# Patient Record
Sex: Male | Born: 1940 | Race: White | Hispanic: No | Smoking: Former smoker
Health system: Southern US, Community
[De-identification: ages and names within clinical notes are randomized; demographics above are authoritative.]

## PROBLEM LIST (undated history)

## (undated) DIAGNOSIS — N210 Calculus in bladder: Secondary | ICD-10-CM

## (undated) DIAGNOSIS — R011 Cardiac murmur, unspecified: Secondary | ICD-10-CM

## (undated) DIAGNOSIS — E119 Type 2 diabetes mellitus without complications: Secondary | ICD-10-CM

## (undated) DIAGNOSIS — I10 Essential (primary) hypertension: Secondary | ICD-10-CM

## (undated) HISTORY — DX: Calculus in bladder: N21.0

## (undated) HISTORY — DX: Essential (primary) hypertension: I10

## (undated) HISTORY — PX: CATARACT EXTRACTION, BILATERAL: SHX1313

## (undated) HISTORY — PX: EYE SURGERY: SHX253

## (undated) HISTORY — DX: Type 2 diabetes mellitus without complications: E11.9

---

## 1995-04-01 HISTORY — PX: CHOLECYSTECTOMY: SHX55

## 2004-03-21 LAB — HM COLONOSCOPY: HM COLON: NORMAL

## 2004-04-03 ENCOUNTER — Other Ambulatory Visit: Payer: Self-pay

## 2007-06-25 ENCOUNTER — Ambulatory Visit: Payer: Self-pay | Admitting: Family Medicine

## 2007-12-28 ENCOUNTER — Ambulatory Visit: Payer: Self-pay | Admitting: Family Medicine

## 2009-02-15 ENCOUNTER — Ambulatory Visit: Payer: Self-pay | Admitting: Family Medicine

## 2009-09-18 ENCOUNTER — Ambulatory Visit: Payer: Self-pay | Admitting: Family Medicine

## 2010-07-29 LAB — PSA: PSA: 4.6

## 2010-12-10 ENCOUNTER — Ambulatory Visit: Payer: Self-pay

## 2011-01-23 ENCOUNTER — Ambulatory Visit: Payer: Self-pay | Admitting: Anesthesiology

## 2011-01-28 ENCOUNTER — Ambulatory Visit: Payer: Self-pay | Admitting: General Surgery

## 2011-01-28 HISTORY — PX: LIPOMA EXCISION: SHX5283

## 2011-01-30 LAB — PATHOLOGY REPORT

## 2011-07-28 ENCOUNTER — Ambulatory Visit: Payer: Self-pay | Admitting: Family Medicine

## 2012-07-26 ENCOUNTER — Ambulatory Visit: Payer: Self-pay | Admitting: Family Medicine

## 2013-08-15 ENCOUNTER — Ambulatory Visit: Payer: Self-pay | Admitting: Family Medicine

## 2014-07-10 ENCOUNTER — Ambulatory Visit: Payer: Self-pay | Admitting: Gastroenterology

## 2014-07-11 LAB — PATHOLOGY REPORT

## 2014-08-29 ENCOUNTER — Ambulatory Visit: Payer: Self-pay | Admitting: Family Medicine

## 2014-12-28 DIAGNOSIS — I10 Essential (primary) hypertension: Secondary | ICD-10-CM | POA: Diagnosis not present

## 2014-12-28 DIAGNOSIS — E1140 Type 2 diabetes mellitus with diabetic neuropathy, unspecified: Secondary | ICD-10-CM | POA: Diagnosis not present

## 2015-01-13 DIAGNOSIS — E119 Type 2 diabetes mellitus without complications: Secondary | ICD-10-CM | POA: Diagnosis not present

## 2015-03-05 DIAGNOSIS — E119 Type 2 diabetes mellitus without complications: Secondary | ICD-10-CM | POA: Diagnosis not present

## 2015-03-05 DIAGNOSIS — I10 Essential (primary) hypertension: Secondary | ICD-10-CM | POA: Diagnosis not present

## 2015-04-14 DIAGNOSIS — E119 Type 2 diabetes mellitus without complications: Secondary | ICD-10-CM | POA: Diagnosis not present

## 2015-05-07 ENCOUNTER — Other Ambulatory Visit: Payer: Self-pay | Admitting: Family Medicine

## 2015-07-14 DIAGNOSIS — E119 Type 2 diabetes mellitus without complications: Secondary | ICD-10-CM | POA: Diagnosis not present

## 2015-08-02 ENCOUNTER — Other Ambulatory Visit: Payer: Self-pay | Admitting: Family Medicine

## 2015-08-20 ENCOUNTER — Encounter: Payer: Self-pay | Admitting: Family Medicine

## 2015-08-20 ENCOUNTER — Telehealth: Payer: Self-pay | Admitting: Family Medicine

## 2015-08-20 ENCOUNTER — Ambulatory Visit: Admission: RE | Admit: 2015-08-20 | Payer: Medicare Other | Source: Ambulatory Visit | Admitting: Family Medicine

## 2015-08-20 ENCOUNTER — Ambulatory Visit (INDEPENDENT_AMBULATORY_CARE_PROVIDER_SITE_OTHER): Payer: Medicare Other | Admitting: Family Medicine

## 2015-08-20 VITALS — BP 132/82 | HR 71 | Temp 97.9°F | Resp 16 | Wt 192.8 lb

## 2015-08-20 DIAGNOSIS — K219 Gastro-esophageal reflux disease without esophagitis: Secondary | ICD-10-CM | POA: Insufficient documentation

## 2015-08-20 DIAGNOSIS — J449 Chronic obstructive pulmonary disease, unspecified: Secondary | ICD-10-CM | POA: Insufficient documentation

## 2015-08-20 DIAGNOSIS — M4716 Other spondylosis with myelopathy, lumbar region: Secondary | ICD-10-CM | POA: Insufficient documentation

## 2015-08-20 DIAGNOSIS — J302 Other seasonal allergic rhinitis: Secondary | ICD-10-CM | POA: Insufficient documentation

## 2015-08-20 DIAGNOSIS — I10 Essential (primary) hypertension: Secondary | ICD-10-CM | POA: Insufficient documentation

## 2015-08-20 DIAGNOSIS — B1920 Unspecified viral hepatitis C without hepatic coma: Secondary | ICD-10-CM | POA: Insufficient documentation

## 2015-08-20 DIAGNOSIS — E119 Type 2 diabetes mellitus without complications: Secondary | ICD-10-CM | POA: Insufficient documentation

## 2015-08-20 DIAGNOSIS — R609 Edema, unspecified: Secondary | ICD-10-CM | POA: Insufficient documentation

## 2015-08-20 DIAGNOSIS — M25552 Pain in left hip: Secondary | ICD-10-CM

## 2015-08-20 DIAGNOSIS — M791 Myalgia, unspecified site: Secondary | ICD-10-CM | POA: Insufficient documentation

## 2015-08-20 DIAGNOSIS — J45909 Unspecified asthma, uncomplicated: Secondary | ICD-10-CM | POA: Insufficient documentation

## 2015-08-20 DIAGNOSIS — R011 Cardiac murmur, unspecified: Secondary | ICD-10-CM | POA: Insufficient documentation

## 2015-08-20 DIAGNOSIS — M545 Low back pain, unspecified: Secondary | ICD-10-CM | POA: Insufficient documentation

## 2015-08-20 DIAGNOSIS — Z8614 Personal history of Methicillin resistant Staphylococcus aureus infection: Secondary | ICD-10-CM | POA: Insufficient documentation

## 2015-08-20 NOTE — Telephone Encounter (Signed)
[facility] calling about a question on pt's order.  CB# [phone].  CC

## 2015-08-20 NOTE — Telephone Encounter (Signed)
Spoke with Baron Hamper, new X- Ray order sent.

## 2015-08-20 NOTE — Progress Notes (Signed)
Patient ID: MARQUET FAIRCLOTH, male   DOB: Jul 15, 1941, 74 y.o.   MRN: [id number] Name: [name]   MRN: [id number]    DOB: 1941/07/25   Date:08/20/2015       Progress Note  Subjective  Chief Complaint  Chief Complaint  Patient presents with  . Hip Pain    left hip X 10 days    Hip Pain  The incident occurred more than 1 week ago. There was no injury mechanism. The pain is present in the left hip. The pain has been constant since onset. The symptoms are aggravated by movement.   Patient Active Problem List   Diagnosis Date Noted  . LBP (low back pain) 08/20/2015  . CAFL (chronic airflow limitation) 08/20/2015  . Esophageal reflux 08/20/2015  . Cardiac murmur 08/20/2015  . 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 Personal history of methicillin resistant Staphylococcus aureus 08/20/2015  . Essential (primary) hypertension 08/20/2015  . Muscle ache 08/20/2015  . Edema, peripheral 08/20/2015  . Asthma 08/20/2015  . Allergic rhinitis, seasonal 08/20/2015  . 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 Diabetes mellitus, type 2 08/20/2015   History reviewed. No pertinent past medical history.  Social History  Substance Use Topics  . Smoking status: Former Research scientist (life sciences)  . Smokeless tobacco: Former Systems developer  . Alcohol Use: No  History reviewed. No pertinent family history.   Current outpatient prescriptions:  .  amLODipine (NORVASC) 10 MG tablet, Take by mouth., Disp: , Rfl:  .  aspirin 81 MG tablet, Take by mouth., Disp: , Rfl:  .  Cinnamon 500 MG capsule, Take by mouth., Disp: , Rfl:  .  glipiZIDE (GLUCOTROL XL) 2.5 MG 24 hr tablet, TAKE 1 TABLET BY MOUTH EVERY DAY, Disp: 90 tablet, Rfl: 0 .  losartan (COZAAR) 50 MG tablet, Take by mouth., Disp: , Rfl:   Allergies  Allergen Reactions  . Lisinopril Cough   Review of Systems  Constitutional: Negative.   HENT: Negative.   Eyes: Negative.   Respiratory: Negative.    Gastrointestinal: Negative.   Genitourinary: Negative.   Musculoskeletal: Positive for joint pain.  Skin: Negative.   Neurological: Negative.   Endo/Heme/Allergies: Negative.   Psychiatric/Behavioral: Negative.    Objective  Filed Vitals:   08/20/15 1058  BP: 132/82  Pulse: 71  Temp: 97.9 F (36.6 C)  TempSrc: Oral  Resp: 16  Weight: 192 lb 12.8 oz (87.454 kg)  SpO2: 96%   Physical Exam  Constitutional: He is well-developed, well-nourished, and in no distress.  HENT:  Head: Normocephalic and atraumatic.  Eyes: Conjunctivae are normal.  Cardiovascular: Normal rate and regular rhythm.   Pulmonary/Chest: Breath sounds normal.  Abdominal: Bowel sounds are normal.  Musculoskeletal:  Some tenderness in the left lower back and left hip region. Stiffness of back and favors left hip when walking. Unable to stand completely erect due to degenerative disease in lower back. No crepitus in knees. No tenderness over the trochanteric bursae. Increased pain in the left hip to try to put left ankle on right knee.   Assessment & Plan 1. Left hip pain Onset over the past 10 days. No known injury or fall. No pain to sit. Pain sharp with standing and walking. Some relief from heating pad and Aleve. Recommend using Aleve BID for 2 weeks and get x-ray evaluation of the left hip. May need orthopedic referral for cortisone injection pending x-ray report. Encouraged to use a cane for support when  walking. Patient agreed. - DG Arthro Hip Left

## 2015-08-21 ENCOUNTER — Ambulatory Visit
Admission: RE | Admit: 2015-08-21 | Discharge: 2015-08-21 | Disposition: A | Discharge status: Home / Self Care | Payer: Medicare Other | Source: Ambulatory Visit | Attending: Family Medicine | Admitting: Family Medicine

## 2015-08-21 ENCOUNTER — Telehealth: Payer: Self-pay

## 2015-08-21 DIAGNOSIS — M25552 Pain in left hip: Secondary | ICD-10-CM | POA: Diagnosis not present

## 2015-08-21 DIAGNOSIS — M160 Bilateral primary osteoarthritis of hip: Secondary | ICD-10-CM | POA: Diagnosis not present

## 2015-08-21 NOTE — Telephone Encounter (Signed)
Patient advised as directed below. Patient verbalized understanding and agrees with treatment plan. 

## 2015-08-21 NOTE — Telephone Encounter (Signed)
-----   Message from Margo Common, [place] sent at 08/21/2015 12:27 PM EDT ----- X-rays confirm arthritis/degenerative disease in lumbar spine and both hips. Use Aleve BID as discussed and recheck prn. May need different medication or referral to an orthopedist, if no better in 2 weeks.

## 2015-10-13 DIAGNOSIS — E119 Type 2 diabetes mellitus without complications: Secondary | ICD-10-CM | POA: Diagnosis not present

## 2015-10-31 ENCOUNTER — Other Ambulatory Visit: Payer: Self-pay | Admitting: Family Medicine

## 2015-10-31 DIAGNOSIS — E119 Type 2 diabetes mellitus without complications: Secondary | ICD-10-CM

## 2015-11-06 ENCOUNTER — Encounter: Payer: Self-pay | Admitting: Family Medicine

## 2015-11-06 ENCOUNTER — Ambulatory Visit (INDEPENDENT_AMBULATORY_CARE_PROVIDER_SITE_OTHER): Payer: Medicare Other | Admitting: Family Medicine

## 2015-11-06 VITALS — BP 136/82 | HR 78 | Temp 97.9°F | Resp 16 | Wt 193.6 lb

## 2015-11-06 DIAGNOSIS — E1100 Type 2 diabetes mellitus with hyperosmolarity without nonketotic hyperglycemic-hyperosmolar coma (NKHHC): Secondary | ICD-10-CM | POA: Diagnosis not present

## 2015-11-06 DIAGNOSIS — I10 Essential (primary) hypertension: Secondary | ICD-10-CM | POA: Diagnosis not present

## 2015-11-06 DIAGNOSIS — M1612 Unilateral primary osteoarthritis, left hip: Secondary | ICD-10-CM | POA: Diagnosis not present

## 2015-11-06 DIAGNOSIS — M4716 Other spondylosis with myelopathy, lumbar region: Secondary | ICD-10-CM

## 2015-11-06 DIAGNOSIS — M169 Osteoarthritis of hip, unspecified: Secondary | ICD-10-CM | POA: Insufficient documentation
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
## 2015-11-07 LAB — CBC WITH DIFFERENTIAL/PLATELET
BASOS: 1 %
Basophils Absolute: 0.1 10*3/uL (ref 0.0–0.2)
EOS (ABSOLUTE): 0.2 10*3/uL (ref 0.0–0.4)
EOS: 2 %
HEMATOCRIT: 45.1 % (ref 37.5–51.0)
HEMOGLOBIN: 15.4 g/dL (ref 12.6–17.7)
IMMATURE GRANS (ABS): 0.1 10*3/uL (ref 0.0–0.1)
Immature Granulocytes: 1 %
LYMPHS: 20 %
Lymphocytes Absolute: 2.1 10*3/uL (ref 0.7–3.1)
MCH: 31.2 pg (ref 26.6–33.0)
MCHC: 34.1 g/dL (ref 31.5–35.7)
MCV: 92 fL (ref 79–97)
MONOCYTES: 6 %
Monocytes Absolute: 0.6 10*3/uL (ref 0.1–0.9)
NEUTROS ABS: 7.3 10*3/uL — AB (ref 1.4–7.0)
Neutrophils: 70 %
Platelets: 219 10*3/uL (ref 150–379)
RBC: 4.93 x10E6/uL (ref 4.14–5.80)
RDW: 12.2 % — ABNORMAL LOW (ref 12.3–15.4)
WBC: 10.3 10*3/uL (ref 3.4–10.8)

## 2015-11-07 LAB — HEMOGLOBIN A1C
Est. average glucose Bld gHb Est-mCnc: 114 mg/dL
HEMOGLOBIN A1C: 5.6 % (ref 4.8–5.6)

## 2015-11-07 LAB — LIPID PANEL
CHOL/HDL RATIO: 3.3 ratio (ref 0.0–5.0)
Cholesterol, Total: 104 mg/dL (ref 100–199)
HDL: 32 mg/dL — AB (ref >39)
LDL Calculated: 56 mg/dL (ref 0–99)
TRIGLYCERIDES: 81 mg/dL (ref 0–149)
VLDL CHOLESTEROL CAL: 16 mg/dL (ref 5–40)

## 2015-11-07 LAB — TSH: TSH: 1.8 u[IU]/mL (ref 0.450–4.500)

## 2015-11-08 ENCOUNTER — Telehealth: Payer: Self-pay

## 2015-11-08 NOTE — Telephone Encounter (Signed)
-----   Message from Margo Common, [place] sent at 11/08/2015  8:46 AM EST ----- Very good blood sugar control. Hgb A1C 5.6 and normal thyroid. Cholesterol and triglycerides in very good shape. Continue present medications. Awaiting final report of CMP.

## 2015-11-08 NOTE — Telephone Encounter (Signed)
Patient advised as directed below. Patient verbalized understanding.  

## 2015-12-04 LAB — HM DIABETES EYE EXAM

## 2016-01-21 ENCOUNTER — Other Ambulatory Visit: Payer: Self-pay | Admitting: Family Medicine

## 2016-02-25 ENCOUNTER — Encounter: Payer: Self-pay | Admitting: Family Medicine

## 2016-02-25 ENCOUNTER — Ambulatory Visit (INDEPENDENT_AMBULATORY_CARE_PROVIDER_SITE_OTHER): Payer: PPO | Admitting: Family Medicine

## 2016-02-25 VITALS — BP 132/84 | HR 67 | Temp 98.3°F | Resp 14 | Wt 186.2 lb

## 2016-02-25 DIAGNOSIS — M1612 Unilateral primary osteoarthritis, left hip: Secondary | ICD-10-CM

## 2016-02-25 DIAGNOSIS — L03114 Cellulitis of left upper limb: Secondary | ICD-10-CM

## 2016-02-25 DIAGNOSIS — E1100 Type 2 diabetes mellitus with hyperosmolarity without nonketotic hyperglycemic-hyperosmolar coma (NKHHC): Secondary | ICD-10-CM

## 2016-02-25 DIAGNOSIS — I10 Essential (primary) hypertension: Secondary | ICD-10-CM | POA: Diagnosis not present

## 2016-02-25 MED ORDER — DOXYCYCLINE HYCLATE 100 MG PO TABS: 100.0000 mg | ORAL_TABLET | Freq: Two times a day (BID) | ORAL | Status: DC

## 2016-02-25 MED ORDER — GLUCOSE BLOOD VI STRP: ORAL_STRIP | Status: DC

## 2016-02-25 NOTE — Progress Notes (Signed)
Patient ID: [name], male   DOB: 12-04-1940, 75 y.o.   MRN: [id number]   Patient: [name] Male    DOB: Jan 10, 1941   75 y.o.   MRN: [id number] Visit Date: 02/25/2016  Today's Provider: [name], PA   Chief Complaint  Patient presents with  . Hand Pain   Subjective:    Hand Pain  The incident occurred 3 to 5 days ago. There was no injury mechanism. Pain location: left index and middle fingers. The pain does not radiate. The pain has been intermittent since the incident. Associated symptoms comments: Redness and swelling . Nothing aggravates the symptoms. He has tried nothing for the symptoms.    Patient Active Problem List   Diagnosis Date Noted  . 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 LBP (low back pain) 08/20/2015  . CAFL (chronic airflow limitation) ([place]) 08/20/2015  . Esophageal reflux 08/20/2015  . Cardiac murmur 08/20/2015  . 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 Personal history of methicillin resistant Staphylococcus aureus 08/20/2015  . Essential (primary) hypertension 08/20/2015  . Muscle ache 08/20/2015  . Edema, peripheral 08/20/2015  . Asthma 08/20/2015  . Allergic rhinitis, seasonal 08/20/2015  . 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 Diabetes mellitus, type 2 ([place]) 08/20/2015   Past Surgical History  Procedure Laterality Date  . Cholecystectomy  04/1995  . Lipoma excision  01/28/2011   History reviewed. No pertinent family history.   Previous Medications   AMLODIPINE (NORVASC) 10 MG TABLET    TAKE 1 TABLET BY MOUTH ONCE DAILY   ASPIRIN 81 MG TABLET    Take by mouth.   CINNAMON 500 MG CAPSULE    Take by mouth.   GLIPIZIDE (GLUCOTROL XL) 2.5 MG 24 HR TABLET    TAKE 1 TABLET BY MOUTH EVERY DAY   GLUCOSE BLOOD TEST STRIP       LOSARTAN (COZAAR) 50 MG TABLET    TAKE 1 TABLET BY MOUTH ONCE DAILY   Allergies  Allergen Reactions  . Lisinopril Cough    Review of  Systems  Constitutional: Negative.   HENT: Negative.   Eyes: Negative.   Respiratory: Negative.   Cardiovascular: Negative.   Gastrointestinal: Negative.   Endocrine: Negative.   Genitourinary: Negative.   Musculoskeletal: Positive for joint swelling and arthralgias.  Skin: Negative.   Allergic/Immunologic: Negative.   Neurological: Negative.   Hematological: Negative.   Psychiatric/Behavioral: Negative.     Social History  Substance Use Topics  . Smoking status: Former Research scientist (life sciences)  . Smokeless tobacco: Former Systems developer  . Alcohol Use: No   Objective:   BP 132/84 mmHg  Pulse 67  Temp(Src) 98.3 F (36.8 C) (Oral)  Resp 14  Wt 186 lb 3.2 oz (84.46 kg) Wt Readings from Last 3 Encounters:  02/25/16 186 lb 3.2 oz (84.46 kg)  11/06/15 193 lb 9.6 oz (87.816 kg)  08/20/15 192 lb 12.8 oz (87.454 kg)     Physical Exam  Constitutional: He is oriented to person, place, and time. He appears well-developed and well-nourished. No distress.  HENT:  Head: Normocephalic and atraumatic.  Right Ear: Hearing normal.  Left Ear: Hearing normal.  Nose: Nose normal.  Eyes: Conjunctivae and lids are normal. Right eye exhibits no discharge. Left eye exhibits no discharge. No scleral icterus.  Pulmonary/Chest: Effort normal. No respiratory distress.  Musculoskeletal: He exhibits tenderness.  Neurological: He is alert and oriented to person, place, and time.  Skin:  Skin is intact. No lesion and no rash noted.  Psychiatric: He has a normal mood and affect. His speech is normal and behavior is normal. Thought content normal.      Assessment & Plan:     1. Cellulitis of left hand Onset over the past 3-4 days. No specific injury known. Doesn't remember and insect sting or bite. Left index finger red and swollen with dark central spot over the proximal phalange. No fever or drainage. Continue hot Epsom saltwater soaks and given antibiotic. Will check CBC and follow up pending report. - CBC with  Differential/Platelet - doxycycline (VIBRA-TABS) 100 MG tablet; Take 1 tablet (100 mg total) by mouth 2 (two) times daily.  Dispense: 20 tablet; Refill: 0  2. Osteoarthritis of left hip, unspecified osteoarthritis type Chronic ache and difficulty walking. Uses a can and has been very active (loves to fish). Continues to use Aleve occasionally when needed.  3. Essential (primary) hypertension Stable and well controlled without side effects. Continue Amlodipine 10 mg qd and Cozaar 50 mg qd. Recheck labs and follow up pending reports.  4. Type 2 diabetes mellitus with hyperosmolarity without coma, without long-term current use of insulin ([place]) Has lost 7 lbs since last office visit. FBS ranging from 80-100 recently. Had eye exam 12-04-15 without evidence of diabetic retinopathy. Will refill glucometer test strips and get follow up labs. May need to stop Glipizide 2.5 mg qd if FBS stays below 100. Recheck pending lab reports. - glucose blood test strip; Test fasting blood sugar daily.  Dispense: 100 each; Refill: 4 - Comprehensive metabolic panel - Hemoglobin A1c

## 2016-02-25 NOTE — Patient Instructions (Signed)

## 2016-02-26 LAB — CBC WITH DIFFERENTIAL/PLATELET
BASOS: 1 %
Basophils Absolute: 0 10*3/uL (ref 0.0–0.2)
EOS (ABSOLUTE): 0.1 10*3/uL (ref 0.0–0.4)
EOS: 2 %
HEMATOCRIT: 45.4 % (ref 37.5–51.0)
Hemoglobin: 15.4 g/dL (ref 12.6–17.7)
Immature Grans (Abs): 0 10*3/uL (ref 0.0–0.1)
Immature Granulocytes: 0 %
LYMPHS ABS: 1.4 10*3/uL (ref 0.7–3.1)
Lymphs: 22 %
MCH: 31.2 pg (ref 26.6–33.0)
MCHC: 33.9 g/dL (ref 31.5–35.7)
MCV: 92 fL (ref 79–97)
MONOS ABS: 0.6 10*3/uL (ref 0.1–0.9)
Monocytes: 9 %
Neutrophils Absolute: 4.3 10*3/uL (ref 1.4–7.0)
Neutrophils: 66 %
Platelets: 194 10*3/uL (ref 150–379)
RBC: 4.94 x10E6/uL (ref 4.14–5.80)
RDW: 13.1 % (ref 12.3–15.4)
WBC: 6.5 10*3/uL (ref 3.4–10.8)

## 2016-02-26 LAB — COMPREHENSIVE METABOLIC PANEL
ALBUMIN: 4 g/dL (ref 3.5–4.8)
ALT: 25 IU/L (ref 0–44)
AST: 17 IU/L (ref 0–40)
Albumin/Globulin Ratio: 1.9 (ref 1.2–2.2)
Alkaline Phosphatase: 100 IU/L (ref 39–117)
BILIRUBIN TOTAL: 0.9 mg/dL (ref 0.0–1.2)
BUN / CREAT RATIO: 27 — AB (ref 10–22)
BUN: 17 mg/dL (ref 8–27)
CHLORIDE: 101 mmol/L (ref 96–106)
CO2: 25 mmol/L (ref 18–29)
CREATININE: 0.63 mg/dL — AB (ref 0.76–1.27)
Calcium: 9.1 mg/dL (ref 8.6–10.2)
GFR calc non Af Amer: 96 mL/min/{1.73_m2} (ref >59)
GFR, EST AFRICAN AMERICAN: 111 mL/min/{1.73_m2} (ref >59)
GLUCOSE: 120 mg/dL — AB (ref 65–99)
Globulin, Total: 2.1 g/dL (ref 1.5–4.5)
Potassium: 4.1 mmol/L (ref 3.5–5.2)
Sodium: 140 mmol/L (ref 134–144)
TOTAL PROTEIN: 6.1 g/dL (ref 6.0–8.5)

## 2016-02-26 LAB — HEMOGLOBIN A1C
Est. average glucose Bld gHb Est-mCnc: 108 mg/dL
Hgb A1c MFr Bld: 5.4 % (ref 4.8–5.6)

## 2016-03-03 NOTE — Progress Notes (Signed)
Patient advised as directed. Patient verbalized understanding. Patient states his hand is feeling much better so he doesn't want to schedule a follow up appointment at this time. Patient states he will call back for a appointment if needed.

## 2016-04-15 ENCOUNTER — Ambulatory Visit (INDEPENDENT_AMBULATORY_CARE_PROVIDER_SITE_OTHER): Payer: PPO | Admitting: Family Medicine

## 2016-04-15 ENCOUNTER — Encounter: Payer: Self-pay | Admitting: Family Medicine

## 2016-04-15 VITALS — BP 136/88 | HR 63 | Temp 97.9°F | Resp 14 | Wt 182.8 lb

## 2016-04-15 DIAGNOSIS — R197 Diarrhea, unspecified: Secondary | ICD-10-CM | POA: Diagnosis not present

## 2016-04-15 DIAGNOSIS — R110 Nausea: Secondary | ICD-10-CM

## 2016-04-15 NOTE — Progress Notes (Signed)
Patient ID: DUSTN GARDOCKI, male   DOB: [birth year], 75 y.o.   MRN: [id number]   Patient: [name] Male    DOB: 08-Jul-1941   75 y.o.   MRN: [id number] Visit Date: 04/15/2016  Today's Provider: [name], PA   Chief Complaint  Patient presents with  . Diarrhea  . Nausea   Subjective:    Diarrhea  This is a new problem. Episode onset: Thursday. Episode frequency: 4-5 times per day. The problem has been gradually improving. The stool consistency is described as watery. The patient states that diarrhea does not awaken him from sleep. Associated symptoms comments: Nausea . Treatments tried: pepto bismuth. The treatment provided no relief.    History reviewed. No pertinent past medical history. Patient Active Problem List   Diagnosis Date Noted  . 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 LBP (low back pain) 08/20/2015  . CAFL (chronic airflow limitation) ([place]) 08/20/2015  . Esophageal reflux 08/20/2015  . Cardiac murmur 08/20/2015  . 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 Personal history of methicillin resistant Staphylococcus aureus 08/20/2015  . Essential (primary) hypertension 08/20/2015  . Muscle ache 08/20/2015  . Edema, peripheral 08/20/2015  . Asthma 08/20/2015  . Allergic rhinitis, seasonal 08/20/2015  . 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 Diabetes mellitus, type 2 ([place]) 08/20/2015   Past Surgical History  Procedure Laterality Date  . Cholecystectomy  04/1995  . Lipoma excision  01/28/2011   History reviewed. No pertinent family history.  Previous Medications   AMLODIPINE (NORVASC) 10 MG TABLET    TAKE 1 TABLET BY MOUTH ONCE DAILY   ASPIRIN 81 MG TABLET    Take by mouth.   CINNAMON 500 MG CAPSULE    Take by mouth.   DOXYCYCLINE (VIBRA-TABS) 100 MG TABLET    Take 1 tablet (100 mg total) by mouth 2 (two) times daily.   GLIPIZIDE (GLUCOTROL XL) 2.5 MG 24 HR TABLET    TAKE 1 TABLET BY MOUTH  EVERY DAY   GLUCOSE BLOOD TEST STRIP    Test fasting blood sugar daily.   LOSARTAN (COZAAR) 50 MG TABLET    TAKE 1 TABLET BY MOUTH ONCE DAILY   Allergies  Allergen Reactions  . Lisinopril Cough   Review of Systems  Constitutional: Negative.   HENT: Negative.   Eyes: Negative.   Respiratory: Negative.   Cardiovascular: Negative.   Gastrointestinal: Positive for nausea and diarrhea.  Endocrine: Negative.   Genitourinary: Negative.   Musculoskeletal: Negative.   Skin: Negative.   Allergic/Immunologic: Negative.   Neurological: Negative.   Hematological: Negative.   Psychiatric/Behavioral: Negative.     Social History  Substance Use Topics  . Smoking status: Former Research scientist (life sciences)  . Smokeless tobacco: Former Systems developer  . Alcohol Use: No   Objective:   BP 136/88 mmHg  Pulse 63  Temp(Src) 97.9 F (36.6 C) (Oral)  Resp 14  Wt 182 lb 12.8 oz (82.918 kg)  SpO2 98% Body mass index is 28.62 kg/(m^2).  Wt Readings from Last 3 Encounters:  04/15/16 182 lb 12.8 oz (82.918 kg)  02/25/16 186 lb 3.2 oz (84.46 kg)  11/06/15 193 lb 9.6 oz (87.816 kg)    Physical Exam  Constitutional: He is oriented to person, place, and time. He appears well-developed and well-nourished.  HENT:  Head: Normocephalic.  Eyes: Conjunctivae and EOM are normal.  Neck: Neck supple.  Cardiovascular: Normal rate and regular rhythm.   Pulmonary/Chest: Effort normal  and breath sounds normal.  Abdominal: Soft. Bowel sounds are normal. He exhibits no mass. There is no tenderness.  Neurological: He is alert and oriented to person, place, and time.  Skin: No rash noted.  Psychiatric: He has a normal mood and affect. His behavior is normal.      Assessment & Plan:     1. Diarrhea, unspecified type Onset over the past 3-4 days with watery diarrhea 4-5 times a day. Usually triggered by eating or drinking something. No abdominal pains, fever or vomiting. May use Imodium-AD and add Probiotic supplement. Recommend bland  diet with increased fluids. Watch diabetes closely for any hypoglycemia. May hold Glipizide if FBS below 100. - CBC with Differential/Platelet - Comprehensive metabolic panel  2. Nausea Onset 04-11-16 with diarrhea. No blood in stools and no vomiting. Has tried Pepto-Bismol and noticed some dark/black stools after using it. May use Bonine prn nausea and increase fluid intake. Recheck if no better in 3 days. Will check labs for signs of infection or significant dehydration. Should rest at home and limit exposure to heat (forcast for 90 degrees the next few days). - CBC with Differential/Platelet - Comprehensive metabolic panel

## 2016-04-16 LAB — CBC WITH DIFFERENTIAL/PLATELET
BASOS: 1 %
Basophils Absolute: 0 10*3/uL (ref 0.0–0.2)
EOS (ABSOLUTE): 0.1 10*3/uL (ref 0.0–0.4)
EOS: 2 %
HEMATOCRIT: 46.8 % (ref 37.5–51.0)
HEMOGLOBIN: 15.7 g/dL (ref 12.6–17.7)
IMMATURE GRANULOCYTES: 0 %
Immature Grans (Abs): 0 10*3/uL (ref 0.0–0.1)
LYMPHS ABS: 1.9 10*3/uL (ref 0.7–3.1)
Lymphs: 33 %
MCH: 31.2 pg (ref 26.6–33.0)
MCHC: 33.5 g/dL (ref 31.5–35.7)
MCV: 93 fL (ref 79–97)
MONOCYTES: 7 %
MONOS ABS: 0.4 10*3/uL (ref 0.1–0.9)
NEUTROS PCT: 57 %
Neutrophils Absolute: 3.3 10*3/uL (ref 1.4–7.0)
Platelets: 216 10*3/uL (ref 150–379)
RBC: 5.04 x10E6/uL (ref 4.14–5.80)
RDW: 13 % (ref 12.3–15.4)
WBC: 5.7 10*3/uL (ref 3.4–10.8)

## 2016-04-16 LAB — COMPREHENSIVE METABOLIC PANEL
A/G RATIO: 1.9 (ref 1.2–2.2)
ALBUMIN: 3.9 g/dL (ref 3.5–4.8)
ALT: 20 IU/L (ref 0–44)
AST: 14 IU/L (ref 0–40)
Alkaline Phosphatase: 79 IU/L (ref 39–117)
BUN / CREAT RATIO: 17 (ref 10–24)
BUN: 11 mg/dL (ref 8–27)
Bilirubin Total: 0.7 mg/dL (ref 0.0–1.2)
CALCIUM: 8.8 mg/dL (ref 8.6–10.2)
CHLORIDE: 102 mmol/L (ref 96–106)
CO2: 25 mmol/L (ref 18–29)
CREATININE: 0.63 mg/dL — AB (ref 0.76–1.27)
GFR, EST AFRICAN AMERICAN: 111 mL/min/{1.73_m2} (ref >59)
GFR, EST NON AFRICAN AMERICAN: 96 mL/min/{1.73_m2} (ref >59)
GLOBULIN, TOTAL: 2.1 g/dL (ref 1.5–4.5)
Glucose: 112 mg/dL — ABNORMAL HIGH (ref 65–99)
Potassium: 3.8 mmol/L (ref 3.5–5.2)
Sodium: 143 mmol/L (ref 134–144)
Total Protein: 6 g/dL (ref 6.0–8.5)

## 2016-06-13 ENCOUNTER — Encounter: Payer: Self-pay | Admitting: Family Medicine

## 2016-06-13 ENCOUNTER — Ambulatory Visit
Admission: RE | Admit: 2016-06-13 | Discharge: 2016-06-13 | Disposition: A | Discharge status: Home / Self Care | Payer: PPO | Source: Ambulatory Visit | Attending: Family Medicine | Admitting: Family Medicine

## 2016-06-13 ENCOUNTER — Ambulatory Visit (INDEPENDENT_AMBULATORY_CARE_PROVIDER_SITE_OTHER): Payer: PRIVATE HEALTH INSURANCE | Admitting: Family Medicine

## 2016-06-13 ENCOUNTER — Ambulatory Visit: Payer: Self-pay | Admitting: Family Medicine

## 2016-06-13 VITALS — BP 132/60 | HR 60 | Temp 98.2°F | Resp 16 | Wt 183.0 lb

## 2016-06-13 DIAGNOSIS — S82492A Other fracture of shaft of left fibula, initial encounter for closed fracture: Secondary | ICD-10-CM | POA: Insufficient documentation

## 2016-06-13 DIAGNOSIS — S80812A Abrasion, left lower leg, initial encounter: Secondary | ICD-10-CM

## 2016-06-13 DIAGNOSIS — S8782XA Crushing injury of left lower leg, initial encounter: Secondary | ICD-10-CM | POA: Diagnosis present

## 2016-06-13 DIAGNOSIS — Z23 Encounter for immunization: Secondary | ICD-10-CM
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
## 2016-06-16 ENCOUNTER — Encounter: Payer: Self-pay | Admitting: Family Medicine

## 2016-06-16 ENCOUNTER — Ambulatory Visit (INDEPENDENT_AMBULATORY_CARE_PROVIDER_SITE_OTHER): Payer: PRIVATE HEALTH INSURANCE | Admitting: Family Medicine

## 2016-06-16 VITALS — BP 124/70 | HR 72 | Temp 97.9°F | Resp 16 | Wt 183.0 lb

## 2016-06-16 DIAGNOSIS — S8782XD Crushing injury of left lower leg, subsequent encounter: Secondary | ICD-10-CM

## 2016-06-16 DIAGNOSIS — S82402D Unspecified fracture of shaft of left fibula, subsequent encounter for closed fracture with routine healing: Secondary | ICD-10-CM

## 2016-06-16 DIAGNOSIS — S81812D Laceration without foreign body, left lower leg, subsequent encounter: Secondary | ICD-10-CM
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
## 2016-06-17 ENCOUNTER — Telehealth: Payer: Self-pay | Admitting: Family Medicine

## 2016-06-17 MED ORDER — TRAMADOL HCL 50 MG PO TABS: 50.0000 mg | ORAL_TABLET | Freq: Three times a day (TID) | ORAL | Status: DC | PRN

## 2016-06-17 NOTE — Telephone Encounter (Signed)
Pt states he was seen yesterday for foot pain.  Pt is requesting a Rx to help with the pain.  [facility].  [phone]

## 2016-06-17 NOTE — Telephone Encounter (Signed)
Please review. Thanks!  
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
## 2016-06-18 NOTE — Telephone Encounter (Signed)
Called in Rx as below into the pharmacy. Patient was advised.

## 2016-06-19 ENCOUNTER — Encounter: Payer: Self-pay | Admitting: Family Medicine

## 2016-06-19 ENCOUNTER — Ambulatory Visit (INDEPENDENT_AMBULATORY_CARE_PROVIDER_SITE_OTHER): Payer: PRIVATE HEALTH INSURANCE | Admitting: Family Medicine

## 2016-06-19 VITALS — BP 122/70 | HR 68 | Temp 97.8°F | Resp 16

## 2016-06-19 DIAGNOSIS — S8782XD Crushing injury of left lower leg, subsequent encounter: Secondary | ICD-10-CM

## 2016-06-19 DIAGNOSIS — T148 Other injury of unspecified body region: Secondary | ICD-10-CM

## 2016-06-19 DIAGNOSIS — L089 Local infection of the skin and subcutaneous tissue, unspecified: Secondary | ICD-10-CM

## 2016-06-19 DIAGNOSIS — T148XXA Other injury of unspecified body region, initial encounter: Principal | ICD-10-CM

## 2016-06-19 DIAGNOSIS — S82402D Unspecified fracture of shaft of left fibula, subsequent encounter for closed fracture with routine healing: Secondary | ICD-10-CM

## 2016-06-19 MED ORDER — DOXYCYCLINE HYCLATE 100 MG PO TABS: 100.0000 mg | ORAL_TABLET | Freq: Two times a day (BID) | ORAL | Status: DC
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
## 2016-06-21 LAB — WOUND CULTURE

## 2016-06-23 ENCOUNTER — Ambulatory Visit: Payer: Self-pay | Admitting: Family Medicine

## 2016-06-24 ENCOUNTER — Encounter: Payer: PPO | Attending: Internal Medicine | Admitting: Internal Medicine

## 2016-06-24 DIAGNOSIS — X58XXXD Exposure to other specified factors, subsequent encounter: Secondary | ICD-10-CM | POA: Diagnosis not present

## 2016-06-24 DIAGNOSIS — M1990 Unspecified osteoarthritis, unspecified site: Secondary | ICD-10-CM | POA: Insufficient documentation

## 2016-06-24 DIAGNOSIS — Z87891 Personal history of nicotine dependence: Secondary | ICD-10-CM | POA: Insufficient documentation

## 2016-06-24 DIAGNOSIS — I10 Essential (primary) hypertension: Secondary | ICD-10-CM | POA: Diagnosis not present

## 2016-06-24 DIAGNOSIS — J45909 Unspecified asthma, uncomplicated: Secondary | ICD-10-CM | POA: Insufficient documentation

## 2016-06-24 DIAGNOSIS — K219 Gastro-esophageal reflux disease without esophagitis: Secondary | ICD-10-CM | POA: Insufficient documentation

## 2016-06-24 DIAGNOSIS — S81812D Laceration without foreign body, left lower leg, subsequent encounter: Secondary | ICD-10-CM | POA: Diagnosis not present

## 2016-06-24 DIAGNOSIS — E119 Type 2 diabetes mellitus without complications: Secondary | ICD-10-CM | POA: Insufficient documentation

## 2016-06-24 DIAGNOSIS — B1920 Unspecified viral hepatitis C without hepatic coma: Secondary | ICD-10-CM | POA: Insufficient documentation

## 2016-06-24 DIAGNOSIS — Z7984 Long term (current) use of oral hypoglycemic drugs: Secondary | ICD-10-CM | POA: Diagnosis not present

## 2016-06-24 DIAGNOSIS — M84464D Pathological fracture, left fibula, subsequent encounter for fracture with routine healing: Secondary | ICD-10-CM | POA: Diagnosis not present

## 2016-06-25 NOTE — Progress Notes (Signed)
[name] ([id number]) Visit Report for 06/24/2016 Allergy List Details Patient Name: [name], [name] Date of Service: 06/24/2016 9:30 AM Medical Record Number: [id number] Patient Account Number: [id number] Date of Birth/Sex: 15-Feb-1941 (75 y.o. Male) Treating RN: Ahmed Prima Primary Care Physician: [name] Other Clinician: Referring Physician: [name] Treating Physician/Extender: [name] Weeks in Treatment: 0 Allergies Active Allergies lisinopril Reaction: cough Severity: Severe Allergy Notes Electronic Signature(s) Signed: 06/24/2016 3:59:42 PM By: Alric Quan Entered By: Alric Quan on 06/24/2016 10:16:39 Murlean Caller ([id number]) -------------------------------------------------------------------------------- Arrival Information Details Patient Name: [name] T. Date of Service: 06/24/2016 9:30 AM Medical Record Number: [id number] Patient Account Number: [id number] Date of Birth/Sex: 30-May-1941 (75 y.o. Male) Treating RN: Ahmed Prima Primary Care Physician: [name] Other Clinician: Referring Physician: [name] Treating Physician/Extender: [name] in Treatment: 0 Visit Information Patient Arrived: Wheel Chair Arrival Time: 09:40 Accompanied By: self Transfer Assistance: EasyPivot Patient Lift Patient Identification Verified: Yes Secondary Verification Process Yes Completed: Patient Requires Transmission- No Based Precautions: Patient Has Alerts: Yes Patient Alerts: DM II HEP C+ NO L ABI d/t pain Electronic Signature(s) Signed: 06/24/2016 3:59:42 PM By: Alric Quan Entered By: Alric Quan on 06/24/2016 10:05:09 Murlean Caller ([id number]) -------------------------------------------------------------------------------- Clinic Level of Care Assessment Details Patient Name: [name] T. Date of Service: 06/24/2016 9:30 AM Medical Record Number:  [id number] Patient Account Number: [id number] Date of Birth/Sex: 09-Jul-1941 (75 y.o. Male) Treating RN: Carolyne Fiscal, Debi Primary Care Physician: [name] Other Clinician: Referring Physician: [name] Treating Physician/Extender: [name] Weeks in Treatment: 0 Clinic Level of Care Assessment Items TOOL 1 Quantity Score X - Use when EandM and Procedure is performed on INITIAL visit 1 0 ASSESSMENTS - Nursing Assessment / Reassessment X - General Physical Exam (combine w/ comprehensive assessment (listed just 1 20 below) when performed on new pt. evals) X - Comprehensive Assessment (HX, ROS, Risk Assessments, Wounds Hx, etc.) 1 25 ASSESSMENTS - Wound and Skin Assessment / Reassessment []  - Dermatologic / Skin Assessment (not related to wound area) 0 ASSESSMENTS - Ostomy and/or Continence Assessment and Care []  - Incontinence Assessment and Management 0 []  - Ostomy Care Assessment and Management (repouching, etc.) 0 PROCESS - Coordination of Care []  - Simple Patient / Family Education for ongoing care 0 X - Complex (extensive) Patient / Family Education for ongoing care 1 20 X - Staff obtains Programmer, systems, Records, Test Results / Process Orders 1 10 []  - Staff telephones HHA, Nursing Homes / Clarify orders / etc 0 []  - Routine Transfer to another Facility (non-emergent condition) 0 []  - Routine Hospital Admission (non-emergent condition) 0 X - New Admissions / Biomedical engineer / Ordering NPWT, Apligraf, etc. 1 15 []  - Emergency Hospital Admission (emergent condition) 0 PROCESS - Special Needs []  - Pediatric / Minor Patient Management 0 []  - Isolation Patient Management 0 Kobel, [name] ([id number]) []  - Hearing / Language / Visual special needs 0 []  - Assessment of Community assistance (transportation, D/C planning, etc.) 0 []  - Additional assistance / Altered mentation 0 []  - Support Surface(s) Assessment (bed, cushion, seat, etc.) 0 INTERVENTIONS -  Miscellaneous []  - External ear exam 0 X - Patient Transfer (multiple staff / Civil Service fast streamer / Similar devices) 1 10 []  - Simple Staple / Suture removal (25 or less) 0 []  - Complex Staple / Suture removal (26 or more) 0 []  - Hypo/Hyperglycemic Management (do not check if billed separately) 0 []  - Ankle / Brachial Index (ABI) - do  not check if billed separately 0 Has the patient been seen at the hospital within the last three years: Yes Total Score: 100 Level Of Care: New/Established - Level 3 Electronic Signature(s) Signed: 06/24/2016 3:59:42 PM By: Alric Quan Entered By: Alric Quan on 06/24/2016 11:31:23 Murlean Caller ([id number]) -------------------------------------------------------------------------------- Encounter Discharge Information Details Patient Name: [name] T. Date of Service: 06/24/2016 9:30 AM Medical Record Number: [id number] Patient Account Number: [id number] Date of Birth/Sex: 1941/02/07 (75 y.o. Male) Treating RN: Ahmed Prima Primary Care Physician: [name] Other Clinician: Referring Physician: [name] Treating Physician/Extender: [name] in Treatment: 0 Encounter Discharge Information Items Discharge Pain Level: 0 Discharge Condition: Stable Ambulatory Status: Wheelchair Discharge Destination: Home Transportation: Private Auto Accompanied By: self Schedule Follow-up Appointment: Yes Medication Reconciliation completed and provided to Patient/Care No [name]: Provided on Clinical Summary of Care: 06/24/2016 Form Type Recipient Paper Patient RR Electronic Signature(s) Signed: 06/24/2016 11:07:10 AM By: Ruthine Dose Entered By: Ruthine Dose on 06/24/2016 11:07:09 Murlean Caller ([id number]) -------------------------------------------------------------------------------- Lower Extremity Assessment Details Patient Name: [name] T. Date of Service: 06/24/2016 9:30 AM Medical Record Number:  [id number] Patient Account Number: [id number] Date of Birth/Sex: 1941/05/11 (75 y.o. Male) Treating RN: Carolyne Fiscal, Debi Primary Care Physician: [name] Other Clinician: Referring Physician: [name] Treating Physician/Extender: [name] Weeks in Treatment: 0 Edema Assessment Assessed: [Left: No] [Right: No] Edema: [Left: Yes] [Right: No] Calf Left: Right: Point of Measurement: 32 cm From Medial Instep 34.6 cm 32.8 cm Ankle Left: Right: Point of Measurement: 11 cm From Medial Instep 26.6 cm 20.2 cm Vascular Assessment Pulses: Posterior Tibial Dorsalis Pedis Palpable: [Left:No] [Right:Yes] Doppler: [Right:Multiphasic] Extremity colors, hair growth, and conditions: Extremity Color: [Left:Red] [Right:Normal] Hair Growth on Extremity: [Left:No] [Right:No] Temperature of Extremity: [Left:Warm] [Right:Warm] Capillary Refill: [Left:< 3 seconds] [Right:< 3 seconds] Toe Nail Assessment Left: Right: Thick: Yes Yes Discolored: Yes Yes Deformed: No No Improper Length and Hygiene: No No Notes no right ABI d/t extreme pain Electronic Signature(s) Signed: 06/24/2016 3:59:42 PM By: Cleatis Polka ([id number]) Entered By: Alric Quan on 06/24/2016 10:09:35 Murlean Caller ([id number]) -------------------------------------------------------------------------------- Multi Wound Chart Details Patient Name: [name] T. Date of Service: 06/24/2016 9:30 AM Medical Record Number: [id number] Patient Account Number: [id number] Date of Birth/Sex: Apr 05, 1941 (75 y.o. Male) Treating RN: Carolyne Fiscal, Debi Primary Care Physician: [name] Other Clinician: Referring Physician: [name] Treating Physician/Extender: [name] Weeks in Treatment: 0 Vital Signs Height(in): 69 Pulse(bpm): 78 Weight(lbs): 182 Blood Pressure 127/106 (mmHg): Body Mass Index(BMI): 27 Temperature(F): 98.1 Respiratory  Rate 20 (breaths/min): Photos: [1:No Photos] [N/A:N/A] Wound Location: [1:Left Lower Leg - Circumfernential] [N/A:N/A] Wounding Event: [1:Trauma] [N/A:N/A] Primary Etiology: [1:Trauma, Other] [N/A:N/A] Comorbid History: [1:Asthma, Hypertension, Hepatitis C, Type II Diabetes, Osteoarthritis] [N/A:N/A] Date Acquired: [1:06/12/2016] [N/A:N/A] Weeks of Treatment: [1:0] [N/A:N/A] Wound Status: [1:Open] [N/A:N/A] Measurements L x W x D 9.2x26.2x0.1 [N/A:N/A] (cm) Area (cm) : [1:189.312] [N/A:N/A] Volume (cm) : [1:18.931] [N/A:N/A] Classification: [1:Partial Thickness] [N/A:N/A] HBO Classification: [1:Grade 1] [N/A:N/A] Exudate Amount: [1:Large] [N/A:N/A] Exudate Type: [1:Serosanguineous] [N/A:N/A] Exudate Color: [1:red, brown] [N/A:N/A] Wound Margin: [1:Flat and Intact] [N/A:N/A] Granulation Amount: [1:Small (1-33%)] [N/A:N/A] Granulation Quality: [1:Red] [N/A:N/A] Necrotic Amount: [1:Large (67-100%)] [N/A:N/A] Necrotic Tissue: [1:Eschar, Adherent Slough] [N/A:N/A] Exposed Structures: [1:Fascia: No Fat: No Tendon: No Muscle: No Joint: No] [N/A:N/A] Bone: No Limited to Skin Breakdown Epithelialization: None N/A N/A Periwound Skin Texture: Edema: Yes N/A N/A Periwound Skin Moist: Yes N/A N/A Moisture: Periwound Skin Color: Erythema: Yes N/A N/A Erythema Location:  Circumferential N/A N/A Temperature: No Abnormality N/A N/A Tenderness on Yes N/A N/A Palpation: Wound Preparation: Ulcer Cleansing: Other: N/A N/A soap and water Topical Anesthetic Applied: Other: lidocaine 4% Treatment Notes Electronic Signature(s) Signed: 06/24/2016 3:59:42 PM By: Alric Quan Entered By: Alric Quan on 06/24/2016 10:45:13 Murlean Caller ([id number]) -------------------------------------------------------------------------------- [place] Details Patient Name: [name] T. Date of Service: 06/24/2016 9:30 AM Medical Record Number: [id number] Patient  Account Number: [id number] Date of Birth/Sex: [birth year] (75 y.o. Male) Treating RN: Ahmed Prima Primary Care Physician: [name] Other Clinician: Referring Physician: [name] Treating Physician/Extender: [name] in Treatment: 0 Active Inactive Orientation to the Wound Care Program Nursing Diagnoses: Knowledge deficit related to the wound healing center program Goals: Patient/caregiver will verbalize understanding of the [facility] Date Initiated: 06/24/2016 Goal Status: Active Interventions: Provide education on orientation to the wound center Notes: Pain, Acute or Chronic Nursing Diagnoses: Pain, acute or chronic: actual or potential Goals: Patient will verbalize adequate pain control and receive pain control interventions during procedures as needed Date Initiated: 06/24/2016 Goal Status: Active Patient/caregiver will verbalize adequate pain control between visits Date Initiated: 06/24/2016 Goal Status: Active Interventions: Assess comfort goal upon admission Complete pain assessment as per visit requirements Notes: Soft Tissue Infection Nursing Diagnoses: MARSH, BEDOY ([id number]) Impaired tissue integrity Goals: Patient/caregiver will verbalize understanding of or measures to prevent infection and contamination in the home setting Date Initiated: 06/24/2016 Goal Status: Active Patient's soft tissue infection will resolve Date Initiated: 06/24/2016 Goal Status: Active Interventions: Assess signs and symptoms of infection every visit Notes: Wound/Skin Impairment Nursing Diagnoses: Impaired tissue integrity Goals: Ulcer/skin breakdown will have a volume reduction of 30% by week 4 Date Initiated: 06/24/2016 Goal Status: Active Ulcer/skin breakdown will have a volume reduction of 50% by week 8 Date Initiated: 06/24/2016 Goal Status: Active Ulcer/skin breakdown will have a volume reduction of 80% by week  12 Date Initiated: 06/24/2016 Goal Status: Active Interventions: Assess patient/caregiver ability to perform ulcer/skin care regimen upon admission and as needed Assess ulceration(s) every visit Notes: Electronic Signature(s) Signed: 06/24/2016 3:59:42 PM By: Alric Quan Entered By: Alric Quan on 06/24/2016 10:44:56 Ola, [name] ([id number]) -------------------------------------------------------------------------------- Pain Assessment Details Patient Name: [name] T. Date of Service: 06/24/2016 9:30 AM Medical Record Number: [id number] Patient Account Number: [id number] Date of Birth/Sex: July 06, 1941 (75 y.o. Male) Treating RN: Ahmed Prima Primary Care Physician: [name] Other Clinician: Referring Physician: [name] Treating Physician/Extender: [name] Weeks in Treatment: 0 Active Problems Location of Pain Severity and Description of Pain Patient Has Paino Yes Site Locations Pain Location: Pain in Ulcers With Dressing Change: Yes Duration of the Pain. Constant / Intermittento Constant Rate the pain. Current Pain Level: 10 Worst Pain Level: 10 Least Pain Level: 0 Character of Pain Describe the Pain: Aching, Burning, Throbbing Pain Management and Medication Current Pain Management: Electronic Signature(s) Signed: 06/24/2016 3:59:42 PM By: Alric Quan Entered By: Alric Quan on 06/24/2016 09:41:50 Murlean Caller ([id number]) -------------------------------------------------------------------------------- Patient/Caregiver Education Details Patient Name: [name] T. Date of Service: 06/24/2016 9:30 AM Medical Record Number: [id number] Patient Account Number: [id number] Date of Birth/Gender: 1941/07/08 (75 y.o. Male) Treating RN: Ahmed Prima Primary Care Physician: [name] Other Clinician: Referring Physician: [name] Treating Physician/Extender: [name] in Treatment:  0 Education Assessment Education Provided To: Patient Education Topics Provided Welcome To [facility]: Handouts: Welcome To [facility] Methods: Explain/Verbal Wound/Skin Impairment: Handouts: Other: do not get  wrap wet Methods: Demonstration, Explain/Verbal Responses: State content correctly Electronic Signature(s) Signed: 06/24/2016 3:59:42 PM By: Alric Quan Entered By: Alric Quan on 06/24/2016 11:06:48 Murlean Caller ([id number]) -------------------------------------------------------------------------------- Wound Assessment Details Patient Name: [name] T. Date of Service: 06/24/2016 9:30 AM Medical Record Number: [id number] Patient Account Number: [id number] Date of Birth/Sex: 08-30-1941 (74 y.o. Male) Treating RN: Carolyne Fiscal, Debi Primary Care Physician: [name] Other Clinician: Referring Physician: [name] Treating Physician/Extender: [name] Weeks in Treatment: 0 Wound Status Wound Number: 1 Primary Trauma, Other Etiology: Wound Location: Left Lower Leg - Circumfernential Wound Open Status: Wounding Event: Trauma Comorbid Asthma, Hypertension, Hepatitis C, Date Acquired: 06/12/2016 History: Type II Diabetes, Osteoarthritis Weeks Of Treatment: 0 Clustered Wound: No Photos Photo Uploaded By: Alric Quan on 06/24/2016 12:02:36 Wound Measurements Length: (cm) 9.2 Width: (cm) 26.2 Depth: (cm) 0.1 Area: (cm) 189.312 Volume: (cm) 18.931 % Reduction in Area: % Reduction in Volume: Epithelialization: None Tunneling: No Undermining: No Wound Description Classification: Partial Thickness Diabetic Severity (Wagner): Grade 1 Wound Margin: Flat and Intact Exudate Amount: Large Exudate Type: Serosanguineous Exudate Color: red, brown Foul Odor After Cleansing: No Wound Bed Granulation Amount: Small (1-33%) Exposed Structure Granulation Quality: Red Fascia Exposed: No Kalis, Schyler T.  ([id number]) Necrotic Amount: Large (67-100%) Fat Layer Exposed: No Necrotic Quality: Eschar, Adherent Slough Tendon Exposed: No Muscle Exposed: No Joint Exposed: No Bone Exposed: No Limited to Skin Breakdown Periwound Skin Texture Texture Color No Abnormalities Noted: No No Abnormalities Noted: No Localized Edema: Yes Erythema: Yes Erythema Location: Circumferential Moisture No Abnormalities Noted: No Temperature / Pain Moist: Yes Temperature: No Abnormality Tenderness on Palpation: Yes Wound Preparation Ulcer Cleansing: Other: soap and water, Topical Anesthetic Applied: Other: lidocaine 4%, Treatment Notes Wound #1 (Left, Circumferential Lower Leg) 1. Cleansed with: Clean wound with Normal Saline Cleanse wound with antibacterial soap and water 2. Anesthetic Topical Lidocaine 4% cream to wound bed prior to debridement 4. Dressing Applied: Aquacel Ag 5. Secondary Dressing Applied ABD Pad 7. Secured with Tape 3 Layer Compression System - Left Lower Extremity Electronic Signature(s) Signed: 06/24/2016 3:59:42 PM By: Alric Quan Entered By: Alric Quan on 06/24/2016 10:13:09 Murlean Caller ([id number]) -------------------------------------------------------------------------------- Vitals Details Patient Name: [name] T. Date of Service: 06/24/2016 9:30 AM Medical Record Number: [id number] Patient Account Number: [id number] Date of Birth/Sex: 1941/07/02 (75 y.o. Male) Treating RN: Carolyne Fiscal, Debi Primary Care Physician: [name] Other Clinician: Referring Physician: [name] Treating Physician/Extender: [name] Weeks in Treatment: 0 Vital Signs Time Taken: 09:41 Temperature (F): 98.1 Height (in): 69 Pulse (bpm): 78 Source: Stated Respiratory Rate (breaths/min): 20 Weight (lbs): 182 Blood Pressure (mmHg): 127/106 Source: Stated Reference Range: 80 - 120 mg / dl Body Mass Index (BMI): 26.9 Electronic  Signature(s) Signed: 06/24/2016 3:59:42 PM By: Alric Quan Entered By: Alric Quan on 06/24/2016 09:43:02
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
## 2016-06-25 NOTE — Progress Notes (Signed)
[name], [name] ([id number]) Visit Report for 06/24/2016 Abuse/Suicide Risk Screen Details Patient Name: [name], [name] Date of Service: 06/24/2016 9:30 AM Medical Record Number: [id number] Patient Account Number: [id number] Date of Birth/Sex: 08-22-1941 (75 y.o. Male) Treating RN: Ahmed Prima Primary Care Physician: [name] Other Clinician: Referring Physician: [name] Treating Physician/Extender: [name] Weeks in Treatment: 0 Abuse/Suicide Risk Screen Items Answer ABUSE/SUICIDE RISK SCREEN: Has anyone close to you tried to hurt or harm you recentlyo No Do you feel uncomfortable with anyone in your familyo No Has anyone forced you do things that you didnot want to doo No Do you have any thoughts of harming yourselfo No Patient displays signs or symptoms of abuse and/or neglect. No Electronic Signature(s) Signed: 06/24/2016 3:59:42 PM By: Alric Quan Entered By: Alric Quan on 06/24/2016 09:52:57 Murlean Caller ([id number]) -------------------------------------------------------------------------------- Activities of Daily Living Details Patient Name: John Sours T. Date of Service: 06/24/2016 9:30 AM Medical Record Number: [id number] Patient Account Number: [id number] Date of Birth/Sex: [birth year] (75 y.o. Male) Treating RN: Ahmed Prima Primary Care Physician: [name] Other Clinician: Referring Physician: [name] Treating Physician/Extender: [name] Weeks in Treatment: 0 Activities of Daily Living Items Answer Activities of Daily Living (Please select one for each item) Drive Automobile Completely Able Take Medications Completely Able Use Telephone Completely Able Care for Appearance Completely Able Use Toilet Completely Able Bath / Shower Completely Able Dress Self Completely Able Feed Self Completely Able Walk Completely Able Get In / Out Bed Completely Able Housework Completely Able Prepare  Meals Completely [place] for Self Completely Able Electronic Signature(s) Signed: 06/24/2016 3:59:42 PM By: Alric Quan Entered By: Alric Quan on 06/24/2016 09:53:13 Murlean Caller ([id number]) -------------------------------------------------------------------------------- Education Assessment Details Patient Name: John Sours T. Date of Service: 06/24/2016 9:30 AM Medical Record Number: [id number] Patient Account Number: [id number] Date of Birth/Sex: Sep 08, 1941 (75 y.o. Male) Treating RN: Ahmed Prima Primary Care Physician: [name] Other Clinician: Referring Physician: [name] Treating Physician/Extender: [name] in Treatment: 0 Primary Learner Assessed: Patient Learning Preferences/Education Level/Primary Language Learning Preference: Explanation, Printed Material Highest Education Level: High School Preferred Language: English Cognitive Barrier Assessment/Beliefs Language Barrier: No Translator Needed: No Memory Deficit: No Emotional Barrier: No Cultural/Religious Beliefs Affecting Medical No Care: Physical Barrier Assessment Impaired Vision: Yes Glasses Impaired Hearing: No Decreased Hand dexterity: No Knowledge/Comprehension Assessment Knowledge Level: High Comprehension Level: High Ability to understand written High instructions: Ability to understand verbal High instructions: Motivation Assessment Anxiety Level: Calm Cooperation: Cooperative Education Importance: Acknowledges Need Interest in Health Problems: Asks Questions Perception: Coherent Willingness to Engage in Self- High Management Activities: Readiness to Engage in Self- High Management Activities: Electronic Signature(s) TALIQ, CHILCOAT ([id number]) Signed: 06/24/2016 3:59:42 PM By: Alric Quan Entered By: Alric Quan on 06/24/2016 09:53:34 Murlean Caller  ([id number]) -------------------------------------------------------------------------------- Fall Risk Assessment Details Patient Name: John Sours T. Date of Service: 06/24/2016 9:30 AM Medical Record Number: [id number] Patient Account Number: [id number] Date of Birth/Sex: 01/24/1941 (75 y.o. Male) Treating RN: Carolyne Fiscal, Debi Primary Care Physician: [name] Other Clinician: Referring Physician: [name] Treating Physician/Extender: [name] Weeks in Treatment: 0 Fall Risk Assessment Items Have you had 2 or more falls in the last 12 monthso 0 No Have you had any fall that resulted in injury in the last 12 monthso 0 No FALL RISK ASSESSMENT: History of falling - immediate or within 3 months 0 No Secondary diagnosis 15 Yes Ambulatory aid None/bed rest/wheelchair/nurse 0  Yes Crutches/cane/walker 15 Yes Furniture 0 No IV Access/Saline Lock 0 No Gait/Training Normal/bed rest/immobile 0 No Weak 10 Yes Impaired 20 Yes Mental Status Oriented to own ability 0 Yes Electronic Signature(s) Signed: 06/24/2016 3:59:42 PM By: Alric Quan Entered By: Alric Quan on 06/24/2016 09:53:55 Swendsen, [name] ([id number]) -------------------------------------------------------------------------------- Foot Assessment Details Patient Name: John Sours T. Date of Service: 06/24/2016 9:30 AM Medical Record Number: [id number] Patient Account Number: [id number] Date of Birth/Sex: [birth year] (75 y.o. Male) Treating RN: Ahmed Prima Primary Care Physician: [name] Other Clinician: Referring Physician: [name] Treating Physician/Extender: [name] Weeks in Treatment: 0 Foot Assessment Items Site Locations + = Sensation present, - = Sensation absent, C = Callus, U = Ulcer R = Redness, W = Warmth, M = Maceration, PU = Pre-ulcerative lesion F = Fissure, S = Swelling, D = Dryness Assessment Right: Left: Other Deformity: No No Prior Foot  Ulcer: No No Prior Amputation: No No Charcot Joint: No No Ambulatory Status: Ambulatory With Help Assistance Device: Walker Gait: Administrator, arts) Signed: 06/24/2016 3:59:42 PM By: Alric Quan Entered By: Alric Quan on 06/24/2016 10:00:46 Murlean Caller ([id number]) -------------------------------------------------------------------------------- Nutrition Risk Assessment Details Patient Name: John Sours T. Date of Service: 06/24/2016 9:30 AM Medical Record Number: [id number] Patient Account Number: [id number] Date of Birth/Sex: November 22, 1941 (75 y.o. Male) Treating RN: Carolyne Fiscal, Debi Primary Care Physician: [name] Other Clinician: Referring Physician: [name] Treating Physician/Extender: [name] Weeks in Treatment: 0 Height (in): 69 Weight (lbs): 182 Body Mass Index (BMI): 26.9 Nutrition Risk Assessment Items NUTRITION RISK SCREEN: I have an illness or condition that made me change the kind and/or 0 No amount of food I eat I eat fewer than two meals per day 0 No I eat few fruits and vegetables, or milk products 0 No I have three or more drinks of beer, liquor or wine almost every day 0 No I have tooth or mouth problems that make it hard for me to eat 0 No I don't always have enough money to buy the food I need 0 No I eat alone most of the time 0 No I take three or more different prescribed or over-the-counter drugs a 0 No day Without wanting to, I have lost or gained 10 pounds in the last six 0 No months I am not always physically able to shop, cook and/or feed myself 0 No Nutrition Protocols Good Risk Protocol Moderate Risk Protocol Electronic Signature(s) Signed: 06/24/2016 3:59:42 PM By: Alric Quan Entered By: Alric Quan on 06/24/2016 09:54:03

## 2016-07-01 ENCOUNTER — Ambulatory Visit
Admission: RE | Admit: 2016-07-01 | Discharge: 2016-07-01 | Disposition: A | Discharge status: Home / Self Care | Payer: PPO | Source: Ambulatory Visit | Attending: Internal Medicine | Admitting: Internal Medicine

## 2016-07-01 ENCOUNTER — Encounter: Payer: PPO | Attending: Internal Medicine | Admitting: Internal Medicine

## 2016-07-01 ENCOUNTER — Other Ambulatory Visit: Payer: Self-pay | Admitting: Internal Medicine

## 2016-07-01 DIAGNOSIS — S81812D Laceration without foreign body, left lower leg, subsequent encounter: Secondary | ICD-10-CM | POA: Insufficient documentation

## 2016-07-01 DIAGNOSIS — X58XXXD Exposure to other specified factors, subsequent encounter: Secondary | ICD-10-CM | POA: Diagnosis not present

## 2016-07-01 DIAGNOSIS — I10 Essential (primary) hypertension: Secondary | ICD-10-CM | POA: Diagnosis not present

## 2016-07-01 DIAGNOSIS — S82892D Other fracture of left lower leg, subsequent encounter for closed fracture with routine healing: Secondary | ICD-10-CM | POA: Insufficient documentation

## 2016-07-01 DIAGNOSIS — E119 Type 2 diabetes mellitus without complications: Secondary | ICD-10-CM | POA: Diagnosis not present

## 2016-07-01 DIAGNOSIS — B1920 Unspecified viral hepatitis C without hepatic coma: Secondary | ICD-10-CM | POA: Insufficient documentation

## 2016-07-01 DIAGNOSIS — S92302A Fracture of unspecified metatarsal bone(s), left foot, initial encounter for closed fracture: Secondary | ICD-10-CM

## 2016-07-01 DIAGNOSIS — J45909 Unspecified asthma, uncomplicated: Secondary | ICD-10-CM | POA: Insufficient documentation

## 2016-07-01 DIAGNOSIS — S82892A Other fracture of left lower leg, initial encounter for closed fracture: Secondary | ICD-10-CM

## 2016-07-01 DIAGNOSIS — M1990 Unspecified osteoarthritis, unspecified site: Secondary | ICD-10-CM | POA: Diagnosis not present

## 2016-07-01 DIAGNOSIS — M84464D Pathological fracture, left fibula, subsequent encounter for fracture with routine healing: Secondary | ICD-10-CM | POA: Insufficient documentation
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
## 2016-07-02 NOTE — Progress Notes (Signed)
LAMAN, RUDDEN ([id number]) Visit Report for 07/01/2016 Arrival Information Details Patient Name: [name], [name]. Date of Service: 07/01/2016 10:45 AM Medical Record Number: [id number] Patient Account Number: [id number] Date of Birth/Sex: Nov 14, 1941 (75 y.o. Male) Treating RN: Ahmed Prima Primary Care Physician: [name] Other Clinician: Referring Physician: [name] Treating Physician/Extender: [name] Weeks in Treatment: 1 Visit Information History Since Last Visit All ordered tests and consults were completed: No Patient Arrived: Wheel Chair Added or deleted any medications: No Arrival Time: 10:47 Any new allergies or adverse reactions: No Accompanied By: daughter Had a fall or experienced change in No Transfer Assistance: Easy[employer] activities of daily living that may affect Patient Lift risk of falls: Patient Identification Verified: Yes Signs or symptoms of abuse/neglect since last No Secondary Verification Process Yes visito Completed: Hospitalized since last visit: No Patient Requires Transmission- No Pain Present Now: Yes Based Precautions: Patient Has Alerts: Yes Patient Alerts: DM II HEP C+ NO L ABI d/t pain Electronic Signature(s) Signed: 07/01/2016 4:35:54 PM By: Alric Quan Entered By: Alric Quan on 07/01/2016 10:49:33 Murlean Caller ([id number]) -------------------------------------------------------------------------------- Encounter Discharge Information Details Patient Name: John [name] Date of Service: 07/01/2016 10:45 AM Medical Record Number: [id number] Patient Account Number: [id number] Date of Birth/Sex: 1941-02-18 (75 y.o. Male) Treating RN: Ahmed Prima Primary Care Physician: [name] Other Clinician: Referring Physician: [name] Treating Physician/Extender: [name] in Treatment: 1 Encounter Discharge Information Items Discharge Pain Level: 5 Discharge  Condition: Stable Ambulatory Status: Wheelchair Discharge Destination: Home Transportation: Private Auto Accompanied By: daughter Schedule Follow-up Appointment: Yes Medication Reconciliation completed Yes and provided to Patient/Care Sarai January: Provided on Clinical Summary of Care: 07/01/2016 Form Type Recipient Paper Patient RR Electronic Signature(s) Signed: 07/01/2016 4:35:54 PM By: Alric Quan Previous Signature: 07/01/2016 11:57:39 AM Version By: Ruthine Dose Entered By: Alric Quan on 07/01/2016 11:58:28 ROTENFreddi Starr ([id number]) -------------------------------------------------------------------------------- Lower Extremity Assessment Details Patient Name: John [name] Date of Service: 07/01/2016 10:45 AM Medical Record Number: [id number] Patient Account Number: [id number] Date of Birth/Sex: [birth year] (75 y.o. Male) Treating RN: Ahmed Prima Primary Care Physician: [name] Other Clinician: Referring Physician: [name] Treating Physician/Extender: [name] Weeks in Treatment: 1 Edema Assessment Assessed: [Left: No] [Right: No] E[Left: dema] [Right: :] Calf Left: Right: Point of Measurement: 32 cm From Medial Instep 30.8 cm cm Ankle Left: Right: Point of Measurement: 11 cm From Medial Instep 24 cm cm Vascular Assessment Pulses: Posterior Tibial Dorsalis Pedis Palpable: [Left:Yes] Extremity colors, hair growth, and conditions: Extremity Color: [Left:Hyperpigmented] Temperature of Extremity: [Left:Warm] Capillary Refill: [Left:< 3 seconds] Toe Nail Assessment Left: Right: Thick: Yes Discolored: No Deformed: No Improper Length and Hygiene: No Electronic Signature(s) Signed: 07/01/2016 4:35:54 PM By: Alric Quan Entered By: Alric Quan on 07/01/2016 11:04:23 ROTENFreddi Starr ([id number]) -------------------------------------------------------------------------------- Multi Wound Chart Details Patient Name:  John [name] Date of Service: 07/01/2016 10:45 AM Medical Record Number: [id number] Patient Account Number: [id number] Date of Birth/Sex: [birth year] (75 y.o. Male) Treating RN: Carolyne Fiscal, Debi Primary Care Physician: [name] Other Clinician: Referring Physician: [name] Treating Physician/Extender: [name] Weeks in Treatment: 1 Vital Signs Height(in): 69 Pulse(bpm): 76 Weight(lbs): 182 Blood Pressure 145/67 (mmHg): Body Mass Index(BMI): 27 Temperature(F): 98.1 Respiratory Rate 20 (breaths/min): Photos: [1:No Photos] [N/A:N/A] Wound Location: [1:Left Lower Leg - Circumfernential] [N/A:N/A] Wounding Event: [1:Trauma] [N/A:N/A] Primary Etiology: [1:Trauma, Other] [N/A:N/A] Comorbid History: [1:Asthma, Hypertension, Hepatitis C, Type II Diabetes, Osteoarthritis] [N/A:N/A] Date Acquired: [1:06/12/2016] [N/A:N/A] Weeks of Treatment: [1:1] [N/A:N/A] Wound  Status: [1:Open] [N/A:N/A] Measurements L x W x D 6.5x24x0.2 [N/A:N/A] (cm) Area (cm) : [1:122.522] [N/A:N/A] Volume (cm) : [1:24.504] [N/A:N/A] % Reduction in Area: [1:35.30%] [N/A:N/A] % Reduction in Volume: -29.40% [N/A:N/A] Classification: [1:Partial Thickness] [N/A:N/A] HBO Classification: [1:Grade 1] [N/A:N/A] Exudate Amount: [1:Large] [N/A:N/A] Exudate Type: [1:Serosanguineous] [N/A:N/A] Exudate Color: [1:red, brown] [N/A:N/A] Wound Margin: [1:Flat and Intact] [N/A:N/A] Granulation Amount: [1:Small (1-33%)] [N/A:N/A] Granulation Quality: [1:Red] [N/A:N/A] Necrotic Amount: [1:Large (67-100%)] [N/A:N/A] Necrotic Tissue: [1:Eschar, Adherent Slough] [N/A:N/A] Exposed Structures: [1:Fascia: No Fat: No Tendon: No] [N/A:N/A] Muscle: No Joint: No Bone: No Limited to Skin Breakdown Epithelialization: None N/A N/A Periwound Skin Texture: Edema: Yes N/A N/A Periwound Skin Moist: Yes N/A N/A Moisture: Periwound Skin Color: Erythema: Yes N/A N/A Erythema Location: Circumferential N/A  N/A Temperature: No Abnormality N/A N/A Tenderness on Yes N/A N/A Palpation: Wound Preparation: Ulcer Cleansing: Other: N/A N/A soap and water Topical Anesthetic Applied: Other: lidocaine 4% Treatment Notes Electronic Signature(s) Signed: 07/01/2016 4:35:54 PM By: Alric Quan Entered By: Alric Quan on 07/01/2016 11:07:56 Murlean Caller ([id number]) -------------------------------------------------------------------------------- [place] Details Patient Name: John [name] Date of Service: 07/01/2016 10:45 AM Medical Record Number: [id number] Patient Account Number: [id number] Date of Birth/Sex: [birth year] (75 y.o. Male) Treating RN: Ahmed Prima Primary Care Physician: [name] Other Clinician: Referring Physician: [name] Treating Physician/Extender: [name] in Treatment: 1 Active Inactive Orientation to the Wound Care Program Nursing Diagnoses: Knowledge deficit related to the wound healing center program Goals: Patient/caregiver will verbalize understanding of the [facility] Date Initiated: 06/24/2016 Goal Status: Active Interventions: Provide education on orientation to the wound center Notes: Pain, Acute or Chronic Nursing Diagnoses: Pain, acute or chronic: actual or potential Goals: Patient will verbalize adequate pain control and receive pain control interventions during procedures as needed Date Initiated: 06/24/2016 Goal Status: Active Patient/caregiver will verbalize adequate pain control between visits Date Initiated: 06/24/2016 Goal Status: Active Interventions: Assess comfort goal upon admission Complete pain assessment as per visit requirements Notes: Soft Tissue Infection Nursing Diagnoses: PARAM, HURD ([id number]) Impaired tissue integrity Goals: Patient/caregiver will verbalize understanding of or measures to prevent infection and contamination in the home  setting Date Initiated: 06/24/2016 Goal Status: Active Patient's soft tissue infection will resolve Date Initiated: 06/24/2016 Goal Status: Active Interventions: Assess signs and symptoms of infection every visit Notes: Wound/Skin Impairment Nursing Diagnoses: Impaired tissue integrity Goals: Ulcer/skin breakdown will have a volume reduction of 30% by week 4 Date Initiated: 06/24/2016 Goal Status: Active Ulcer/skin breakdown will have a volume reduction of 50% by week 8 Date Initiated: 06/24/2016 Goal Status: Active Ulcer/skin breakdown will have a volume reduction of 80% by week 12 Date Initiated: 06/24/2016 Goal Status: Active Interventions: Assess patient/caregiver ability to perform ulcer/skin care regimen upon admission and as needed Assess ulceration(s) every visit Notes: Electronic Signature(s) Signed: 07/01/2016 4:35:54 PM By: Alric Quan Entered By: Alric Quan on 07/01/2016 11:07:50 ROTENFreddi Starr ([id number]) -------------------------------------------------------------------------------- Pain Assessment Details Patient Name: John [name] Date of Service: 07/01/2016 10:45 AM Medical Record Number: [id number] Patient Account Number: [id number] Date of Birth/Sex: 02-25-1941 (75 y.o. Male) Treating RN: Ahmed Prima Primary Care Physician: [name] Other Clinician: Referring Physician: [name] Treating Physician/Extender: [name] Weeks in Treatment: 1 Active Problems Location of Pain Severity and Description of Pain Patient Has Paino Yes Site Locations Pain Location: Pain in Ulcers With Dressing Change: Yes Duration of the Pain. Constant / Intermittento Constant Rate the pain. Current Pain Level: 5 Worst Pain  Level: 10 Least Pain Level: 3 Character of Pain Describe the Pain: Burning, Shooting, Throbbing Pain Management and Medication Current Pain Management: Electronic Signature(s) Signed: 07/01/2016 4:35:54 PM By:  Alric Quan Entered By: Alric Quan on 07/01/2016 10:49:59 Murlean Caller ([id number]) -------------------------------------------------------------------------------- Patient/Caregiver Education Details Patient Name: John [name] Date of Service: 07/01/2016 10:45 AM Medical Record Number: [id number] Patient Account Number: [id number] Date of Birth/Gender: Mar 16, 1941 (75 y.o. Male) Treating RN: Ahmed Prima Primary Care Physician: [name] Other Clinician: Referring Physician: [name] Treating Physician/Extender: [name] in Treatment: 1 Education Assessment Education Provided To: Patient Education Topics Provided Wound/Skin Impairment: Handouts: Other: do not get wrap wet Methods: Demonstration, Explain/Verbal Responses: State content correctly Electronic Signature(s) Signed: 07/01/2016 4:35:54 PM By: Alric Quan Entered By: Alric Quan on 07/01/2016 11:58:40 Vent[place], [name] ([id number]) -------------------------------------------------------------------------------- Wound Assessment Details Patient Name: John [name] Date of Service: 07/01/2016 10:45 AM Medical Record Number: [id number] Patient Account Number: [id number] Date of Birth/Sex: 1941/04/06 (75 y.o. Male) Treating RN: Carolyne Fiscal, Debi Primary Care Physician: [name] Other Clinician: Referring Physician: [name] Treating Physician/Extender: [name] Weeks in Treatment: 1 Wound Status Wound Number: 1 Primary Trauma, Other Etiology: Wound Location: Left Lower Leg - Circumfernential Wound Open Status: Wounding Event: Trauma Comorbid Asthma, Hypertension, Hepatitis C, Date Acquired: 06/12/2016 History: Type II Diabetes, Osteoarthritis Weeks Of Treatment: 1 Clustered Wound: No Photos Photo Uploaded By: Alric Quan on 07/01/2016 13:13:39 Wound Measurements Length: (cm) 6.5 Width: (cm) 16.5 Depth: (cm) 0.2 Area: (cm)  84.234 Volume: (cm) 16.847 % Reduction in Area: 55.5% % Reduction in Volume: 11% Epithelialization: None Tunneling: No Undermining: No Wound Description Classification: Partial Thickness Diabetic Severity (Wagner): Grade 1 Wound Margin: Flat and Intact Exudate Amount: Large Exudate Type: Serosanguineous Exudate Color: red, brown Foul Odor After Cleansing: No Wound Bed Granulation Amount: Small (1-33%) Exposed Structure Granulation Quality: Red Fascia Exposed: No Murtha, Duc T. ([id number]) Necrotic Amount: Large (67-100%) Fat Layer Exposed: No Necrotic Quality: Eschar, Adherent Slough Tendon Exposed: No Muscle Exposed: No Joint Exposed: No Bone Exposed: No Limited to Skin Breakdown Periwound Skin Texture Texture Color No Abnormalities Noted: No No Abnormalities Noted: No Localized Edema: Yes Erythema: Yes Erythema Location: Circumferential Moisture No Abnormalities Noted: No Temperature / Pain Moist: Yes Temperature: No Abnormality Tenderness on Palpation: Yes Wound Preparation Ulcer Cleansing: Other: soap and water, Topical Anesthetic Applied: Other: lidocaine 4%, Treatment Notes Wound #1 (Left, Circumferential Lower Leg) 1. Cleansed with: Cleanse wound with antibacterial soap and water 2. Anesthetic Topical Lidocaine 4% cream to wound bed prior to debridement 3. Peri-wound Care: Barrier cream 4. Dressing Applied: Prisma Ag 5. Secondary Dressing Applied ABD Pad Dry Gauze 7. Secured with Tape 3 Layer Compression System - Left Lower Extremity Notes unna to anchor, carboflex Electronic Signature(s) Signed: 07/01/2016 4:35:54 PM By: Alric Quan Entered By: Alric Quan on 07/01/2016 11:48:05 ROTENFreddi Starr ([id number]) -------------------------------------------------------------------------------- Vitals Details Patient Name: John [name] Date of Service: 07/01/2016 10:45 AM Medical Record Number: [id number] Patient Account Number:  [id number] Date of Birth/Sex: 1941-01-28 (75 y.o. Male) Treating RN: Carolyne Fiscal, Debi Primary Care Physician: [name] Other Clinician: Referring Physician: [name] Treating Physician/Extender: [name] Weeks in Treatment: 1 Vital Signs Time Taken: 10:50 Temperature (F): 98.1 Height (in): 69 Pulse (bpm): 76 Weight (lbs): 182 Respiratory Rate (breaths/min): 20 Body Mass Index (BMI): 26.9 Blood Pressure (mmHg): 145/67 Reference Range: 80 - 120 mg / dl Electronic Signature(s) Signed: 07/01/2016 4:35:54 PM By: Alric Quan Entered By: Alric Quan  on 07/01/2016 10:51:37

## 2016-07-03 DIAGNOSIS — S82409A Unspecified fracture of shaft of unspecified fibula, initial encounter for closed fracture: Secondary | ICD-10-CM | POA: Insufficient documentation

## 2016-07-08 ENCOUNTER — Encounter: Payer: PPO | Admitting: Surgery

## 2016-07-08 DIAGNOSIS — S81812D Laceration without foreign body, left lower leg, subsequent encounter: Secondary | ICD-10-CM | POA: Diagnosis not present

## 2016-07-08 NOTE — Progress Notes (Signed)
JACKSYN, TRAINER ([id number]) Visit Report for 07/08/2016 Arrival Information Details Patient Name: [name] [name]. Date of Service: 07/08/2016 8:00 AM Medical Record Number: [id number] Patient Account Number: [id number] Date of Birth/Sex: [birth year] (75 y.o. Male) Treating RN: Montey Hora Primary Care Physician: [name] Other Clinician: Referring Physician: [name] Treating Physician/Extender: Frann Rider in Treatment: 2 Visit Information History Since Last Visit Added or deleted any medications: No Patient Arrived: Walker Any new allergies or adverse reactions: No Arrival Time: 08:09 Had a fall or experienced change in No Accompanied By: self activities of daily living that may affect Transfer Assistance: None risk of falls: Patient Identification Verified: Yes Signs or symptoms of abuse/neglect since last No Secondary Verification Process Completed: Yes visito Patient Requires Transmission-Based No Hospitalized since last visit: No Precautions: Pain Present Now: Yes Patient Has Alerts: Yes Electronic Signature(s) Signed: 07/08/2016 4:35:40 PM By: Montey Hora Entered By: Montey Hora on 07/08/2016 08:10:37 Murlean Caller ([id number]) -------------------------------------------------------------------------------- Encounter Discharge Information Details Patient Name: [name] Sours T. Date of Service: 07/08/2016 8:00 AM Medical Record Number: [id number] Patient Account Number: [id number] Date of Birth/Sex: [birth year] (75 y.o. Male) Treating RN: Montey Hora Primary Care Physician: [name] Other Clinician: Referring Physician: [name] Treating Physician/Extender: Frann Rider in Treatment: 2 Encounter Discharge Information Items Discharge Pain Level: 0 Discharge Condition: Stable Ambulatory Status: Walker Discharge Destination: Home Transportation: Private Auto Accompanied By: self Schedule Follow-up  Appointment: Yes Medication Reconciliation completed No and provided to Patient/Care [name] [name]: Provided on Clinical Summary of Care: 07/08/2016 Form Type Recipient Paper Patient RR Electronic Signature(s) Signed: 07/08/2016 9:42:09 AM By: Montey Hora Previous Signature: 07/08/2016 8:48:15 AM Version By: Ruthine Dose Entered By: Montey Hora on 07/08/2016 09:42:08 Murlean Caller ([id number]) -------------------------------------------------------------------------------- Lower Extremity Assessment Details Patient Name: [name] Sours T. Date of Service: 07/08/2016 8:00 AM Medical Record Number: [id number] Patient Account Number: [id number] Date of Birth/Sex: [birth year] (75 y.o. Male) Treating RN: Montey Hora Primary Care Physician: [name] Other Clinician: Referring Physician: [name] Treating Physician/Extender: Frann Rider in Treatment: 2 Edema Assessment Assessed: [Left: No] [Right: No] Edema: [Left: Ye] [Right: s] Calf Left: Right: Point of Measurement: 32 cm From Medial Instep 32.6 cm cm Ankle Left: Right: Point of Measurement: 11 cm From Medial Instep 23.9 cm cm Vascular Assessment Pulses: Posterior Tibial Dorsalis Pedis Palpable: [Left:Yes] Extremity colors, hair growth, and conditions: Extremity Color: [Left:Hyperpigmented] Hair Growth on Extremity: [Left:No] Temperature of Extremity: [Left:Warm] Capillary Refill: [Left:< 3 seconds] Electronic Signature(s) Signed: 07/08/2016 4:35:40 PM By: Montey Hora Entered By: Montey Hora on 07/08/2016 08:21:23 ROTENFreddi [name] ([id number]) -------------------------------------------------------------------------------- Multi Wound Chart Details Patient Name: [name] Sours T. Date of Service: 07/08/2016 8:00 AM Medical Record Number: [id number] Patient Account Number: [id number] Date of Birth/Sex: 04/24/1941 (75 y.o. Male) Treating RN: Montey Hora Primary Care Physician: [name] Other Clinician: Referring Physician: [name] Treating Physician/Extender: Frann Rider in Treatment: 2 Vital Signs Height(in): 69 Pulse(bpm): 72 Weight(lbs): 182 Blood Pressure 142/71 (mmHg): Body Mass Index(BMI): 27 Temperature(F): 98.2 Respiratory Rate 20 (breaths/min): Photos: [N/A:N/A] Wound Location: Left Lower Leg - N/A N/A Circumfernential Wounding Event: Trauma N/A N/A Primary Etiology: Trauma, Other N/A N/A Comorbid History: Asthma, Hypertension, N/A N/A Hepatitis C, Type II Diabetes, Osteoarthritis Date Acquired: 06/12/2016 N/A N/A Weeks of Treatment: 2 N/A N/A Wound Status: Open N/A N/A Measurements L x W x D 5x15.7x0.2 N/A N/A (cm) Area (cm) : 61.654 N/A N/A Volume (cm) : 12.331 N/A N/A % Reduction in Area: 67.40% N/A N/A %  Reduction in Volume: 34.90% N/A N/A [name] [name] [name] T. ([id number]) Classification: Partial Thickness N/A N/A HBO Classification: Grade 1 N/A N/A Exudate Amount: Large N/A N/A Exudate Type: Serosanguineous N/A N/A Exudate Color: red, brown N/A N/A Wound Margin: Flat and Intact N/A N/A Granulation Amount: Small (1-33%) N/A N/A Granulation Quality: Red N/A N/A Necrotic Amount: Large (67-100%) N/A N/A Necrotic Tissue: Eschar, Adherent Slough N/A N/A Exposed Structures: Fascia: No N/A N/A Fat: No Tendon: No Muscle: No Joint: No Bone: No Limited to Skin Breakdown Epithelialization: Small (1-33%) N/A N/A Periwound Skin Texture: Edema: Yes N/A N/A Periwound Skin Moist: Yes N/A N/A Moisture: Periwound Skin Color: Erythema: Yes N/A N/A Erythema Location: Circumferential N/A N/A Temperature: No Abnormality N/A N/A Tenderness on Yes N/A N/A Palpation: Wound Preparation: Ulcer Cleansing: Other: N/A N/A soap and water Topical Anesthetic Applied: Other: lidocaine 4% Treatment Notes Electronic Signature(s) Signed: 07/08/2016 4:35:40 PM By: Montey Hora Entered By: Montey Hora on 07/08/2016  08:35:15 Murlean Caller ([id number]) -------------------------------------------------------------------------------- [place] Details Patient Name: [name] Sours T. Date of Service: 07/08/2016 8:00 AM Medical Record Number: [id number] Patient Account Number: [id number] Date of Birth/Sex: September 21, 1941 (75 y.o. Male) Treating RN: Montey Hora Primary Care Physician: [name] Other Clinician: Referring Physician: [name] Treating Physician/Extender: Frann Rider in Treatment: 2 Active Inactive Orientation to the Wound Care Program Nursing Diagnoses: Knowledge deficit related to the wound healing center program Goals: Patient/caregiver will verbalize understanding of the [place] Program Date Initiated: 06/24/2016 Goal Status: Active Interventions: Provide education on orientation to the wound center Notes: Pain, Acute or Chronic Nursing Diagnoses: Pain, acute or chronic: actual or potential Goals: Patient will verbalize adequate pain control and receive pain control interventions during procedures as needed Date Initiated: 06/24/2016 Goal Status: Active Patient/caregiver will verbalize adequate pain control between visits Date Initiated: 06/24/2016 Goal Status: Active Interventions: Assess comfort goal upon admission Complete pain assessment as per visit requirements Notes: Soft Tissue Infection Nursing Diagnoses: [name] [name] [name] [name] ([id number]) Impaired tissue integrity Goals: Patient/caregiver will verbalize understanding of or measures to prevent infection and contamination in the home setting Date Initiated: 06/24/2016 Goal Status: Active Patient's soft tissue infection will resolve Date Initiated: 06/24/2016 Goal Status: Active Interventions: Assess signs and symptoms of infection every visit Notes: Wound/Skin Impairment Nursing Diagnoses: Impaired tissue integrity Goals: Ulcer/skin breakdown will have a  volume reduction of 30% by week 4 Date Initiated: 06/24/2016 Goal Status: Active Ulcer/skin breakdown will have a volume reduction of 50% by week 8 Date Initiated: 06/24/2016 Goal Status: Active Ulcer/skin breakdown will have a volume reduction of 80% by week 12 Date Initiated: 06/24/2016 Goal Status: Active Interventions: Assess patient/caregiver ability to perform ulcer/skin care regimen upon admission and as needed Assess ulceration(s) every visit Notes: Electronic Signature(s) Signed: 07/08/2016 4:35:40 PM By: Montey Hora Entered By: Montey Hora on 07/08/2016 08:35:06 Murlean Caller ([id number]) -------------------------------------------------------------------------------- Pain Assessment Details Patient Name: [name] Sours T. Date of Service: 07/08/2016 8:00 AM Medical Record Number: [id number] Patient Account Number: [id number] Date of Birth/Sex: 1940-12-17 (75 y.o. Male) Treating RN: Montey Hora Primary Care Physician: [name] Other Clinician: Referring Physician: [name] Treating Physician/Extender: Frann Rider in Treatment: 2 Active Problems Location of Pain Severity and Description of Pain Patient Has Paino Yes Site Locations Pain Location: Pain in Ulcers With Dressing Change: Yes Duration of the Pain. Constant / Intermittento Constant Pain Management and Medication Current Pain Management: Notes Topical or injectable lidocaine is offered to patient for acute pain when surgical debridement is  performed. If needed, Patient is instructed to use over the counter pain medication for the following 24-48 hours after debridement. Wound care MDs do not prescribed pain medications. Patient has chronic pain or uncontrolled pain. Patient has been instructed to make an appointment with their Primary Care Physician for pain management. Electronic Signature(s) Signed: 07/08/2016 4:35:40 PM By: Montey Hora Entered By: Montey Hora on  07/08/2016 08:11:01 Murlean Caller ([id number]) -------------------------------------------------------------------------------- Patient/Caregiver Education Details Patient Name: [name] Sours T. Date of Service: 07/08/2016 8:00 AM Medical Record Number: [id number] Patient Account Number: [id number] Date of Birth/Gender: Sep 19, 1941 (75 y.o. Male) Treating RN: Montey Hora Primary Care Physician: [name] Other Clinician: Referring Physician: [name] Treating Physician/Extender: Frann Rider in Treatment: 2 Education Assessment Education Provided To: Patient Education Topics Provided Wound/Skin Impairment: Handouts: Other: wound carea s ordered Methods: Demonstration, Explain/Verbal Responses: State content correctly Electronic Signature(s) Signed: 07/08/2016 4:35:40 PM By: Montey Hora Entered By: Montey Hora on 07/08/2016 09:42:23 ROTENFreddi [name] ([id number]) -------------------------------------------------------------------------------- Wound Assessment Details Patient Name: [name] Sours T. Date of Service: 07/08/2016 8:00 AM Medical Record Number: [id number] Patient Account Number: [id number] Date of Birth/Sex: 1941-07-25 (75 y.o. Male) Treating RN: Montey Hora Primary Care Physician: [name] Other Clinician: Referring Physician: [name] Treating Physician/Extender: Frann Rider in Treatment: 2 Wound Status Wound Number: 1 Primary Trauma, Other Etiology: Wound Location: Left Lower Leg - Circumfernential Wound Open Status: Wounding Event: Trauma Comorbid Asthma, Hypertension, Hepatitis C, Date Acquired: 06/12/2016 History: Type II Diabetes, Osteoarthritis Weeks Of Treatment: 2 Clustered Wound: No Photos Wound Measurements Length: (cm) 5 Width: (cm) 15.7 Depth: (cm) 0.2 Area: (cm) 61.654 Volume: (cm) 12.331 % Reduction in Area: 67.4% % Reduction in Volume: 34.9% Epithelialization: Small  (1-33%) Tunneling: No Undermining: No Wound Description Classification: Partial Thickness Diabetic Severity (Wagner): Grade 1 Wound Margin: Flat and Intact Exudate Amount: Large Exudate Type: Serosanguineous Exudate Color: red, brown Foul Odor After Cleansing: No Wound Bed Granulation Amount: Small (1-33%) Exposed Structure Granulation Quality: Red Fascia Exposed: No Necrotic Amount: Large (67-100%) Fat Layer Exposed: No [name] [name] [name] T. ([id number]) Necrotic Quality: Eschar, Adherent Slough Tendon Exposed: No Muscle Exposed: No Joint Exposed: No Bone Exposed: No Limited to Skin Breakdown Periwound Skin Texture Texture Color No Abnormalities Noted: No No Abnormalities Noted: No Localized Edema: Yes Erythema: Yes Erythema Location: Circumferential Moisture No Abnormalities Noted: No Temperature / Pain Moist: Yes Temperature: No Abnormality Tenderness on Palpation: Yes Wound Preparation Ulcer Cleansing: Other: soap and water, Topical Anesthetic Applied: Other: lidocaine 4%, Treatment Notes Wound #1 (Left, Circumferential Lower Leg) 1. Cleansed with: Clean wound with Normal Saline 2. Anesthetic Topical Lidocaine 4% cream to wound bed prior to debridement 4. Dressing Applied: Aquacel Ag 5. Secondary Dressing Applied ABD and Kerlix/Conform Electronic Signature(s) Signed: 07/08/2016 4:35:40 PM By: Montey Hora Entered By: Montey Hora on 07/08/2016 08:34:49 Murlean Caller ([id number]) -------------------------------------------------------------------------------- [place] Details Patient Name: [name] Sours T. Date of Service: 07/08/2016 8:00 AM Medical Record Number: [id number] Patient Account Number: [id number] Date of Birth/Sex: 1941/10/05 (75 y.o. Male) Treating RN: Montey Hora Primary Care Physician: [name] Other Clinician: Referring Physician: [name] Treating Physician/Extender: Frann Rider in Treatment: 2 Vital  Signs Time Taken: 08:11 Temperature (F): 98.2 Height (in): 69 Pulse (bpm): 72 Weight (lbs): 182 Respiratory Rate (breaths/min): 20 Body Mass Index (BMI): 26.9 Blood Pressure (mmHg): 142/71 Reference Range: 80 - 120 mg / dl Electronic Signature(s) Signed: 07/08/2016 4:35:40 PM By: Montey Hora Entered By: Montey Hora on 07/08/2016 08:12:18
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
## 2016-07-15 ENCOUNTER — Encounter: Payer: PPO | Admitting: Surgery

## 2016-07-15 DIAGNOSIS — S81812D Laceration without foreign body, left lower leg, subsequent encounter: Secondary | ICD-10-CM | POA: Diagnosis not present

## 2016-07-18 NOTE — Progress Notes (Signed)
XAN, JASSO ([id number]) Visit Report for 07/15/2016 Arrival Information Details Patient Name: [name], [name]. Date of Service: 07/15/2016 8:45 AM Medical Record Number: [id number] Patient Account Number: [id number] Date of Birth/Sex: 1941/09/20 (75 y.o. Male) Treating RN: Cornell Barman Primary Care Physician: [name] Other Clinician: Referring Physician: [name] Treating Physician/Extender: Frann Rider in Treatment: 3 Visit Information History Since Last Visit Added or deleted any medications: No Patient Arrived: [name] Any new allergies or adverse reactions: No Arrival Time: 08:49 Had a fall or experienced change in No Accompanied By: self activities of daily living that may affect Transfer Assistance: None risk of falls: Patient Identification Verified: Yes Signs or symptoms of abuse/neglect No Secondary Verification Process Completed: Yes since last visito Patient Requires Transmission-Based No Hospitalized since last visit: No Precautions: Has Dressing in Place as Prescribed: Yes Patient Has Alerts: Yes Has Footwear/Offloading in Place as Yes Prescribed: Left: Removable Cast Walker/Walking Boot Pain Present Now: Yes Electronic Signature(s) Signed: 07/17/2016 5:06:33 PM By: Gretta Cool, RN, BSN, Kim RN, BSN Entered By: Gretta Cool, RN, BSN, Kim on 07/15/2016 08:49:30 [name] ([id number]) -------------------------------------------------------------------------------- Encounter Discharge Information Details Patient Name: John Sours T. Date of Service: 07/15/2016 8:45 AM Medical Record Number: [id number] Patient Account Number: [id number] Date of Birth/Sex: [birth year] (75 y.o. Male) Treating RN: Cornell Barman Primary Care Physician: [name] Other Clinician: Referring Physician: [name] Treating Physician/Extender: Frann Rider in Treatment: 3 Encounter Discharge Information Items Discharge Pain Level: 0 Discharge  Condition: Stable Ambulatory Status: Cane Discharge Destination: Home Transportation: Private Auto Accompanied By: self Schedule Follow-up Appointment: Yes Medication Reconciliation completed Yes and provided to Patient/Care [name]: Provided on Clinical Summary of Care: 07/15/2016 Form Type Recipient Paper Patient RR Electronic Signature(s) Signed: 07/17/2016 5:06:33 PM By: Gretta Cool RN, BSN, Kim RN, BSN Previous Signature: 07/15/2016 9:15:30 AM Version By: Ruthine Dose Entered By: Gretta Cool RN, BSN, Kim on 07/15/2016 09:19:39 [name] ([id number]) -------------------------------------------------------------------------------- Lower Extremity Assessment Details Patient Name: John Sours T. Date of Service: 07/15/2016 8:45 AM Medical Record Number: [id number] Patient Account Number: [id number] Date of Birth/Sex: [birth year] (75 y.o. Male) Treating RN: Cornell Barman Primary Care Physician: [name] Other Clinician: Referring Physician: [name] Treating Physician/Extender: Frann Rider in Treatment: 3 Edema Assessment Assessed: [Left: No] [Right: No] E[Left: dema] [Right: :] Calf Left: Right: Point of Measurement: 32 cm From Medial Instep 31.8 cm cm Ankle Left: Right: Point of Measurement: 11 cm From Medial Instep 24 cm cm Vascular Assessment Pulses: Posterior Tibial Dorsalis Pedis Palpable: [Left:Yes] Extremity colors, hair growth, and conditions: Extremity Color: [Left:Normal] Hair Growth on Extremity: [Left:Yes] Temperature of Extremity: [Left:Warm] Capillary Refill: [Left:< 3 seconds] Toe Nail Assessment Left: Right: Thick: No Discolored: No Deformed: No Improper Length and Hygiene: No Electronic Signature(s) Signed: 07/17/2016 5:06:33 PM By: Gretta Cool, RN, BSN, Kim RN, BSN Entered By: Gretta Cool, RN, BSN, Kim on 07/15/2016 08:56:58 [name]  ([id number]) -------------------------------------------------------------------------------- Multi Wound Chart Details Patient Name: John Sours T. Date of Service: 07/15/2016 8:45 AM Medical Record Number: [id number] Patient Account Number: [id number] Date of Birth/Sex: Sep 15, 1941 (75 y.o. Male) Treating RN: Cornell Barman Primary Care Physician: [name] Other Clinician: Referring Physician: [name] Treating Physician/Extender: Frann Rider in Treatment: 3 Vital Signs Height(in): 69 Pulse(bpm): 86 Weight(lbs): 182 Blood Pressure 151/80 (mmHg): Body Mass Index(BMI): 27 Temperature(F): 98.2 Respiratory Rate 20 (breaths/min): Photos: [N/A:N/A] Wound Location: Left Lower Leg - N/A N/A Circumfernential Wounding Event: Trauma N/A N/A Primary Etiology: Trauma, Other N/A N/A Comorbid History: Asthma, Hypertension, N/A  N/A Hepatitis C, Type II Diabetes, Osteoarthritis Date Acquired: 06/12/2016 N/A N/A Weeks of Treatment: 3 N/A N/A Wound Status: Open N/A N/A Measurements L x W x D 3x12.5x0.2 N/A N/A (cm) Area (cm) : 29.452 N/A N/A Volume (cm) : 5.89 N/A N/A % Reduction in Area: 84.40% N/A N/A % Reduction in Volume: 68.90% N/A N/A [name], [name] ([id number]) Classification: Partial Thickness N/A N/A HBO Classification: Grade 1 N/A N/A Exudate Amount: Large N/A N/A Exudate Type: Serosanguineous N/A N/A Exudate Color: red, brown N/A N/A Wound Margin: Flat and Intact N/A N/A Granulation Amount: Small (1-33%) N/A N/A Granulation Quality: Red N/A N/A Necrotic Amount: Large (67-100%) N/A N/A Necrotic Tissue: Eschar, Adherent Slough N/A N/A Exposed Structures: Fascia: No N/A N/A Fat: No Tendon: No Muscle: No Joint: No Bone: No Limited to Skin Breakdown Epithelialization: Small (1-33%) N/A N/A Periwound Skin Texture: Edema: Yes N/A N/A Periwound Skin Moist: Yes N/A N/A Moisture: Periwound Skin Color: Erythema: Yes N/A N/A Erythema Location:  Circumferential N/A N/A Temperature: No Abnormality N/A N/A Tenderness on Yes N/A N/A Palpation: Wound Preparation: Ulcer Cleansing: N/A N/A Rinsed/Irrigated with Saline Topical Anesthetic Applied: Other: lidocaine 4% Treatment Notes Electronic Signature(s) Signed: 07/17/2016 5:06:33 PM By: Gretta Cool, RN, BSN, Kim RN, BSN Entered By: Gretta Cool, RN, BSN, Kim on 07/15/2016 09:05:30 [name] ([id number]) -------------------------------------------------------------------------------- [facility] Details Patient Name: John Sours T. Date of Service: 07/15/2016 8:45 AM Medical Record Number: [id number] Patient Account Number: [id number] Date of Birth/Sex: June 05, 1941 (75 y.o. Male) Treating RN: Cornell Barman Primary Care Physician: [name] Other Clinician: Referring Physician: [name] Treating Physician/Extender: Frann Rider in Treatment: 3 Active Inactive Orientation to the Wound Care Program Nursing Diagnoses: Knowledge deficit related to the wound healing center program Goals: Patient/caregiver will verbalize understanding of the [place] [facility] Program Date Initiated: 06/24/2016 Goal Status: Active Interventions: Provide education on orientation to the wound center Notes: Pain, Acute or Chronic Nursing Diagnoses: Pain, acute or chronic: actual or potential Goals: Patient will verbalize adequate pain control and receive pain control interventions during procedures as needed Date Initiated: 06/24/2016 Goal Status: Active Patient/caregiver will verbalize adequate pain control between visits Date Initiated: 06/24/2016 Goal Status: Active Interventions: Assess comfort goal upon admission Complete pain assessment as per visit requirements Notes: Soft Tissue Infection Nursing Diagnoses: [name], [name] ([id number]) Impaired tissue integrity Goals: Patient/caregiver will verbalize understanding of or measures to prevent  infection and contamination in the home setting Date Initiated: 06/24/2016 Goal Status: Active Patient's soft tissue infection will resolve Date Initiated: 06/24/2016 Goal Status: Active Interventions: Assess signs and symptoms of infection every visit Notes: Wound/Skin Impairment Nursing Diagnoses: Impaired tissue integrity Goals: Ulcer/skin breakdown will have a volume reduction of 30% by week 4 Date Initiated: 06/24/2016 Goal Status: Active Ulcer/skin breakdown will have a volume reduction of 50% by week 8 Date Initiated: 06/24/2016 Goal Status: Active Ulcer/skin breakdown will have a volume reduction of 80% by week 12 Date Initiated: 06/24/2016 Goal Status: Active Interventions: Assess patient/caregiver ability to perform ulcer/skin care regimen upon admission and as needed Assess ulceration(s) every visit Notes: Electronic Signature(s) Signed: 07/17/2016 5:06:33 PM By: Gretta Cool, RN, BSN, Kim RN, BSN Entered By: Gretta Cool, RN, BSN, Kim on 07/15/2016 09:01:23 [name] ([id number]) -------------------------------------------------------------------------------- Pain Assessment Details Patient Name: John Sours T. Date of Service: 07/15/2016 8:45 AM Medical Record Number: [id number] Patient Account Number: [id number] Date of Birth/Sex: 1941-03-12 (75 y.o. Male) Treating RN: Cornell Barman Primary Care Physician: [name] Other Clinician: Referring Physician: [name]  Treating Physician/Extender: Frann Rider in Treatment: 3 Active Problems Location of Pain Severity and Description of Pain Patient Has Paino Yes Site Locations Pain Location: Pain in Ulcers Pain Management and Medication Current Pain Management: Notes Topical or injectable lidocaine is offered to patient for acute pain when surgical debridement is performed. If needed, Patient is instructed to use over the counter pain medication for the following 24-48 hours after debridement. Wound  care MDs do not prescribed pain medications. Patient has chronic pain or uncontrolled pain. Patient has been instructed to make an appointment with their Primary Care Physician for pain management. Electronic Signature(s) Signed: 07/17/2016 5:06:33 PM By: Gretta Cool, RN, BSN, Kim RN, BSN Entered By: Gretta Cool, RN, BSN, Kim on 07/15/2016 08:50:06 [name] ([id number]) -------------------------------------------------------------------------------- Patient/Caregiver Education Details Patient Name: John Sours T. Date of Service: 07/15/2016 8:45 AM Medical Record Number: [id number] Patient Account Number: [id number] Date of Birth/Gender: 1941/01/26 (75 y.o. Male) Treating RN: Cornell Barman Primary Care Physician: [name] Other Clinician: Referring Physician: [name] Treating Physician/Extender: Frann Rider in Treatment: 3 Education Assessment Education Provided To: Caregiver Education Topics Provided Wound/Skin Impairment: Handouts: Caring for Your Ulcer Methods: Demonstration, Explain/Verbal Responses: State content correctly Electronic Signature(s) Signed: 07/17/2016 5:06:33 PM By: Gretta Cool, RN, BSN, Kim RN, BSN Entered By: Gretta Cool, RN, BSN, Kim on 07/15/2016 09:19:59 [name] ([id number]) -------------------------------------------------------------------------------- Wound Assessment Details Patient Name: John Sours T. Date of Service: 07/15/2016 8:45 AM Medical Record Number: [id number] Patient Account Number: [id number] Date of Birth/Sex: [birth year] (75 y.o. Male) Treating RN: Cornell Barman Primary Care Physician: [name] Other Clinician: Referring Physician: [name] Treating Physician/Extender: Frann Rider in Treatment: 3 Wound Status Wound Number: 1 Primary Trauma, Other Etiology: Wound Location: Left Lower Leg - Circumfernential Wound Open Status: Wounding Event: Trauma Comorbid Asthma, Hypertension, Hepatitis  C, Date Acquired: 06/12/2016 History: Type II Diabetes, Osteoarthritis Weeks Of Treatment: 3 Clustered Wound: No Photos Wound Measurements Length: (cm) 3 Width: (cm) 12.5 Depth: (cm) 0.2 Area: (cm) 29.452 Volume: (cm) 5.89 % Reduction in Area: 84.4% % Reduction in Volume: 68.9% Epithelialization: Small (1-33%) Wound Description Classification: Partial Thickness Diabetic Severity (Wagner): Grade 1 Wound Margin: Flat and Intact Exudate Amount: Large Exudate Type: Serosanguineous Exudate Color: red, brown Foul Odor After Cleansing: No Wound Bed Granulation Amount: Small (1-33%) Exposed Structure Granulation Quality: Red Fascia Exposed: No Necrotic Amount: Large (67-100%) Fat Layer Exposed: No [name], [name] ([id number]) Necrotic Quality: Eschar, Adherent Slough Tendon Exposed: No Muscle Exposed: No Joint Exposed: No Bone Exposed: No Limited to Skin Breakdown Periwound Skin Texture Texture Color No Abnormalities Noted: No No Abnormalities Noted: No Localized Edema: Yes Erythema: Yes Erythema Location: Circumferential Moisture No Abnormalities Noted: No Temperature / Pain Moist: Yes Temperature: No Abnormality Tenderness on Palpation: Yes Wound Preparation Ulcer Cleansing: Rinsed/Irrigated with Saline Topical Anesthetic Applied: Other: lidocaine 4%, Treatment Notes Wound #1 (Left, Circumferential Lower Leg) 1. Cleansed with: Clean wound with Normal Saline 2. Anesthetic Topical Lidocaine 4% cream to wound bed prior to debridement 3. Peri-wound Care: Moisturizing lotion 4. Dressing Applied: Aquacel Ag 5. Secondary Dressing Applied ABD and Kerlix/Conform Notes Air boot applied by patient Electronic Signature(s) Signed: 07/17/2016 5:06:33 PM By: Gretta Cool, RN, BSN, Kim RN, BSN Entered By: Gretta Cool, RN, BSN, Kim on 07/15/2016 08:59:20 [name] ([id number]) -------------------------------------------------------------------------------- [place]  Details Patient Name: John Sours T. Date of Service: 07/15/2016 8:45 AM Medical Record Number: [id number] Patient Account Number: [id number] Date of Birth/Sex: 05/16/1941 (75 y.o. Male) Treating RN: Cornell Barman Primary Care  Physician: [name] Other Clinician: Referring Physician: [name] Treating Physician/Extender: Frann Rider in Treatment: 3 Vital Signs Time Taken: 08:51 Temperature (F): 98.2 Height (in): 69 Pulse (bpm): 86 Weight (lbs): 182 Respiratory Rate (breaths/min): 20 Body Mass Index (BMI): 26.9 Blood Pressure (mmHg): 151/80 Reference Range: 80 - 120 mg / dl Electronic Signature(s) Signed: 07/17/2016 5:06:33 PM By: Gretta Cool, RN, BSN, Kim RN, BSN Entered By: Gretta Cool, RN, BSN, Kim on 07/15/2016 08:53:14
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
## 2016-07-22 ENCOUNTER — Encounter: Payer: PPO | Admitting: Internal Medicine

## 2016-07-22 DIAGNOSIS — S81812D Laceration without foreign body, left lower leg, subsequent encounter: Secondary | ICD-10-CM | POA: Diagnosis not present
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
## 2016-07-23 NOTE — Progress Notes (Signed)
[name], John ([id number]) Visit Report for 07/22/2016 Arrival Information Details Patient Name: [name], [name]. Date of Service: 07/22/2016 8:45 AM Medical Record Number: [id number] Patient Account Number: [id number] Date of Birth/Sex: Aug 09, 1941 (75 y.o. Male) Treating RN: Ahmed Prima Primary Care Physician: [name] Other Clinician: Referring Physician: [name] Treating Physician/Extender: [name] in Treatment: 4 Visit Information History Since Last Visit All ordered tests and consults were completed: No Patient Arrived: [name] Added or deleted any medications: No Arrival Time: 08:46 Any new allergies or adverse reactions: No Accompanied By: self Had a fall or experienced change in No Transfer Assistance: None activities of daily living that may affect Patient Identification Verified: Yes risk of falls: Secondary Verification Process Completed: Yes Signs or symptoms of abuse/neglect since last No Patient Requires Transmission-Based No visito Precautions: Hospitalized since last visit: No Patient Has Alerts: Yes Pain Present Now: Yes Electronic Signature(s) Signed: 07/22/2016 3:40:23 PM By: Alric Quan Entered By: Alric Quan on 07/22/2016 08:47:29 Puskas, [name] ([id number]) -------------------------------------------------------------------------------- Clinic Level of Care Assessment Details Patient Name: John Sours T. Date of Service: 07/22/2016 8:45 AM Medical Record Number: [id number] Patient Account Number: [id number] Date of Birth/Sex: 03/02/1941 (75 y.o. Male) Treating RN: Carolyne Fiscal, Debi Primary Care Physician: [name] Other Clinician: Referring Physician: [name] Treating Physician/Extender: [name] in Treatment: 4 Clinic Level of Care Assessment Items TOOL 4 Quantity Score X - Use when only an EandM is performed on FOLLOW-UP visit 1 0 ASSESSMENTS - Nursing Assessment /  Reassessment X - Reassessment of Co-morbidities (includes updates in patient status) 1 10 X - Reassessment of Adherence to Treatment Plan 1 5 ASSESSMENTS - Wound and Skin Assessment / Reassessment X - Simple Wound Assessment / Reassessment - one wound 1 5 []  - Complex Wound Assessment / Reassessment - multiple wounds 0 []  - Dermatologic / Skin Assessment (not related to wound area) 0 ASSESSMENTS - Focused Assessment []  - Circumferential Edema Measurements - multi extremities 0 []  - Nutritional Assessment / Counseling / Intervention 0 []  - Lower Extremity Assessment (monofilament, tuning fork, pulses) 0 []  - Peripheral Arterial Disease Assessment (using hand held doppler) 0 ASSESSMENTS - Ostomy and/or Continence Assessment and Care []  - Incontinence Assessment and Management 0 []  - Ostomy Care Assessment and Management (repouching, etc.) 0 PROCESS - Coordination of Care X - Simple Patient / Family Education for ongoing care 1 15 []  - Complex (extensive) Patient / Family Education for ongoing care 0 []  - Staff obtains Programmer, systems, Records, Test Results / Process Orders 0 []  - Staff telephones HHA, Nursing Homes / Clarify orders / etc 0 []  - Routine Transfer to another Facility (non-emergent condition) 0 Corliss, Abdifatah T. ([id number]) []  - Routine Hospital Admission (non-emergent condition) 0 []  - New Admissions / Biomedical engineer / Ordering NPWT, Apligraf, etc. 0 []  - Emergency Hospital Admission (emergent condition) 0 X - Simple Discharge Coordination 1 10 []  - Complex (extensive) Discharge Coordination 0 PROCESS - Special Needs []  - Pediatric / Minor Patient Management 0 []  - Isolation Patient Management 0 []  - Hearing / Language / Visual special needs 0 []  - Assessment of Community assistance (transportation, D/C planning, etc.) 0 []  - Additional assistance / Altered mentation 0 []  - Support Surface(s) Assessment (bed, cushion, seat, etc.) 0 INTERVENTIONS - Wound Cleansing /  Measurement X - Simple Wound Cleansing - one wound 1 5 []  - Complex Wound Cleansing - multiple wounds 0 X - Wound Imaging (photographs - any number of wounds) 1  5 []  - Wound Tracing (instead of photographs) 0 []  - Simple Wound Measurement - one wound 0 X - Complex Wound Measurement - multiple wounds 1 5 INTERVENTIONS - Wound Dressings []  - Small Wound Dressing one or multiple wounds 0 X - Medium Wound Dressing one or multiple wounds 1 15 []  - Large Wound Dressing one or multiple wounds 0 X - Application of Medications - topical 1 5 []  - Application of Medications - injection 0 INTERVENTIONS - Miscellaneous []  - External ear exam 0 Denslow, Santiago T. ([id number]) []  - Specimen Collection (cultures, biopsies, blood, body fluids, etc.) 0 []  - Specimen(s) / Culture(s) sent or taken to Lab for analysis 0 []  - Patient Transfer (multiple staff / Harrel Lemon Lift / Similar devices) 0 []  - Simple Staple / Suture removal (25 or less) 0 []  - Complex Staple / Suture removal (26 or more) 0 []  - Hypo / Hyperglycemic Management (close monitor of Blood Glucose) 0 []  - Ankle / Brachial Index (ABI) - do not check if billed separately 0 X - Vital Signs 1 5 Has the patient been seen at the hospital within the last three years: Yes Total Score: 85 Level Of Care: New/Established - Level 3 Electronic Signature(s) Signed: 07/22/2016 3:40:23 PM By: Alric Quan Entered By: Alric Quan on 07/22/2016 10:40:01 Murlean Caller ([id number]) -------------------------------------------------------------------------------- Encounter Discharge Information Details Patient Name: John Sours T. Date of Service: 07/22/2016 8:45 AM Medical Record Number: [id number] Patient Account Number: [id number] Date of Birth/Sex: September 24, 1941 (75 y.o. Male) Treating RN: Ahmed Prima Primary Care Physician: [name] Other Clinician: Referring Physician: [name] Treating Physician/Extender: [name] in Treatment: 4 Encounter Discharge Information Items Discharge Pain Level: 0 Discharge Condition: Stable Ambulatory Status: Cane Discharge Destination: Home Transportation: Private Auto Accompanied By: self Schedule Follow-up Appointment: Yes Medication Reconciliation completed Yes and provided to Patient/Care Kyani Simkin: Provided on Clinical Summary of Care: 07/22/2016 Form Type Recipient Paper Patient RR Electronic Signature(s) Signed: 07/22/2016 9:12:43 AM By: Ruthine Dose Entered By: Ruthine Dose on 07/22/2016 09:12:43 Murlean Caller ([id number]) -------------------------------------------------------------------------------- Lower Extremity Assessment Details Patient Name: John Sours T. Date of Service: 07/22/2016 8:45 AM Medical Record Number: [id number] Patient Account Number: [id number] Date of Birth/Sex: October 26, 1941 (75 y.o. Male) Treating RN: Ahmed Prima Primary Care Physician: [name] Other Clinician: Referring Physician: [name] Treating Physician/Extender: [name] Weeks in Treatment: 4 Edema Assessment Assessed: [Left: No] [Right: No] E[Left: dema] [Right: :] Calf Left: Right: Point of Measurement: 32 cm From Medial Instep 31.8 cm cm Ankle Left: Right: Point of Measurement: 11 cm From Medial Instep 24.2 cm cm Vascular Assessment Pulses: Posterior Tibial Dorsalis Pedis Palpable: [Left:Yes] Extremity colors, hair growth, and conditions: Extremity Color: [Left:Normal] Temperature of Extremity: [Left:Warm] Capillary Refill: [Left:< 3 seconds] Toe Nail Assessment Left: Right: Thick: No Discolored: No Deformed: No Improper Length and Hygiene: No Electronic Signature(s) Signed: 07/22/2016 3:40:23 PM By: Alric Quan Entered By: Alric Quan on 07/22/2016 08:50:49 Maden, [name] ([id number]) -------------------------------------------------------------------------------- Multi Wound Chart  Details Patient Name: John Sours T. Date of Service: 07/22/2016 8:45 AM Medical Record Number: [id number] Patient Account Number: [id number] Date of Birth/Sex: 02-Oct-1941 (75 y.o. Male) Treating RN: Carolyne Fiscal, Debi Primary Care Physician: [name] Other Clinician: Referring Physician: [name] Treating Physician/Extender: [name] Weeks in Treatment: 4 Vital Signs Height(in): 69 Pulse(bpm): 71 Weight(lbs): 182 Blood Pressure 140/74 (mmHg): Body Mass Index(BMI): 27 Temperature(F): 97.8 Respiratory Rate 20 (breaths/min): Photos: [1:No Photos] [N/A:N/A] Wound Location: [1:Left Lower Leg - Circumfernential] [  N/A:N/A] Wounding Event: [1:Trauma] [N/A:N/A] Primary Etiology: [1:Trauma, Other] [N/A:N/A] Comorbid History: [1:Asthma, Hypertension, Hepatitis C, Type II Diabetes, Osteoarthritis] [N/A:N/A] Date Acquired: [1:06/12/2016] [N/A:N/A] Weeks of Treatment: [1:4] [N/A:N/A] Wound Status: [1:Open] [N/A:N/A] Measurements L x W x D 2.3x12.5x0.2 [N/A:N/A] (cm) Area (cm) : [1:22.58] [N/A:N/A] Volume (cm) : [1:4.516] [N/A:N/A] % Reduction in Area: [1:88.10%] [N/A:N/A] % Reduction in Volume: 76.10% [N/A:N/A] Classification: [1:Partial Thickness] [N/A:N/A] HBO Classification: [1:Grade 1] [N/A:N/A] Exudate Amount: [1:Large] [N/A:N/A] Exudate Type: [1:Serosanguineous] [N/A:N/A] Exudate Color: [1:red, brown] [N/A:N/A] Wound Margin: [1:Flat and Intact] [N/A:N/A] Granulation Amount: [1:Large (67-100%)] [N/A:N/A] Granulation Quality: [1:Red] [N/A:N/A] Necrotic Amount: [1:Small (1-33%)] [N/A:N/A] Exposed Structures: [1:Fascia: No Fat: No Tendon: No Muscle: No] [N/A:N/A] Joint: No Bone: No Limited to Skin Breakdown Epithelialization: Small (1-33%) N/A N/A Periwound Skin Texture: Edema: Yes N/A N/A Periwound Skin Moist: Yes N/A N/A Moisture: Periwound Skin Color: Erythema: Yes N/A N/A Erythema Location: Circumferential N/A N/A Temperature: No Abnormality  N/A N/A Tenderness on Yes N/A N/A Palpation: Wound Preparation: Ulcer Cleansing: N/A N/A Rinsed/Irrigated with Saline Topical Anesthetic Applied: Other: lidocaine 4% Treatment Notes Electronic Signature(s) Signed: 07/22/2016 3:40:23 PM By: Alric Quan Entered By: Alric Quan on 07/22/2016 08:55:16 Murlean Caller ([id number]) -------------------------------------------------------------------------------- [place] Details Patient Name: John Sours T. Date of Service: 07/22/2016 8:45 AM Medical Record Number: [id number] Patient Account Number: [id number] Date of Birth/Sex: [birth year] (75 y.o. Male) Treating RN: Ahmed Prima Primary Care Physician: [name] Other Clinician: Referring Physician: [name] Treating Physician/Extender: [name] in Treatment: 4 Active Inactive Orientation to the Wound Care Program Nursing Diagnoses: Knowledge deficit related to the wound healing center program Goals: Patient/caregiver will verbalize understanding of the [facility] Date Initiated: 06/24/2016 Goal Status: Active Interventions: Provide education on orientation to the wound center Notes: Pain, Acute or Chronic Nursing Diagnoses: Pain, acute or chronic: actual or potential Goals: Patient will verbalize adequate pain control and receive pain control interventions during procedures as needed Date Initiated: 06/24/2016 Goal Status: Active Patient/caregiver will verbalize adequate pain control between visits Date Initiated: 06/24/2016 Goal Status: Active Interventions: Assess comfort goal upon admission Complete pain assessment as per visit requirements Notes: Soft Tissue Infection Nursing Diagnoses: CARTER, AUTH ([id number]) Impaired tissue integrity Goals: Patient/caregiver will verbalize understanding of or measures to prevent infection and contamination in the home setting Date Initiated:  06/24/2016 Goal Status: Active Patient's soft tissue infection will resolve Date Initiated: 06/24/2016 Goal Status: Active Interventions: Assess signs and symptoms of infection every visit Notes: Wound/Skin Impairment Nursing Diagnoses: Impaired tissue integrity Goals: Ulcer/skin breakdown will have a volume reduction of 30% by week 4 Date Initiated: 06/24/2016 Goal Status: Active Ulcer/skin breakdown will have a volume reduction of 50% by week 8 Date Initiated: 06/24/2016 Goal Status: Active Ulcer/skin breakdown will have a volume reduction of 80% by week 12 Date Initiated: 06/24/2016 Goal Status: Active Interventions: Assess patient/caregiver ability to perform ulcer/skin care regimen upon admission and as needed Assess ulceration(s) every visit Notes: Electronic Signature(s) Signed: 07/22/2016 3:40:23 PM By: Alric Quan Entered By: Alric Quan on 07/22/2016 08:55:10 Murlean Caller ([id number]) -------------------------------------------------------------------------------- Pain Assessment Details Patient Name: John Sours T. Date of Service: 07/22/2016 8:45 AM Medical Record Number: [id number] Patient Account Number: [id number] Date of Birth/Sex: 26-Apr-1941 (75 y.o. Male) Treating RN: Ahmed Prima Primary Care Physician: [name] Other Clinician: Referring Physician: [name] Treating Physician/Extender: [name] Weeks in Treatment: 4 Active Problems Location of Pain Severity and Description of Pain Patient Has Paino Yes Site Locations Pain  Location: Pain in Ulcers With Dressing Change: Yes Duration of the Pain. Constant / Intermittento Constant Rate the pain. Current Pain Level: 6 Worst Pain Level: 10 Least Pain Level: 2 Character of Pain Describe the Pain: Sharp Pain Management and Medication Current Pain Management: Electronic Signature(s) Signed: 07/22/2016 3:40:23 PM By: Alric Quan Entered By: Alric Quan  on 07/22/2016 08:47:46 Gladu, [name] ([id number]) -------------------------------------------------------------------------------- Patient/Caregiver Education Details Patient Name: John Sours T. Date of Service: 07/22/2016 8:45 AM Medical Record Number: [id number] Patient Account Number: [id number] Date of Birth/Gender: August 17, 1941 (75 y.o. Male) Treating RN: Ahmed Prima Primary Care Physician: [name] Other Clinician: Referring Physician: [name] Treating Physician/Extender: [name] in Treatment: 4 Education Assessment Education Provided To: Patient Education Topics Provided Wound/Skin Impairment: Handouts: Other: change dressing as ordered Methods: Demonstration, Explain/Verbal Responses: State content correctly Electronic Signature(s) Signed: 07/22/2016 3:40:23 PM By: Alric Quan Entered By: Alric Quan on 07/22/2016 09:01:03 Murlean Caller ([id number]) -------------------------------------------------------------------------------- Wound Assessment Details Patient Name: John Sours T. Date of Service: 07/22/2016 8:45 AM Medical Record Number: [id number] Patient Account Number: [id number] Date of Birth/Sex: 01-10-1941 (75 y.o. Male) Treating RN: Carolyne Fiscal, Debi Primary Care Physician: [name] Other Clinician: Referring Physician: [name] Treating Physician/Extender: [name] Weeks in Treatment: 4 Wound Status Wound Number: 1 Primary Trauma, Other Etiology: Wound Location: Left Lower Leg - Circumfernential Wound Open Status: Wounding Event: Trauma Comorbid Asthma, Hypertension, Hepatitis C, Date Acquired: 06/12/2016 History: Type II Diabetes, Osteoarthritis Weeks Of Treatment: 4 Clustered Wound: No Photos Photo Uploaded By: Alric Quan on 07/22/2016 12:03:36 Wound Measurements Length: (cm) 2.3 Width: (cm) 12.5 Depth: (cm) 0.2 Area: (cm) 22.58 Volume: (cm) 4.516 % Reduction  in Area: 88.1% % Reduction in Volume: 76.1% Epithelialization: Small (1-33%) Tunneling: No Undermining: No Wound Description Classification: Partial Thickness Diabetic Severity (Wagner): Grade 1 Wound Margin: Flat and Intact Exudate Amount: Large Exudate Type: Serosanguineous Exudate Color: red, brown Foul Odor After Cleansing: No Wound Bed Granulation Amount: Large (67-100%) Exposed Structure Granulation Quality: Red Fascia Exposed: No Billinger, Infant T. ([id number]) Necrotic Amount: Small (1-33%) Fat Layer Exposed: No Necrotic Quality: Adherent Slough Tendon Exposed: No Muscle Exposed: No Joint Exposed: No Bone Exposed: No Limited to Skin Breakdown Periwound Skin Texture Texture Color No Abnormalities Noted: No No Abnormalities Noted: No Localized Edema: Yes Erythema: Yes Erythema Location: Circumferential Moisture No Abnormalities Noted: No Temperature / Pain Moist: Yes Temperature: No Abnormality Tenderness on Palpation: Yes Wound Preparation Ulcer Cleansing: Rinsed/Irrigated with Saline Topical Anesthetic Applied: Other: lidocaine 4%, Treatment Notes Wound #1 (Left, Circumferential Lower Leg) 1. Cleansed with: Clean wound with Normal Saline 2. Anesthetic Topical Lidocaine 4% cream to wound bed prior to debridement 4. Dressing Applied: Aquacel Ag 5. Secondary Dressing Applied Bordered Foam Dressing Dry Gauze Kerlix/Conform 7. Secured with Medical illustrator applied by Patent examiner) Signed: 07/22/2016 3:40:23 PM By: Alric Quan Entered By: Alric Quan on 07/22/2016 08:55:05 Murlean Caller ([id number]) -------------------------------------------------------------------------------- Vitals Details Patient Name: John Sours T. Date of Service: 07/22/2016 8:45 AM Medical Record Number: [id number] Patient Account Number: [id number] Date of Birth/Sex: [birth year] (75 y.o. Male) Treating RN: Carolyne Fiscal, Debi Primary Care  Physician: [name] Other Clinician: Referring Physician: [name] Treating Physician/Extender: [name] Weeks in Treatment: 4 Vital Signs Time Taken: 08:47 Temperature (F): 97.8 Height (in): 69 Pulse (bpm): 71 Weight (lbs): 182 Respiratory Rate (breaths/min): 20 Body Mass Index (BMI): 26.9 Blood Pressure (mmHg): 140/74 Reference Range: 80 - 120 mg / dl Electronic Signature(s) Signed: 07/22/2016  3:40:23 PM By: Alric Quan Entered By: Alric Quan on 07/22/2016 08:49:38

## 2016-07-29 ENCOUNTER — Encounter: Payer: PPO | Admitting: Internal Medicine

## 2016-07-29 DIAGNOSIS — S81812D Laceration without foreign body, left lower leg, subsequent encounter: Secondary | ICD-10-CM | POA: Diagnosis not present

## 2016-07-31 NOTE — Progress Notes (Signed)
[name], [name] ([id number]) Visit Report for 07/29/2016 Arrival Information Details Patient Name: [name], [name]. Date of Service: 07/29/2016 8:45 AM Medical Record Number: [id number] Patient Account Number: [id number] Date of Birth/Sex: 1940-12-13 (75 y.o. Male) Treating RN: Montey Hora Primary Care Physician: [name] Other Clinician: Referring Physician: [name] Treating Physician/Extender: [name] in Treatment: 5 Visit Information History Since Last Visit Added or deleted any medications: No Patient Arrived: Cane Any new allergies or adverse reactions: No Arrival Time: 08:35 Had a fall or experienced change in No Accompanied By: self activities of daily living that may affect Transfer Assistance: None risk of falls: Patient Identification Verified: Yes Signs or symptoms of abuse/neglect since last No Secondary Verification Process Yes visito Completed: Hospitalized since last visit: No Patient Requires Transmission-Based No Pain Present Now: Yes Precautions: Patient Has Alerts: Yes Patient Alerts: DM Type II Electronic Signature(s) Signed: 07/29/2016 1:15:57 PM By: Gretta Cool, RN, BSN, Kim RN, BSN Entered By: Gretta Cool, RN, BSN, Kim on 07/29/2016 13:15:56 [name] ([id number]) -------------------------------------------------------------------------------- Encounter Discharge Information Details Patient Name: John Sours T. Date of Service: 07/29/2016 8:45 AM Medical Record Number: [id number] Patient Account Number: [id number] Date of Birth/Sex: 1941/10/25 (75 y.o. Male) Treating RN: Montey Hora Primary Care Physician: [name] Other Clinician: Referring Physician: [name] Treating Physician/Extender: [name] in Treatment: 5 Encounter Discharge Information Items Discharge Pain Level: 0 Discharge Condition: Stable Ambulatory Status: Cane Discharge Destination: Home Transportation: Private  Auto Accompanied By: self Schedule Follow-up Appointment: Yes Medication Reconciliation completed No and provided to Patient/Care Jenevieve Kirschbaum: Provided on Clinical Summary of Care: 07/29/2016 Form Type Recipient Paper Patient RR Electronic Signature(s) Signed: 07/29/2016 8:59:08 AM By: Ruthine Dose Entered By: Ruthine Dose on 07/29/2016 08:59:08 [name] ([id number]) -------------------------------------------------------------------------------- Lower Extremity Assessment Details Patient Name: John Sours T. Date of Service: 07/29/2016 8:45 AM Medical Record Number: [id number] Patient Account Number: [id number] Date of Birth/Sex: [birth year] (75 y.o. Male) Treating RN: Montey Hora Primary Care Physician: [name] Other Clinician: Referring Physician: [name] Treating Physician/Extender: [name] Weeks in Treatment: 5 Edema Assessment Assessed: [Left: No] [Right: No] Edema: [Left: Ye] [Right: s] Calf Left: Right: Point of Measurement: 32 cm From Medial Instep cm cm Ankle Left: Right: Point of Measurement: 11 cm From Medial Instep cm cm Vascular Assessment Pulses: Posterior Tibial Dorsalis Pedis Palpable: [Left:Yes] Extremity colors, hair growth, and conditions: Extremity Color: [Left:Normal] Hair Growth on Extremity: [Left:No] Temperature of Extremity: [Left:Warm] Capillary Refill: [Left:< 3 seconds] Electronic Signature(s) Signed: 07/29/2016 3:49:08 PM By: Montey Hora Entered By: Montey Hora on 07/29/2016 08:50:54 [name], [name] ([id number]) -------------------------------------------------------------------------------- Multi Wound Chart Details Patient Name: John Sours T. Date of Service: 07/29/2016 8:45 AM Medical Record Number: [id number] Patient Account Number: [id number] Date of Birth/Sex: [birth year] (75 y.o. Male) Treating RN: Montey Hora Primary Care Physician: [name] Other Clinician: Referring  Physician: [name] Treating Physician/Extender: [name] Weeks in Treatment: 5 Vital Signs Height(in): 69 Pulse(bpm): 77 Weight(lbs): 182 Blood Pressure 140/70 (mmHg): Body Mass Index(BMI): 27 Temperature(F): 97.7 Respiratory Rate 18 (breaths/min): Photos: [N/A:N/A] Wound Location: Left Lower Leg - Medial Left Lower Leg - Posterior N/A Wounding Event: Trauma Trauma N/A Primary Etiology: Trauma, Other Trauma, Other N/A Comorbid History: Asthma, Hypertension, Asthma, Hypertension, N/A Hepatitis C, Type II Hepatitis C, Type II Diabetes, Osteoarthritis Diabetes, Osteoarthritis Date Acquired: 06/12/2016 06/12/2016 N/A Weeks of Treatment: 5 0 N/A Wound Status: Open Open N/A Measurements L x W x D 1x1.1x0.1 1.6x0.5x0.1 N/A (cm) Area (cm) :  0.864 0.628 N/A Volume (cm) : 0.086 0.063 N/A % Reduction in Area: 99.50% 0.00% N/A % Reduction in Volume: 99.50% 0.00% N/A Classification: Partial Thickness Partial Thickness N/A HBO Classification: Grade 1 Grade 1 N/A Exudate Amount: Large Medium N/A Exudate Type: Serosanguineous Serous N/A Exudate Color: red, brown amber N/A Wound Margin: Flat and Intact Flat and Intact N/A Granulation Amount: Large (67-100%) None Present (0%) N/A Granulation Quality: Red N/A N/A [name], [name] ([id number]) Necrotic Amount: None Present (0%) Large (67-100%) N/A Necrotic Tissue: N/A Eschar N/A Exposed Structures: Fascia: No Fascia: No N/A Fat: No Fat: No Tendon: No Tendon: No Muscle: No Muscle: No Joint: No Joint: No Bone: No Bone: No Limited to Skin Limited to Skin Breakdown Breakdown Epithelialization: Large (67-100%) None N/A Periwound Skin Texture: Edema: Yes Edema: No N/A Excoriation: No Induration: No Callus: No Crepitus: No Fluctuance: No Friable: No Rash: No Scarring: No Periwound Skin Moist: Yes Maceration: No N/A Moisture: Moist: No Dry/Scaly: No Periwound Skin Color: Erythema: Yes Atrophie  Blanche: No N/A Cyanosis: No Ecchymosis: No Erythema: No Hemosiderin Staining: No Mottled: No Pallor: No Rubor: No Erythema Location: Circumferential N/A N/A Temperature: No Abnormality No Abnormality N/A Tenderness on Yes No N/A Palpation: Wound Preparation: Ulcer Cleansing: Ulcer Cleansing: N/A Rinsed/Irrigated with Rinsed/Irrigated with Saline Saline Topical Anesthetic Topical Anesthetic Applied: Other: lidocaine Applied: Other: lidocaine 4% 4% Treatment Notes Electronic Signature(s) Signed: 07/29/2016 3:49:08 PM By: Montey Hora Entered By: Montey Hora on 07/29/2016 08:49:31 [name], [name] ([id number]) [name] ([id number]) -------------------------------------------------------------------------------- [place] Details Patient Name: John Sours T. Date of Service: 07/29/2016 8:45 AM Medical Record Number: [id number] Patient Account Number: [id number] Date of Birth/Sex: Jul 18, 1941 (75 y.o. Male) Treating RN: Montey Hora Primary Care Physician: [name] Other Clinician: Referring Physician: [name] Treating Physician/Extender: [name] in Treatment: 5 Active Inactive Orientation to the Wound Care Program Nursing Diagnoses: Knowledge deficit related to the wound healing center program Goals: Patient/caregiver will verbalize understanding of the [facility] Date Initiated: 06/24/2016 Goal Status: Active Interventions: Provide education on orientation to the wound center Notes: Pain, Acute or Chronic Nursing Diagnoses: Pain, acute or chronic: actual or potential Goals: Patient will verbalize adequate pain control and receive pain control interventions during procedures as needed Date Initiated: 06/24/2016 Goal Status: Active Patient/caregiver will verbalize adequate pain control between visits Date Initiated: 06/24/2016 Goal Status: Active Interventions: Assess comfort goal  upon admission Complete pain assessment as per visit requirements Notes: Soft Tissue Infection Nursing Diagnoses: COLLAN, FACCHINI ([id number]) Impaired tissue integrity Goals: Patient/caregiver will verbalize understanding of or measures to prevent infection and contamination in the home setting Date Initiated: 06/24/2016 Goal Status: Active Patient's soft tissue infection will resolve Date Initiated: 06/24/2016 Goal Status: Active Interventions: Assess signs and symptoms of infection every visit Notes: Wound/Skin Impairment Nursing Diagnoses: Impaired tissue integrity Goals: Ulcer/skin breakdown will have a volume reduction of 30% by week 4 Date Initiated: 06/24/2016 Goal Status: Active Ulcer/skin breakdown will have a volume reduction of 50% by week 8 Date Initiated: 06/24/2016 Goal Status: Active Ulcer/skin breakdown will have a volume reduction of 80% by week 12 Date Initiated: 06/24/2016 Goal Status: Active Interventions: Assess patient/caregiver ability to perform ulcer/skin care regimen upon admission and as needed Assess ulceration(s) every visit Notes: Electronic Signature(s) Signed: 07/29/2016 3:49:08 PM By: Montey Hora Entered By: Montey Hora on 07/29/2016 08:49:23 [name], [name] ([id number]) -------------------------------------------------------------------------------- Pain Assessment Details Patient Name: John Sours T. Date of Service: 07/29/2016 8:45 AM Medical Record  Number: [id number] Patient Account Number: [id number] Date of Birth/Sex: 08-Mar-1941 (75 y.o. Male) Treating RN: Montey Hora Primary Care Physician: [name] Other Clinician: Referring Physician: [name] Treating Physician/Extender: [name] Weeks in Treatment: 5 Active Problems Location of Pain Severity and Description of Pain Patient Has Paino Yes Site Locations Pain Location: Generalized Pain With Dressing Change: No Duration of the  Pain. Constant / Intermittento Constant Pain Management and Medication Current Pain Management: Notes Topical or injectable lidocaine is offered to patient for acute pain when surgical debridement is performed. If needed, Patient is instructed to use over the counter pain medication for the following 24-48 hours after debridement. Wound care MDs do not prescribed pain medications. Patient has chronic pain or uncontrolled pain. Patient has been instructed to make an appointment with their Primary Care Physician for pain management. Electronic Signature(s) Signed: 07/29/2016 3:49:08 PM By: Montey Hora Entered By: Montey Hora on 07/29/2016 08:37:18 [name] ([id number]) -------------------------------------------------------------------------------- Patient/Caregiver Education Details Patient Name: John Sours T. Date of Service: 07/29/2016 8:45 AM Medical Record Number: [id number] Patient Account Number: [id number] Date of Birth/Gender: 1940/12/21 (75 y.o. Male) Treating RN: Montey Hora Primary Care Physician: [name] Other Clinician: Referring Physician: [name] Treating Physician/Extender: [name] in Treatment: 5 Education Assessment Education Provided To: Patient Education Topics Provided Wound/Skin Impairment: Handouts: Other: wound care to continue as ordered Methods: Demonstration, Explain/Verbal Responses: State content correctly Electronic Signature(s) Signed: 07/29/2016 3:49:08 PM By: Montey Hora Entered By: Montey Hora on 07/29/2016 08:52:41 [name], [name] ([id number]) -------------------------------------------------------------------------------- Wound Assessment Details Patient Name: John Sours T. Date of Service: 07/29/2016 8:45 AM Medical Record Number: [id number] Patient Account Number: [id number] Date of Birth/Sex: 06/19/1941 (75 y.o. Male) Treating RN: Montey Hora Primary Care Physician: [name] Other Clinician: Referring Physician: [name] Treating Physician/Extender: [name] Weeks in Treatment: 5 Wound Status Wound Number: 1 Primary Trauma, Other Etiology: Wound Location: Left Lower Leg - Medial Wound Open Wounding Event: Trauma Status: Date Acquired: 06/12/2016 Comorbid Asthma, Hypertension, Hepatitis C, Weeks Of Treatment: 5 History: Type II Diabetes, Osteoarthritis Clustered Wound: No Photos Wound Measurements Length: (cm) 1 Width: (cm) 1.1 Depth: (cm) 0.1 Area: (cm) 0.864 Volume: (cm) 0.086 % Reduction in Area: 99.5% % Reduction in Volume: 99.5% Epithelialization: Large (67-100%) Tunneling: No Undermining: No Wound Description Classification: Partial Thickness Foul Odo Diabetic Severity (Wagner): Grade 1 Wound Margin: Flat and Intact Exudate Amount: Large Exudate Type: Serosanguineous Exudate Color: red, brown r After Cleansing: No Wound Bed Granulation Amount: Large (67-100%) Exposed Structure Granulation Quality: Red Fascia Exposed: No Necrotic Amount: None Present (0%) Fat Layer Exposed: No Tendon Exposed: No [name], [name] ([id number]) Muscle Exposed: No Joint Exposed: No Bone Exposed: No Limited to Skin Breakdown Periwound Skin Texture Texture Color No Abnormalities Noted: No No Abnormalities Noted: No Localized Edema: Yes Erythema: Yes Erythema Location: Circumferential Moisture No Abnormalities Noted: No Temperature / Pain Moist: Yes Temperature: No Abnormality Tenderness on Palpation: Yes Wound Preparation Ulcer Cleansing: Rinsed/Irrigated with Saline Topical Anesthetic Applied: Other: lidocaine 4%, Electronic Signature(s) Signed: 07/29/2016 3:49:08 PM By: Montey Hora Entered By: Montey Hora on 07/29/2016 08:48:24 [name] ([id number]) -------------------------------------------------------------------------------- Wound Assessment Details Patient Name: John Sours T. Date of  Service: 07/29/2016 8:45 AM Medical Record Number: [id number] Patient Account Number: [id number] Date of Birth/Sex: December 12, 1940 (75 y.o. Male) Treating RN: Montey Hora Primary Care Physician: [name] Other Clinician: Referring Physician: [name] Treating Physician/Extender: [name] Weeks in Treatment: 5 Wound Status Wound Number:  2 Primary Trauma, Other Etiology: Wound Location: Left Lower Leg - Posterior Wound Open Wounding Event: Trauma Status: Date Acquired: 06/12/2016 Comorbid Asthma, Hypertension, Hepatitis C, Weeks Of Treatment: 0 History: Type II Diabetes, Osteoarthritis Clustered Wound: No Photos Wound Measurements Length: (cm) 1.6 Width: (cm) 0.5 Depth: (cm) 0.1 Area: (cm) 0.628 Volume: (cm) 0.063 % Reduction in Area: 0% % Reduction in Volume: 0% Epithelialization: None Tunneling: No Undermining: No Wound Description Classification: Partial Thickness Foul Odor Diabetic Severity (Wagner): Grade 1 Wound Margin: Flat and Intact Exudate Amount: Medium Exudate Type: Serous Exudate Color: amber After Cleansing: No Wound Bed Granulation Amount: None Present (0%) Exposed Structure Necrotic Amount: Large (67-100%) Fascia Exposed: No Necrotic Quality: Eschar Fat Layer Exposed: No Tendon Exposed: No [name], [name] ([id number]) Muscle Exposed: No Joint Exposed: No Bone Exposed: No Limited to Skin Breakdown Periwound Skin Texture Texture Color No Abnormalities Noted: No No Abnormalities Noted: No Callus: No Atrophie Blanche: No Crepitus: No Cyanosis: No Excoriation: No Ecchymosis: No Fluctuance: No Erythema: No Friable: No Hemosiderin Staining: No Induration: No Mottled: No Localized Edema: No Pallor: No Rash: No Rubor: No Scarring: No Temperature / Pain Moisture Temperature: No Abnormality No Abnormalities Noted: No Dry / Scaly: No Maceration: No Moist: No Wound Preparation Ulcer Cleansing: Rinsed/Irrigated  with Saline Topical Anesthetic Applied: Other: lidocaine 4%, Electronic Signature(s) Signed: 07/29/2016 3:49:08 PM By: Montey Hora Entered By: Montey Hora on 07/29/2016 08:49:11 [name] ([id number]) -------------------------------------------------------------------------------- Vitals Details Patient Name: John Sours T. Date of Service: 07/29/2016 8:45 AM Medical Record Number: [id number] Patient Account Number: [id number] Date of Birth/Sex: 1941-04-04 (75 y.o. Male) Treating RN: Montey Hora Primary Care Physician: [name] Other Clinician: Referring Physician: [name] Treating Physician/Extender: [name] Weeks in Treatment: 5 Vital Signs Time Taken: 08:37 Temperature (F): 97.7 Height (in): 69 Pulse (bpm): 77 Weight (lbs): 182 Respiratory Rate (breaths/min): 18 Body Mass Index (BMI): 26.9 Blood Pressure (mmHg): 140/70 Reference Range: 80 - 120 mg / dl Electronic Signature(s) Signed: 07/29/2016 3:49:08 PM By: Montey Hora Entered By: Montey Hora on 07/29/2016 08:37:40
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
## 2016-08-05 ENCOUNTER — Encounter: Payer: PPO | Attending: Internal Medicine | Admitting: Internal Medicine

## 2016-08-05 DIAGNOSIS — X58XXXD Exposure to other specified factors, subsequent encounter: Secondary | ICD-10-CM | POA: Insufficient documentation

## 2016-08-05 DIAGNOSIS — I10 Essential (primary) hypertension: Secondary | ICD-10-CM | POA: Diagnosis not present

## 2016-08-05 DIAGNOSIS — S81812D Laceration without foreign body, left lower leg, subsequent encounter: Secondary | ICD-10-CM | POA: Insufficient documentation

## 2016-08-05 DIAGNOSIS — M84464D Pathological fracture, left fibula, subsequent encounter for fracture with routine healing: Secondary | ICD-10-CM | POA: Insufficient documentation

## 2016-08-05 DIAGNOSIS — E119 Type 2 diabetes mellitus without complications: Secondary | ICD-10-CM | POA: Insufficient documentation

## 2016-08-05 DIAGNOSIS — M1990 Unspecified osteoarthritis, unspecified site: Secondary | ICD-10-CM | POA: Diagnosis not present

## 2016-08-05 DIAGNOSIS — B1920 Unspecified viral hepatitis C without hepatic coma: Secondary | ICD-10-CM | POA: Insufficient documentation

## 2016-08-05 DIAGNOSIS — J45909 Unspecified asthma, uncomplicated: Secondary | ICD-10-CM | POA: Insufficient documentation
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
## 2016-08-06 NOTE — Progress Notes (Signed)
SHIRRELL, PRUETTE ([id number]) Visit Report for 08/05/2016 Arrival Information [name] Patient Name: [name], [name]. Date of Service: 08/05/2016 8:45 AM Medical Record Number: [id number] Patient Account Number: [id number] Date of Birth/Sex: 04/25/1941 (75 y.o. Male) Treating RN: Ahmed Prima Primary Care Physician: [name] Other Clinician: Referring Physician: [name] Treating Physician/Extender: [name] in Treatment: 6 Visit Information History Since Last Visit All ordered tests and consults were completed: No Patient Arrived: [name] Added or deleted any medications: No Arrival Time: 09:00 Any new allergies or adverse reactions: No Accompanied By: self Had a fall or experienced change in No Transfer Assistance: None activities of daily living that may affect Patient Identification Verified: Yes risk of falls: Secondary Verification Process Yes Signs or symptoms of abuse/neglect since last No Completed: visito Patient Requires Transmission-Based No Hospitalized since last visit: No Precautions: Pain Present Now: No Patient Has Alerts: Yes Patient Alerts: DM Type II Electronic Signature(s) Signed: 08/05/2016 4:41:03 PM By: Alric Quan Entered By: Alric Quan on 08/05/2016 09:00:53 [name] ([id number]) -------------------------------------------------------------------------------- Encounter Discharge Information [name] Patient Name: John Sours T. Date of Service: 08/05/2016 8:45 AM Medical Record Number: [id number] Patient Account Number: [id number] Date of Birth/Sex: Jan 08, 1941 (75 y.o. Male) Treating RN: Ahmed Prima Primary Care Physician: [name] Other Clinician: Referring Physician: [name] Treating Physician/Extender: [name] in Treatment: 6 Encounter Discharge Information Items Discharge Pain Level: 0 Discharge Condition: Stable Ambulatory Status: Cane Discharge  Destination: Home Transportation: Private Auto Accompanied By: self Schedule Follow-up Appointment: Yes Medication Reconciliation completed Yes and provided to Patient/Care Seth Friedlander: Provided on Clinical Summary of Care: 08/05/2016 Form Type Recipient Paper Patient RR Electronic Signature(s) Signed: 08/05/2016 9:22:16 AM By: Ruthine Dose Entered By: Ruthine Dose on 08/05/2016 09:22:16 [name] ([id number]) -------------------------------------------------------------------------------- Lower Extremity Assessment [name] Patient Name: John Sours T. Date of Service: 08/05/2016 8:45 AM Medical Record Number: [id number] Patient Account Number: [id number] Date of Birth/Sex: [birth year] (75 y.o. Male) Treating RN: Ahmed Prima Primary Care Physician: [name] Other Clinician: Referring Physician: [name] Treating Physician/Extender: [name] Weeks in Treatment: 6 Vascular Assessment Pulses: Posterior Tibial Dorsalis Pedis Palpable: [Left:Yes] Extremity colors, hair growth, and conditions: Extremity Color: [Left:Normal] Temperature of Extremity: [Left:Warm] Capillary Refill: [Left:< 3 seconds] Electronic Signature(s) Signed: 08/05/2016 4:41:03 PM By: Alric Quan Entered By: Alric Quan on 08/05/2016 09:03:53 [name], [name] ([id number]) -------------------------------------------------------------------------------- Multi Wound Chart [name] Patient Name: John Sours T. Date of Service: 08/05/2016 8:45 AM Medical Record Number: [id number] Patient Account Number: [id number] Date of Birth/Sex: 08-25-1941 (75 y.o. 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] [N/A:N/A] Secondary Etiology: [1:Trauma, Other] [2:Trauma, Other] [N/A:N/A] Comorbid History: [1:Asthma, Hypertension, Hepatitis C, Type II Diabetes, Osteoarthritis] [2:Asthma, Hypertension, Hepatitis C, Type II Diabetes, Osteoarthritis] [N/A:N/A] Date Acquired: [1:06/12/2016] [2:06/12/2016] [N/A:N/A] Weeks of Treatment: [1:6] [2:1] [N/A:N/A] Wound Status: [1:Open] [2:Open] [N/A:N/A] Measurements L x W x D 0x0x0 [2:0.1x0.1x0.1] [N/A:N/A] (cm) Area (cm) : [1:0] [2:0.008] [N/A:N/A] Volume (cm) : [1:0] [2:0.001] [N/A:N/A] % Reduction in Area: [1:100.00%] [2:98.70%] [N/A:N/A] % Reduction in Volume: 100.00% [2:98.40%] [  N/A:N/A] Classification: [1:Grade 0] [2:Grade 1] [N/A:N/A] Exudate Amount: [1:None Present] [2:None Present] [N/A:N/A] Wound Margin: [1:Flat and Intact] [2:Flat and Intact] [N/A:N/A] Granulation Amount: [1:None Present (0%)] [2:None Present (0%)] [N/A:N/A] Necrotic Amount: [1:None Present (0%)] [2:Large (67-100%)] [N/A:N/A] Necrotic Tissue: [1:N/A] [2:Eschar] [N/A:N/A] Exposed Structures: [1:Fascia: No Fat: No Tendon: No Muscle: No Joint: No Bone: No] [2:Fascia: No Fat: No Tendon: No Muscle: No Joint: No Bone: No] [N/A:N/A] Limited to Skin Limited to Skin Breakdown Breakdown Epithelialization: Large (67-100%) None N/A Periwound Skin Texture: Edema: Yes Edema: No N/A Excoriation: No Induration: No Callus: No Crepitus: No Fluctuance: No Friable: No Rash: No Scarring: No Periwound Skin Moist: Yes Maceration: No N/A Moisture: Moist: No Dry/Scaly: No Periwound Skin Color: Erythema: Yes Atrophie Blanche: No  N/A Cyanosis: No Ecchymosis: No Erythema: No Hemosiderin Staining: No Mottled: No Pallor: No Rubor: No Erythema Location: Circumferential N/A N/A Temperature: No Abnormality No Abnormality N/A Tenderness on Yes No N/A Palpation: Wound Preparation: Ulcer Cleansing: Ulcer Cleansing: N/A Rinsed/Irrigated with Rinsed/Irrigated with Saline Saline Topical Anesthetic Topical Anesthetic Applied: None Applied: Other: lidocaine 4% Treatment Notes Electronic Signature(s) Signed: 08/05/2016 4:41:03 PM By: Alric Quan Entered By: Alric Quan on 08/05/2016 09:12:05 [name] ([id number]) -------------------------------------------------------------------------------- [name] Patient Name: John Sours T. Date of Service: 08/05/2016 8:45 AM Medical Record Number: [id number] Patient Account Number: [id number] Date of Birth/Sex: 07-29-1941 (75 y.o. Male) Treating RN: Ahmed Prima Primary Care Physician: [name] Other Clinician: Referring Physician: [name] Treating Physician/Extender: [name] in Treatment: 6 Active Inactive Orientation to the Wound Care Program Nursing Diagnoses: Knowledge deficit related to the wound healing center program Goals: Patient/caregiver will verbalize understanding of the [facility] [facility] Date Initiated: 06/24/2016 Goal Status: Active Interventions: Provide education on orientation to the wound center Notes: Pain, Acute or Chronic Nursing Diagnoses: Pain, acute or chronic: actual or potential Goals: Patient will verbalize adequate pain control and receive pain control interventions during procedures as needed Date Initiated: 06/24/2016 Goal Status: Active Patient/caregiver will verbalize adequate pain control between visits Date Initiated: 06/24/2016 Goal Status: Active Interventions: Assess comfort goal upon admission Complete pain assessment as per visit  requirements Notes: Soft Tissue Infection Nursing Diagnoses: JVAUGHN, VANTASSELL ([id number]) Impaired tissue integrity Goals: Patient/caregiver will verbalize understanding of or measures to prevent infection and contamination in the home setting Date Initiated: 06/24/2016 Goal Status: Active Patient's soft tissue infection will resolve Date Initiated: 06/24/2016 Goal Status: Active Interventions: Assess signs and symptoms of infection every visit Notes: Wound/Skin Impairment Nursing Diagnoses: Impaired tissue integrity Goals: Ulcer/skin breakdown will have a volume reduction of 30% by week 4 Date Initiated: 06/24/2016 Goal Status: Active Ulcer/skin breakdown will have a volume reduction of 50% by week 8 Date Initiated: 06/24/2016 Goal Status: Active Ulcer/skin breakdown will have a volume reduction of 80% by week 12 Date Initiated: 06/24/2016 Goal Status: Active Interventions: Assess patient/caregiver ability to perform ulcer/skin care regimen upon admission and as needed Assess ulceration(s) every visit Notes: Electronic Signature(s) Signed: 08/05/2016 4:41:03 PM By: Alric Quan Entered By: Alric Quan on 08/05/2016 09:11:59 [name], [name] ([id number]) -------------------------------------------------------------------------------- Pain Assessment [name] Patient Name: John Sours T. Date of Service: 08/05/2016 8:45 AM Medical Record Number: [id number] Patient Account Number: [id number] Date of Birth/Sex: [birth year] (75 y.o. Male) Treating RN: Ahmed Prima Primary Care Physician: [name] Other Clinician: Referring Physician: [name] Treating Physician/Extender: [name] Weeks in Treatment: 6 Active Problems Location of Pain Severity and Description of Pain Patient Has Paino No Site  Locations With Dressing Change: No Pain Management and Medication Current Pain Management: Electronic Signature(s) Signed: 08/05/2016 4:41:03 PM By:  Alric Quan Entered By: Alric Quan on 08/05/2016 09:01:01 [name] ([id number]) -------------------------------------------------------------------------------- Patient/Caregiver Education [name] Patient Name: John Sours T. Date of Service: 08/05/2016 8:45 AM Medical Record Number: [id number] Patient Account Number: [id number] Date of Birth/Gender: May 10, 1941 (75 y.o. Male) Treating RN: Ahmed Prima Primary Care Physician: [name] Other Clinician: Referring Physician: [name] Treating Physician/Extender: [name] in Treatment: 6 Education Assessment Education Provided To: Patient Education Topics Provided Wound/Skin Impairment: Handouts: Other: change dressing as ordered Methods: Demonstration, Explain/Verbal Responses: State content correctly Electronic Signature(s) Signed: 08/05/2016 4:41:03 PM By: Alric Quan Entered By: Alric Quan on 08/05/2016 09:15:09 [name] ([id number]) -------------------------------------------------------------------------------- Wound Assessment [name] Patient Name: John Sours T. Date of Service: 08/05/2016 8:45 AM Medical Record Number: [id number] Patient Account Number: [id number] Date of Birth/Sex: [birth year] (75 y.o. Male) Treating RN: Carolyne Fiscal, Debi Primary Care Physician: [name] Other Clinician: Referring Physician: [name] Treating Physician/Extender: [name] Weeks in Treatment: 6 Wound Status Wound Number: 1 Primary Diabetic Wound/Ulcer of the Lower Etiology: Extremity Wound Location: Left Lower Leg - Medial Secondary Trauma, Other Wounding Event: Trauma Etiology: Date Acquired: 06/12/2016 Wound Status: Open Weeks Of Treatment: 6 Comorbid Asthma, Hypertension, Hepatitis C, Clustered Wound: No History: Type II Diabetes, Osteoarthritis Photos Photo Uploaded By: Alric Quan on 08/05/2016 10:25:25 Wound Measurements Length:  (cm) 0 % Reduction i Width: (cm) 0 % Reduction i Depth: (cm) 0 Epithelializa Area: (cm) 0 Tunneling: Volume: (cm) 0 Undermining: n Area: 100% n Volume: 100% tion: Large (67-100%) No No Wound Description Classification: Grade 0 Wound Margin: Flat and Intact Exudate Amount: None Present Foul Odor After Cleansing: No Wound Bed Granulation Amount: None Present (0%) Exposed Structure Necrotic Amount: None Present (0%) Fascia Exposed: No Fat Layer Exposed: No Tendon Exposed: No Muscle Exposed: No Joint Exposed: No [place], Isaak T. ([id number]) Bone Exposed: No Limited to Skin Breakdown Periwound Skin Texture Texture Color No Abnormalities Noted: No No Abnormalities Noted: No Localized Edema: Yes Erythema: Yes Erythema Location: Circumferential Moisture No Abnormalities Noted: No Temperature / Pain Moist: Yes Temperature: No Abnormality Tenderness on Palpation: Yes Wound Preparation Ulcer Cleansing: Rinsed/Irrigated with Saline Topical Anesthetic Applied: None Electronic Signature(s) Signed: 08/05/2016 4:41:03 PM By: Alric Quan Entered By: Alric Quan on 08/05/2016 09:11:16 [name] ([id number]) -------------------------------------------------------------------------------- Wound Assessment [name] Patient Name: John Sours T. Date of Service: 08/05/2016 8:45 AM Medical Record Number: [id number] Patient Account Number: [id number] Date of Birth/Sex: 1940-12-10 (75 y.o. Male) Treating RN: Carolyne Fiscal, Debi Primary Care Physician: [name] Other Clinician: Referring Physician: [name] Treating Physician/Extender: [name] Weeks in Treatment: 6 Wound Status Wound Number: 2 Primary Diabetic Wound/Ulcer of the Lower Etiology: Extremity Wound Location: Left Lower Leg - Posterior Secondary Trauma, Other Wounding Event: Trauma Etiology: Date Acquired: 06/12/2016 Wound Status: Open Weeks Of Treatment: 1 Comorbid Asthma,  Hypertension, Hepatitis C, Clustered Wound: No History: Type II Diabetes, Osteoarthritis Photos Photo Uploaded By: Alric Quan on 08/05/2016 10:25:27 Wound Measurements Length: (cm) 0.1 Width: (cm) 0.1 Depth: (cm) 0.1 Area: (cm) 0.008 Volume: (cm) 0.001 % Reduction in Area: 98.7% % Reduction in Volume: 98.4% Epithelialization: None Tunneling: No Undermining: No Wound Description Classification: Grade 1 Wound Margin: Flat and Intact Exudate Amount: None Present Foul Odor After Cleansing: No Wound Bed Granulation Amount: None Present (0%) Exposed Structure Necrotic Amount: Large (67-100%) Fascia Exposed: No Necrotic Quality: Eschar Fat Layer Exposed: No  Tendon Exposed: No Muscle Exposed: No Joint Exposed: No Burgio, Grainger T. ([id number]) Bone Exposed: No Limited to Skin Breakdown Periwound Skin Texture Texture Color No Abnormalities Noted: No No Abnormalities Noted: No Callus: No Atrophie Blanche: No Crepitus: No Cyanosis: No Excoriation: No Ecchymosis: No Fluctuance: No Erythema: No Friable: No Hemosiderin Staining: No Induration: No Mottled: No Localized Edema: No Pallor: No Rash: No Rubor: No Scarring: No Temperature / Pain Moisture Temperature: No Abnormality No Abnormalities Noted: No Dry / Scaly: No Maceration: No Moist: No Wound Preparation Ulcer Cleansing: Rinsed/Irrigated with Saline Topical Anesthetic Applied: Other: lidocaine 4%, Treatment Notes Wound #2 (Left, Posterior Lower Leg) 1. Cleansed with: Clean wound with Normal Saline 2. Anesthetic Topical Lidocaine 4% cream to wound bed prior to debridement 4. Dressing Applied: Prisma Ag 5. Secondary Dressing Applied ABD Pad Dry Gauze Kerlix/Conform 7. Secured with Recruitment consultant) Signed: 08/05/2016 4:41:03 PM By: Alric Quan Entered By: Alric Quan on 08/05/2016 09:11:53 [name]  ([id number]) -------------------------------------------------------------------------------- Vitals [name] Patient Name: John Sours T. Date of Service: 08/05/2016 8:45 AM Medical Record Number: [id number] Patient Account Number: [id number] Date of Birth/Sex: Mar 17, 1941 (75 y.o. Male) Treating RN: Carolyne Fiscal, Debi Primary Care Physician: [name] Other Clinician: Referring Physician: [name] Treating Physician/Extender: [name] Weeks in Treatment: 6 Vital Signs Time Taken: 09:02 Temperature (F): 98.3 Height (in): 69 Pulse (bpm): 72 Weight (lbs): 182 Respiratory Rate (breaths/min): 18 Body Mass Index (BMI): 26.9 Blood Pressure (mmHg): 141/67 Reference Range: 80 - 120 mg / dl Electronic Signature(s) Signed: 08/05/2016 4:41:03 PM By: Alric Quan Entered By: Alric Quan on 08/05/2016 [id number]

## 2016-08-12 ENCOUNTER — Encounter: Payer: PPO | Admitting: Internal Medicine

## 2016-08-12 DIAGNOSIS — S81812D Laceration without foreign body, left lower leg, subsequent encounter: Secondary | ICD-10-CM | POA: Diagnosis not present

## 2016-08-12 NOTE — Progress Notes (Signed)
PIUS, BULSON ([id number]) Visit Report for 08/12/2016 Arrival Information Details Patient Name: [name], [name]. Date of Service: 08/12/2016 10:45 AM Medical Record Number: [id number] Patient Account Number: [id number] Date of Birth/Sex: 04-Oct-1941 (75 y.o. Male) Treating RN: Montey Hora Primary Care Physician: [name] Other Clinician: Referring Physician: [name] Treating Physician/Extender: [name] in Treatment: 7 Visit Information History Since Last Visit Added or deleted any medications: No Patient Arrived: Cane Any new allergies or adverse reactions: No Arrival Time: 10:30 Had a fall or experienced change in No Accompanied By: self activities of daily living that may affect Transfer Assistance: None risk of falls: Patient Identification Verified: Yes Signs or symptoms of abuse/neglect since last No Secondary Verification Process Yes visito Completed: Hospitalized since last visit: No Patient Requires Transmission-Based No Pain Present Now: No Precautions: Patient Has Alerts: Yes Patient Alerts: DM Type II Electronic Signature(s) Signed: 08/12/2016 5:07:29 PM By: Montey Hora Entered By: Montey Hora on 08/12/2016 10:31:15 Murlean Caller ([id number]) -------------------------------------------------------------------------------- Clinic Level of Care Assessment Details Patient Name: John Sours T. Date of Service: 08/12/2016 10:45 AM Medical Record Number: [id number] Patient Account Number: [id number] Date of Birth/Sex: 18-Sep-1941 (75 y.o. Male) Treating RN: Montey Hora Primary Care Physician: [name] Other Clinician: Referring Physician: [name] Treating Physician/Extender: [name] in Treatment: 7 Clinic Level of Care Assessment Items TOOL 4 Quantity Score []  - Use when only an EandM is performed on FOLLOW-UP visit 0 ASSESSMENTS - Nursing Assessment / Reassessment X - Reassessment  of Co-morbidities (includes updates in patient status) 1 10 X - Reassessment of Adherence to Treatment Plan 1 5 ASSESSMENTS - Wound and Skin Assessment / Reassessment X - Simple Wound Assessment / Reassessment - one wound 1 5 []  - Complex Wound Assessment / Reassessment - multiple wounds 0 []  - Dermatologic / Skin Assessment (not related to wound area) 0 ASSESSMENTS - Focused Assessment []  - Circumferential Edema Measurements - multi extremities 0 []  - Nutritional Assessment / Counseling / Intervention 0 X - Lower Extremity Assessment (monofilament, tuning fork, pulses) 1 5 []  - Peripheral Arterial Disease Assessment (using hand held doppler) 0 ASSESSMENTS - Ostomy and/or Continence Assessment and Care []  - Incontinence Assessment and Management 0 []  - Ostomy Care Assessment and Management (repouching, etc.) 0 PROCESS - Coordination of Care X - Simple Patient / Family Education for ongoing care 1 15 []  - Complex (extensive) Patient / Family Education for ongoing care 0 []  - Staff obtains Programmer, systems, Records, Test Results / Process Orders 0 []  - Staff telephones HHA, Nursing Homes / Clarify orders / etc 0 []  - Routine Transfer to another Facility (non-emergent condition) 0 Biggs, Taft T. ([id number]) []  - Routine Hospital Admission (non-emergent condition) 0 []  - New Admissions / Biomedical engineer / Ordering NPWT, Apligraf, etc. 0 []  - Emergency Hospital Admission (emergent condition) 0 X - Simple Discharge Coordination 1 10 []  - Complex (extensive) Discharge Coordination 0 PROCESS - Special Needs []  - Pediatric / Minor Patient Management 0 []  - Isolation Patient Management 0 []  - Hearing / Language / Visual special needs 0 []  - Assessment of Community assistance (transportation, D/C planning, etc.) 0 []  - Additional assistance / Altered mentation 0 []  - Support Surface(s) Assessment (bed, cushion, seat, etc.) 0 INTERVENTIONS - Wound Cleansing / Measurement X - Simple Wound  Cleansing - one wound 1 5 []  - Complex Wound Cleansing - multiple wounds 0 X - Wound Imaging (photographs - any number of wounds) 1 5 []  -  Wound Tracing (instead of photographs) 0 X - Simple Wound Measurement - one wound 1 5 []  - Complex Wound Measurement - multiple wounds 0 INTERVENTIONS - Wound Dressings []  - Small Wound Dressing one or multiple wounds 0 []  - Medium Wound Dressing one or multiple wounds 0 []  - Large Wound Dressing one or multiple wounds 0 []  - Application of Medications - topical 0 []  - Application of Medications - injection 0 INTERVENTIONS - Miscellaneous []  - External ear exam 0 [place], Vane T. ([id number]) []  - Specimen Collection (cultures, biopsies, blood, body fluids, etc.) 0 []  - Specimen(s) / Culture(s) sent or taken to Lab for analysis 0 []  - Patient Transfer (multiple staff / Harrel Lemon Lift / Similar devices) 0 []  - Simple Staple / Suture removal (25 or less) 0 []  - Complex Staple / Suture removal (26 or more) 0 []  - Hypo / Hyperglycemic Management (close monitor of Blood Glucose) 0 []  - Ankle / Brachial Index (ABI) - do not check if billed separately 0 X - Vital Signs 1 5 Has the patient been seen at the hospital within the last three years: Yes Total Score: 70 Level Of Care: New/Established - Level 2 Electronic Signature(s) Signed: 08/12/2016 5:07:29 PM By: Montey Hora Entered By: Montey Hora on 08/12/2016 10:45:09 Murlean Caller ([id number]) -------------------------------------------------------------------------------- Encounter Discharge Information Details Patient Name: John Sours T. Date of Service: 08/12/2016 10:45 AM Medical Record Number: [id number] Patient Account Number: [id number] Date of Birth/Sex: Apr 28, 1941 (75 y.o. Male) Treating RN: Cornell Barman Primary Care Physician: [name] Other Clinician: Referring Physician: [name] Treating Physician/Extender: [name] in Treatment: 7 Encounter  Discharge Information Items Discharge Pain Level: 0 Discharge Condition: Stable Ambulatory Status: Cane Discharge Destination: Home Transportation: Private Auto Accompanied By: self Schedule Follow-up Appointment: Yes Medication Reconciliation completed No and provided to Patient/Care [name]: Provided on Clinical Summary of Care: 08/12/2016 Form Type Recipient Paper Patient RR Electronic Signature(s) Signed: 08/12/2016 5:07:29 PM By: Montey Hora Previous Signature: 08/12/2016 10:45:03 AM Version By: Ruthine Dose Entered By: Montey Hora on 08/12/2016 10:45:29 Murlean Caller ([id number]) -------------------------------------------------------------------------------- Lower Extremity Assessment Details Patient Name: John Sours T. Date of Service: 08/12/2016 10:45 AM Medical Record Number: [id number] Patient Account Number: [id number] Date of Birth/Sex: 1941/06/08 (75 y.o. Male) Treating RN: Montey Hora Primary Care Physician: [name] Other Clinician: Referring Physician: [name] Treating Physician/Extender: [name] Weeks in Treatment: 7 Vascular Assessment Pulses: Posterior Tibial Dorsalis Pedis Palpable: [Left:Yes] Extremity colors, hair growth, and conditions: Extremity Color: [Left:Normal] Hair Growth on Extremity: [Left:No] Temperature of Extremity: [Left:Warm] Capillary Refill: [Left:< 3 seconds] Dependent Rubor: [Left:No] Blanched when Elevated: [Left:No] Lipodermatosclerosis: [Left:No] Toe Nail Assessment Left: Right: Thick: Yes Discolored: Yes Deformed: Yes Improper Length and Hygiene: Yes Electronic Signature(s) Signed: 08/12/2016 5:07:29 PM By: Montey Hora Entered By: Montey Hora on 08/12/2016 10:35:14 Murlean Caller ([id number]) -------------------------------------------------------------------------------- Multi Wound Chart Details Patient Name: John Sours T. Date of Service: 08/12/2016 10:45  AM Medical Record Number: [id number] Patient Account Number: [id number] Date of Birth/Sex: 05-31-1941 (75 y.o. Male) Treating RN: Montey Hora Primary Care Physician: [name] Other Clinician: Referring Physician: [name] Treating Physician/Extender: [name] Weeks in Treatment: 7 Vital Signs Height(in): 69 Pulse(bpm): 72 Weight(lbs): 182 Blood Pressure 149/74 (mmHg): Body Mass Index(BMI): 27 Temperature(F): 98.0 Respiratory Rate 18 (breaths/min): Photos: [N/A:N/A] Wound Location: Left, Posterior Lower Leg N/A N/A Wounding Event: Trauma N/A N/A Primary Etiology: Diabetic Wound/Ulcer of N/A N/A the Lower Extremity Secondary Etiology: Trauma, Other N/A N/A Comorbid  History: Asthma, Hypertension, N/A N/A Hepatitis C, Type II Diabetes, Osteoarthritis Date Acquired: 06/12/2016 N/A N/A Weeks of Treatment: 2 N/A N/A Wound Status: Healed - Epithelialized N/A N/A Measurements L x W x D 0x0x0 N/A N/A (cm) Area (cm) : 0 N/A N/A Volume (cm) : 0 N/A N/A % Reduction in Area: 100.00% N/A N/A % Reduction in Volume: 100.00% N/A N/A Classification: Grade 1 N/A N/A Exudate Amount: None Present N/A N/A Wound Margin: Flat and Intact N/A N/A Granulation Amount: None Present (0%) N/A N/A Necrotic Amount: None Present (0%) N/A N/A Exposed Structures: N/A N/A AN, REEDS ([id number]) Fascia: No Fat: No Tendon: No Muscle: No Joint: No Bone: No Limited to Skin Breakdown Epithelialization: None N/A N/A Periwound Skin Texture: Edema: No N/A N/A Excoriation: No Induration: No Callus: No Crepitus: No Fluctuance: No Friable: No Rash: No Scarring: No Periwound Skin Maceration: No N/A N/A Moisture: Moist: No Dry/Scaly: No Periwound Skin Color: Atrophie Blanche: No N/A N/A Cyanosis: No Ecchymosis: No Erythema: No Hemosiderin Staining: No Mottled: No Pallor: No Rubor: No Temperature: No Abnormality N/A N/A Tenderness on No N/A  N/A Palpation: Wound Preparation: Ulcer Cleansing: N/A N/A Rinsed/Irrigated with Saline Topical Anesthetic Applied: None Treatment Notes Electronic Signature(s) Signed: 08/12/2016 5:05:22 PM By: Gretta Cool, RN, BSN, Kim RN, BSN Entered By: Gretta Cool, RN, BSN, Kim on 08/12/2016 15:45:28 Murlean Caller ([id number]) -------------------------------------------------------------------------------- [place] Details Patient Name: John Sours T. Date of Service: 08/12/2016 10:45 AM Medical Record Number: [id number] Patient Account Number: [id number] Date of Birth/Sex: Apr 29, 1941 (74 y.o. Male) Treating RN: Montey Hora Primary Care Physician: [name] Other Clinician: Referring Physician: [name] Treating Physician/Extender: [name] Weeks in Treatment: 7 Active Inactive Electronic Signature(s) Signed: 08/12/2016 5:07:29 PM By: Montey Hora Entered By: Montey Hora on 08/12/2016 10:43:53 Murlean Caller ([id number]) -------------------------------------------------------------------------------- Pain Assessment Details Patient Name: John Sours T. Date of Service: 08/12/2016 10:45 AM Medical Record Number: [id number] Patient Account Number: [id number] Date of Birth/Sex: 04/27/1941 (75 y.o. Male) Treating RN: Montey Hora Primary Care Physician: [name] Other Clinician: Referring Physician: [name] Treating Physician/Extender: [name] Weeks in Treatment: 7 Active Problems Location of Pain Severity and Description of Pain Patient Has Paino No Site Locations Pain Management and Medication Current Pain Management: Notes Topical or injectable lidocaine is offered to patient for acute pain when surgical debridement is performed. If needed, Patient is instructed to use over the counter pain medication for the following 24-48 hours after debridement. Wound care MDs do not prescribed pain medications. Patient  has chronic pain or uncontrolled pain. Patient has been instructed to make an appointment with their Primary Care Physician for pain management. Electronic Signature(s) Signed: 08/12/2016 5:07:29 PM By: Montey Hora Entered By: Montey Hora on 08/12/2016 10:31:23 Murlean Caller ([id number]) -------------------------------------------------------------------------------- Patient/Caregiver Education Details Patient Name: John Sours T. Date of Service: 08/12/2016 10:45 AM Medical Record Number: [id number] Patient Account Number: [id number] Date of Birth/Gender: 1941-11-13 (76 y.o. Male) Treating RN: Montey Hora Primary Care Physician: [name] Other Clinician: Referring Physician: [name] Treating Physician/Extender: [name] in Treatment: 7 Education Assessment Education Provided To: Patient Education Topics Provided Basic Hygiene: Handouts: Other: skin care Methods: Explain/Verbal Responses: State content correctly Electronic Signature(s) Signed: 08/12/2016 5:07:29 PM By: Montey Hora Entered By: Montey Hora on 08/12/2016 10:45:41 Murlean Caller ([id number]) -------------------------------------------------------------------------------- Wound Assessment Details Patient Name: John Sours T. Date of Service: 08/12/2016 10:45 AM Medical Record Number: [id number] Patient Account Number: [id number] Date of Birth/Sex: 1941-06-27 (75  y.o. Male) Treating RN: Montey Hora Primary Care Physician: [name] Other Clinician: Referring Physician: [name] Treating Physician/Extender: [name] Weeks in Treatment: 7 Wound Status Wound Number: 2 Primary Diabetic Wound/Ulcer of the Lower Etiology: Extremity Wound Location: Left, Posterior Lower Leg Secondary Trauma, Other Wounding Event: Trauma Etiology: Date Acquired: 06/12/2016 Wound Status: Healed - Epithelialized Weeks Of Treatment: 2 Comorbid Asthma,  Hypertension, Hepatitis C, Clustered Wound: No History: Type II Diabetes, Osteoarthritis Photos Wound Measurements Length: (cm) 0 % Reduction Width: (cm) 0 % Reduction Depth: (cm) 0 Epitheliali Area: (cm) 0 Tunneling: Volume: (cm) 0 Underminin in Area: 100% in Volume: 100% zation: None No g: No Wound Description Classification: Grade 1 Wound Margin: Flat and Intact Exudate Amount: None Present Foul Odor After Cleansing: No Wound Bed Granulation Amount: None Present (0%) Exposed Structure Necrotic Amount: None Present (0%) Fascia Exposed: No Fat Layer Exposed: No Tendon Exposed: No Muscle Exposed: No Joint Exposed: No Bone Exposed: No Finnan, Martez T. ([id number]) Limited to Skin Breakdown Periwound Skin Texture Texture Color No Abnormalities Noted: No No Abnormalities Noted: No Callus: No Atrophie Blanche: No Crepitus: No Cyanosis: No Excoriation: No Ecchymosis: No Fluctuance: No Erythema: No Friable: No Hemosiderin Staining: No Induration: No Mottled: No Localized Edema: No Pallor: No Rash: No Rubor: No Scarring: No Temperature / Pain Moisture Temperature: No Abnormality No Abnormalities Noted: No Dry / Scaly: No Maceration: No Moist: No Wound Preparation Ulcer Cleansing: Rinsed/Irrigated with Saline Topical Anesthetic Applied: None Electronic Signature(s) Signed: 08/12/2016 5:07:29 PM By: Montey Hora Entered By: Montey Hora on 08/12/2016 10:42:52 Murlean Caller ([id number]) -------------------------------------------------------------------------------- Vitals Details Patient Name: John Sours T. Date of Service: 08/12/2016 10:45 AM Medical Record Number: [id number] Patient Account Number: [id number] Date of Birth/Sex: 1941/05/19 (75 y.o. Male) Treating RN: Montey Hora Primary Care Physician: [name] Other Clinician: Referring Physician: [name] Treating Physician/Extender: [name] in  Treatment: 7 Vital Signs Time Taken: 10:34 Temperature (F): 98.0 Height (in): 69 Pulse (bpm): 72 Weight (lbs): 182 Respiratory Rate (breaths/min): 18 Body Mass Index (BMI): 26.9 Blood Pressure (mmHg): 149/74 Reference Range: 80 - 120 mg / dl Electronic Signature(s) Signed: 08/12/2016 5:07:29 PM By: Montey Hora Entered By: Montey Hora on 08/12/2016 10:34:26
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
## 2016-09-05 ENCOUNTER — Telehealth: Payer: Self-pay | Admitting: Family Medicine

## 2016-09-05 NOTE — Telephone Encounter (Signed)
lvm to schedule awv and cpe -knb

## 2016-09-08 NOTE — Telephone Encounter (Signed)
Called Pt to schedule AWV with [facility] and CPE with PCP for 10/  ° °

## 2016-11-05 ENCOUNTER — Telehealth: Payer: Self-pay | Admitting: Family Medicine
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
## 2016-11-06 NOTE — Telephone Encounter (Signed)
Added an addendum to the office note of 06-13-16 to correct the documentation error. Additional letter written to be sent to the insurance company.

## 2016-11-14 ENCOUNTER — Encounter: Payer: Self-pay | Admitting: Family Medicine

## 2016-11-14 ENCOUNTER — Ambulatory Visit (INDEPENDENT_AMBULATORY_CARE_PROVIDER_SITE_OTHER): Payer: PPO | Admitting: Family Medicine

## 2016-11-14 ENCOUNTER — Other Ambulatory Visit: Payer: Self-pay

## 2016-11-14 VITALS — BP 131/70 | HR 72 | Temp 99.1°F | Ht 72.0 in | Wt 182.5 lb

## 2016-11-14 VITALS — BP 131/70 | HR 72 | Temp 99.1°F | Wt 182.8 lb

## 2016-11-14 DIAGNOSIS — R011 Cardiac murmur, unspecified: Secondary | ICD-10-CM

## 2016-11-14 DIAGNOSIS — I10 Essential (primary) hypertension: Secondary | ICD-10-CM

## 2016-11-14 DIAGNOSIS — Z0000 Encounter for general adult medical examination without abnormal findings: Secondary | ICD-10-CM

## 2016-11-14 DIAGNOSIS — E1100 Type 2 diabetes mellitus with hyperosmolarity without nonketotic hyperglycemic-hyperosmolar coma (NKHHC): Secondary | ICD-10-CM | POA: Diagnosis not present

## 2016-11-14 DIAGNOSIS — Z23 Encounter for immunization: Secondary | ICD-10-CM

## 2016-11-14 DIAGNOSIS — R609 Edema, unspecified: Secondary | ICD-10-CM

## 2016-11-14 NOTE — Patient Instructions (Signed)
[name] , Thank you for taking time to come for your Medicare Wellness Visit. I appreciate your ongoing commitment to your health goals. Please review the following plan we discussed and let me know if I can assist you in the future.   These are the goals we discussed: Goals    . Increase water intake          Starting 11/14/16, I will continue to drink 6-8 glasses of water a day.       This is a list of the screening recommended for you and due dates:  Health Maintenance  Topic Date Due  . Complete foot exam   08/06/1949  . Shingles Vaccine  08/07/1999  . Pneumonia vaccines (1 of 2 - PCV13) 08/06/2004  . Flu Shot  07/01/2016  . Hemoglobin A1C  08/27/2016  . Eye exam for diabetics  12/03/2016  . Tetanus Vaccine  06/13/2026   Preventive Care for Adults  A healthy lifestyle and preventive care can promote health and wellness. Preventive health guidelines for adults include the following key practices.  . A routine yearly physical is a good way to check with your health care provider about your health and preventive screening. It is a chance to share any concerns and updates on your health and to receive a thorough exam.  . Visit your dentist for a routine exam and preventive care every 6 months. Brush your teeth twice a day and floss once a day. Good oral hygiene prevents tooth decay and gum disease.  . The frequency of eye exams is based on your age, health, family medical history, use  of contact lenses, and other factors. Follow your health care provider's ecommendations for frequency of eye exams.  . Eat a healthy diet. Foods like vegetables, fruits, whole grains, low-fat dairy products, and lean protein foods contain the nutrients you need without too many calories. Decrease your intake of foods high in solid fats, added sugars, and salt. Eat the right amount of calories for you. Get information about a proper diet from your health care provider, if necessary.  . Regular  physical exercise is one of the most important things you can do for your health. Most adults should get at least 150 minutes of moderate-intensity exercise (any activity that increases your heart rate and causes you to sweat) each week. In addition, most adults need muscle-strengthening exercises on 2 or more days a week.  Silver Sneakers may be a benefit available to you. To determine eligibility, you may visit the website: www.silversneakers.com or contact program at 3080403903 Mon-Fri between 8AM-8PM.   . Maintain a healthy weight. The body mass index (BMI) is a screening tool to identify possible weight problems. It provides an estimate of body fat based on height and weight. Your health care provider can find your BMI and can help you achieve or maintain a healthy weight.   For adults 20 years and older: ? A BMI below 18.5 is considered underweight. ? A BMI of 18.5 to 24.9 is normal. ? A BMI of 25 to 29.9 is considered overweight. ? A BMI of 30 and above is considered obese.   . Maintain normal blood lipids and cholesterol levels by exercising and minimizing your intake of saturated fat. Eat a balanced diet with plenty of fruit and vegetables. Blood tests for lipids and cholesterol should begin at age 36 and be repeated every 5 years. If your lipid or cholesterol levels are high, you are over 50, or you  are at high risk for heart disease, you may need your cholesterol levels checked more frequently. Ongoing high lipid and cholesterol levels should be treated with medicines if diet and exercise are not working.  . If you smoke, find out from your health care provider how to quit. If you do not use tobacco, please do not start.  . If you choose to drink alcohol, please do not consume more than 2 drinks per day. One drink is considered to be 12 ounces (355 mL) of beer, 5 ounces (148 mL) of wine, or 1.5 ounces (44 mL) of liquor.  . If you are 66-85 years old, ask your health care provider if  you should take aspirin to prevent strokes.  . Use sunscreen. Apply sunscreen liberally and repeatedly throughout the day. You should seek shade when your shadow is shorter than you. Protect yourself by wearing long sleeves, pants, a wide-brimmed hat, and sunglasses year round, whenever you are outdoors.  . Once a month, do a whole body skin exam, using a mirror to look at the skin on your back. Tell your health care provider of new moles, moles that have irregular borders, moles that are larger than a pencil eraser, or moles that have changed in shape or color.

## 2016-11-14 NOTE — Progress Notes (Signed)
Subjective:   [name] is a 75 y.o. male who presents for Medicare Annual/Subsequent preventive examination.  Review of Systems:  N/A  Cardiac Risk Factors include: advanced age (>25men, >40 women);diabetes mellitus;hypertension;male gender     Objective:    Vitals: BP 131/70 (BP Location: Right Arm)   Pulse 72   Temp 99.1 F (37.3 C) (Oral)   Ht 6' (1.829 m)   Wt 182 lb 8 oz (82.8 kg)   BMI 24.75 kg/m   Body mass index is 24.75 kg/m.  Tobacco History  Smoking Status  . Former Smoker  . Types: Cigarettes  Smokeless Tobacco  . Former Systems developer  . Types: Chew    Comment: 75 years old for 1 year     Counseling given: Not Answered   History reviewed. No pertinent past medical history. Past Surgical History:  Procedure Laterality Date  . CHOLECYSTECTOMY  04/1995  . LIPOMA EXCISION  01/28/2011   History reviewed. No pertinent family history. History  Sexual Activity  . Sexual activity: Not on file    Outpatient Encounter Prescriptions as of 11/14/2016  Medication Sig  . amLODipine (NORVASC) 10 MG tablet TAKE 1 TABLET BY MOUTH ONCE DAILY  . aspirin 81 MG tablet Take by mouth.  . Cinnamon 500 MG capsule Take by mouth.  Marland Kitchen glipiZIDE (GLUCOTROL XL) 2.5 MG 24 hr tablet TAKE 1 TABLET BY MOUTH EVERY DAY  . glucose blood test strip Test fasting blood sugar daily.  Marland Kitchen losartan (COZAAR) 50 MG tablet TAKE 1 TABLET BY MOUTH ONCE DAILY  . doxycycline (VIBRA-TABS) 100 MG tablet Take 1 tablet (100 mg total) by mouth 2 (two) times daily. (Patient not taking: Reported on 11/14/2016)  . traMADol (ULTRAM) 50 MG tablet Take 1 tablet (50 mg total) by mouth every 8 (eight) hours as needed. (Patient not taking: Reported on 11/14/2016)   No facility-administered encounter medications on file as of 11/14/2016.     Activities of Daily Living In your present state of health, do you have any difficulty performing the following activities: 11/14/2016  Hearing? N  Vision? N  Difficulty  concentrating or making decisions? N  Walking or climbing stairs? N  Dressing or bathing? N  Doing errands, shopping? N  Preparing Food and eating ? N  Using the Toilet? N  In the past six months, have you accidently leaked urine? Y  Do you have problems with loss of bowel control? N  Managing your Medications? N  Managing your Finances? N  Housekeeping or managing your Housekeeping? N  Some recent data might be hidden    Patient Care Team: Margo Common, PA as PCP - General (Physician Assistant)   Assessment:     Exercise Activities and Dietary recommendations Current Exercise Habits: The patient does not participate in regular exercise at present  Goals    . Increase water intake          Starting 11/14/16, I will continue to drink 6-8 glasses of water a day.      Fall Risk Fall Risk  11/14/2016  Falls in the past year? No   Depression Screen PHQ 2/9 Scores 11/14/2016  PHQ - 2 Score 0    Cognitive Function     6CIT Screen 11/14/2016  What Year? 0 points  What month? 0 points  What time? 0 points  Count back from 20 0 points  Months in reverse 0 points  Repeat phrase 4 points  Total Score 4    Immunization  History  Administered Date(s) Administered  . Influenza, Seasonal, Injecte, Preservative Fre 09/20/2012  . Tdap 06/13/2016   Screening Tests Health Maintenance  Topic Date Due  . FOOT EXAM  08/06/1949  . ZOSTAVAX  11/14/2017 (Originally 08/07/1999)  . PNA vac Low Risk Adult (1 of 2 - PCV13) 11/14/2017 (Originally 08/06/2004)  . OPHTHALMOLOGY EXAM  12/03/2016  . HEMOGLOBIN A1C  05/15/2017  . TETANUS/TDAP  06/13/2026  . INFLUENZA VACCINE  Completed      Plan:  I have personally reviewed and addressed the Medicare Annual Wellness questionnaire and have noted the following in the patient's chart:  A. Medical and social history B. Use of alcohol, tobacco or illicit drugs  C. Current medications and supplements D. Functional ability and status E.   Nutritional status F.  Physical activity G. Advance directives H. List of other physicians I.  Hospitalizations, surgeries, and ER visits in previous 12 months J.  [place] such as hearing and vision if needed, cognitive and depression L. Referrals and appointments - none  In addition, I have reviewed and discussed with patient certain preventive protocols, quality metrics, and best practice recommendations. A written personalized care plan for preventive services as well as general preventive health recommendations were provided to patient.  See attached scanned questionnaire for additional information.   Signed,  Fabio Neighbors, LPN Nurse Health Advisor   MD Recommendation: follow up on diabetic foot exam and Hgb A1c. 1. Medicare annual wellness visit, subsequent Will schedule exam to follow up diabetes later today. Reviewed screening by Nurse Health Advisor. Agree with documentation and advice. 2. Need for influenza vaccination - Flu vaccine HIGH DOSE PF (Fluzone High dose)

## 2016-11-14 NOTE — Progress Notes (Addendum)
Patient: [name] Male    DOB: Aug 22, 1941   75 y.o.   MRN: [id number] Visit Date: 11/14/2016  Today's Provider: [name], PA   Chief Complaint  Patient presents with  . Diabetes  . Hypertension  . Follow-up   Subjective:    HPI  Diabetes Mellitus Type II, Follow-up:   Lab Results  Component Value Date   HGBA1C 5.4 02/25/2016   HGBA1C 5.6 11/06/2015   Last seen for diabetes 9 months ago.  Management since then includes continue medication. Advised patient if FBS stays below 100 may need to discontinue Glipizide. He reports good compliance with treatment. He is not having side effects.  Current symptoms include none  Home blood sugar records: fasting range: 87-102  Episodes of hypoglycemia? no   Current Insulin Regimen: none Weight trend: stable Current diet: in general, a "healthy" diet  , well balanced Current exercise: yard work  ------------------------------------------------------------------------   Hypertension, follow-up:  BP Readings from Last 3 Encounters:  11/14/16 131/70  11/14/16 131/70  06/19/16 122/70    He was last seen for hypertension 9 months ago.  BP at that visit was 132/84 Management since that visit includes continue medication.He reports good compliance with treatment. He is not having side effects.  He is exercising. He is adherent to low salt diet.   Outside blood pressures are being checked. He is experiencing none.  Patient denies chest pain, chest pressure/discomfort, irregular heart beat and palpitations.   Cardiovascular risk factors include advanced age (older than 71 for men, 11 for women), diabetes mellitus, hypertension and male gender.  Use of agents associated with hypertension: none.   ------------------------------------------------------------------------  Patient Active Problem List   Diagnosis Date Noted  . 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 LBP (low back pain) 08/20/2015  .  CAFL (chronic airflow limitation) ([place]) 08/20/2015  . Esophageal reflux 08/20/2015  . Cardiac murmur 08/20/2015  . 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 Personal history of methicillin resistant Staphylococcus aureus 08/20/2015  . Essential (primary) hypertension 08/20/2015  . Muscle ache 08/20/2015  . Edema, peripheral 08/20/2015  . Asthma 08/20/2015  . Allergic rhinitis, seasonal 08/20/2015  . 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 Diabetes mellitus, type 2 ([place]) 08/20/2015   Past Surgical History:  Procedure Laterality Date  . CHOLECYSTECTOMY  04/1995  . LIPOMA EXCISION  01/28/2011   No family history on file. Allergies  Allergen Reactions  . Lisinopril Cough     Previous Medications   AMLODIPINE (NORVASC) 10 MG TABLET    TAKE 1 TABLET BY MOUTH ONCE DAILY   ASPIRIN 81 MG TABLET    Take by mouth.   CINNAMON 500 MG CAPSULE    Take by mouth.   DOXYCYCLINE (VIBRA-TABS) 100 MG TABLET    Take 1 tablet (100 mg total) by mouth 2 (two) times daily.   GLIPIZIDE (GLUCOTROL XL) 2.5 MG 24 HR TABLET    TAKE 1 TABLET BY MOUTH EVERY DAY   GLUCOSE BLOOD TEST STRIP    Test fasting blood sugar daily.   LOSARTAN (COZAAR) 50 MG TABLET    TAKE 1 TABLET BY MOUTH ONCE DAILY   TRAMADOL (ULTRAM) 50 MG TABLET    Take 1 tablet (50 mg total) by mouth every 8 (eight) hours as needed.    Review of Systems  Constitutional: Negative.   Respiratory: Negative.   Cardiovascular: Negative.   Endocrine: Negative.   Musculoskeletal: Negative.  Social History  Substance Use Topics  . Smoking status: Former Smoker    Types: Cigarettes  . Smokeless tobacco: Former Systems developer    Types: Chew     Comment: 75 years old for 1 year  . Alcohol use No   Objective:   BP 131/70 (BP Location: Right Arm, Patient Position: Sitting, Cuff Size: Normal)   Pulse 72   Temp 99.1 F (37.3 C) (Oral)   Wt 182 lb 12.8 oz (82.9 kg)   BMI 24.79 kg/m  Wt Readings  from Last 3 Encounters:  11/14/16 182 lb 12.8 oz (82.9 kg)  11/14/16 182 lb 8 oz (82.8 kg)  06/16/16 183 lb (83 kg)    Physical Exam  Constitutional: He is oriented to person, place, and time. He appears well-developed and well-nourished.  HENT:  Head: Normocephalic.  Right Ear: External ear normal.  Left Ear: External ear normal.  Nose: Nose normal.  Mouth/Throat: Oropharynx is clear and moist.  Eyes: Conjunctivae and EOM are normal.  Neck: Neck supple.  Cardiovascular: Normal rate and regular rhythm.   Murmur heard. Grade 3/6 systolic murmur loudest at the apex - quiet at the right 2nd ICS at the sternal border.  Pulmonary/Chest: Effort normal and breath sounds normal.  Abdominal: Soft. Bowel sounds are normal.  Musculoskeletal:  Healed scars from past trauma left lower leg. Still has 2-3 + pitting edema of the left lower leg and foot. Controlled with support hose. No pain. Good pulses throughout. Limping gait and using a cane for support and balance. Dorsal kyphosis with history of degenerative disease of spine.  Lymphadenopathy:    He has no cervical adenopathy.  Neurological: He is alert and oriented to person, place, and time.  Psychiatric: He has a normal mood and affect. His behavior is normal. Thought content normal.      Assessment & Plan:     1. Type 2 diabetes mellitus with hyperosmolarity without coma, without long-term current use of insulin (HCC) Feeling well and keeping BMI around 24. FBS 87-102. Intermittently using Glipizide. Recommend continuing diabetic diet and discontinue Glipizide if FBS under 120.  Urine microalbumin was 50 today. Still on the Losartan daily (cough side effect to use of Lisinopril). Recheck labs and follow up pending reports - CBC with Differential/Platelet - Comprehensive metabolic panel - Lipid panel - Hemoglobin A1c  2. Essential (primary) hypertension Stable BP. No chest pains or palpitations. Only peripheral edema in the left lower  leg and foot secondary to recent crush injury. Tolerating Norvasc 10 mg qd and Losartan 50 mg qd without side effects. Recheck routine labs and follow up pending reports. - CBC with Differential/Platelet - Comprehensive metabolic panel - Lipid panel - TSH  3. Edema, peripheral Onset in the left lower leg and foot since sustaining a crush injury in July 2017. Infected skin tears have healed well. Less swelling if he remembers to use support hose. Usually gone in the mornings and returns in the evening without hose. Recheck labs and use support hose regularly. - CBC with Differential/Platelet - Comprehensive metabolic panel - TSH  4. Cardiac murmur Louder murmur in the apex - quieter along the right upper sternal border. Past echocardiograms showed EF 55% with some mild mitral and tricuspid insufficiencies. Should consider recheck with cardiologist and follow up echocardiogram this soon.  Flu shot given today.

## 2016-11-18 ENCOUNTER — Telehealth: Payer: Self-pay

## 2016-11-18 LAB — CBC WITH DIFFERENTIAL/PLATELET
BASOS: 1 %
Basophils Absolute: 0 10*3/uL (ref 0.0–0.2)
EOS (ABSOLUTE): 0.2 10*3/uL (ref 0.0–0.4)
EOS: 3 %
HEMATOCRIT: 45.1 % (ref 37.5–51.0)
Hemoglobin: 15.4 g/dL (ref 13.0–17.7)
Immature Grans (Abs): 0 10*3/uL (ref 0.0–0.1)
Immature Granulocytes: 0 %
LYMPHS ABS: 1.8 10*3/uL (ref 0.7–3.1)
Lymphs: 28 %
MCH: 31.2 pg (ref 26.6–33.0)
MCHC: 34.1 g/dL (ref 31.5–35.7)
MCV: 92 fL (ref 79–97)
MONOS ABS: 0.5 10*3/uL (ref 0.1–0.9)
Monocytes: 7 %
NEUTROS ABS: 4 10*3/uL (ref 1.4–7.0)
Neutrophils: 61 %
Platelets: 193 10*3/uL (ref 150–379)
RBC: 4.93 x10E6/uL (ref 4.14–5.80)
RDW: 13.5 % (ref 12.3–15.4)
WBC: 6.6 10*3/uL (ref 3.4–10.8)

## 2016-11-18 LAB — HEMOGLOBIN A1C
ESTIMATED AVERAGE GLUCOSE: 100 mg/dL
HEMOGLOBIN A1C: 5.1 % (ref 4.8–5.6)

## 2016-11-18 LAB — COMPREHENSIVE METABOLIC PANEL
A/G RATIO: 2 (ref 1.2–2.2)
ALT: 15 IU/L (ref 0–44)
AST: 15 IU/L (ref 0–40)
Albumin: 4.2 g/dL (ref 3.5–4.8)
Alkaline Phosphatase: 81 IU/L (ref 39–117)
BUN / CREAT RATIO: 30 — AB (ref 10–24)
BUN: 21 mg/dL (ref 8–27)
Bilirubin Total: 0.7 mg/dL (ref 0.0–1.2)
CO2: 25 mmol/L (ref 18–29)
Calcium: 9.2 mg/dL (ref 8.6–10.2)
Chloride: 105 mmol/L (ref 96–106)
Creatinine, Ser: 0.71 mg/dL — ABNORMAL LOW (ref 0.76–1.27)
GFR calc Af Amer: 105 mL/min/{1.73_m2} (ref >59)
GFR calc non Af Amer: 90 mL/min/{1.73_m2} (ref >59)
GLOBULIN, TOTAL: 2.1 g/dL (ref 1.5–4.5)
Glucose: 111 mg/dL — ABNORMAL HIGH (ref 65–99)
POTASSIUM: 4.3 mmol/L (ref 3.5–5.2)
SODIUM: 141 mmol/L (ref 134–144)
Total Protein: 6.3 g/dL (ref 6.0–8.5)

## 2016-11-18 LAB — LIPID PANEL
CHOL/HDL RATIO: 2.9 ratio (ref 0.0–5.0)
CHOLESTEROL TOTAL: 111 mg/dL (ref 100–199)
HDL: 38 mg/dL — ABNORMAL LOW (ref >39)
LDL Calculated: 61 mg/dL (ref 0–99)
TRIGLYCERIDES: 58 mg/dL (ref 0–149)
VLDL Cholesterol Cal: 12 mg/dL (ref 5–40)

## 2016-11-18 LAB — TSH: TSH: 2.02 u[IU]/mL (ref 0.450–4.500)

## 2016-11-18 NOTE — Telephone Encounter (Signed)
Patient advised.

## 2016-11-18 NOTE — Telephone Encounter (Signed)
-----   Message from Margo Common, [place] sent at 11/18/2016  8:20 AM EST ----- All blood tests essentially normal. Hgb A1C is very good. Recheck in 4-6 months.

## 2016-11-18 NOTE — Telephone Encounter (Signed)
-----   Message from Margo Common, [place] sent at 11/17/2016  5:40 PM EST ----- Normal CBC. Awaiting remainder of blood test results.

## 2016-11-25 ENCOUNTER — Other Ambulatory Visit: Payer: Self-pay | Admitting: Family Medicine

## 2016-11-25 DIAGNOSIS — E119 Type 2 diabetes mellitus without complications: Secondary | ICD-10-CM

## 2016-11-26 NOTE — Telephone Encounter (Signed)
Last ov 11/14/16. Last filled 11/01/15. Please review. Thank you. sd

## 2016-12-16 ENCOUNTER — Encounter: Payer: Medicare HMO | Attending: Internal Medicine | Admitting: Internal Medicine

## 2016-12-16 DIAGNOSIS — J45909 Unspecified asthma, uncomplicated: Secondary | ICD-10-CM | POA: Insufficient documentation

## 2016-12-16 DIAGNOSIS — Z7984 Long term (current) use of oral hypoglycemic drugs: Secondary | ICD-10-CM | POA: Diagnosis not present

## 2016-12-16 DIAGNOSIS — M25572 Pain in left ankle and joints of left foot: Secondary | ICD-10-CM | POA: Diagnosis not present

## 2016-12-16 DIAGNOSIS — E11610 Type 2 diabetes mellitus with diabetic neuropathic arthropathy: Secondary | ICD-10-CM | POA: Diagnosis not present

## 2016-12-16 DIAGNOSIS — M7989 Other specified soft tissue disorders: Secondary | ICD-10-CM | POA: Diagnosis not present

## 2016-12-16 DIAGNOSIS — R2242 Localized swelling, mass and lump, left lower limb: Secondary | ICD-10-CM | POA: Diagnosis not present

## 2016-12-16 DIAGNOSIS — M1990 Unspecified osteoarthritis, unspecified site: Secondary | ICD-10-CM | POA: Diagnosis not present

## 2016-12-16 DIAGNOSIS — Z87891 Personal history of nicotine dependence: Secondary | ICD-10-CM | POA: Diagnosis not present

## 2016-12-16 DIAGNOSIS — I10 Essential (primary) hypertension: Secondary | ICD-10-CM | POA: Insufficient documentation

## 2016-12-16 DIAGNOSIS — B1920 Unspecified viral hepatitis C without hepatic coma: Secondary | ICD-10-CM | POA: Insufficient documentation

## 2016-12-16 NOTE — Progress Notes (Signed)
TRITON, ALTICE ([id number]) Visit Report for 12/16/2016 Abuse/Suicide Risk Screen Details Patient Name: [name], [name] Date of Service: 12/16/2016 8:00 AM Medical Record Number: [id number] Patient Account Number: [id number] Date of Birth/Sex: 28-Oct-1941 (76 y.o. Male) Treating RN: Montey Hora Primary Care Physician: [name] Other Clinician: Referring Physician: [name] Treating Physician/Extender: [name] Weeks in Treatment: 0 Abuse/Suicide Risk Screen Items Answer ABUSE/SUICIDE RISK SCREEN: Has anyone close to you tried to hurt or harm you recentlyo No Do you feel uncomfortable with anyone in your familyo No Has anyone forced you do things that you didnot want to doo No Do you have any thoughts of harming yourselfo No Patient displays signs or symptoms of abuse and/or neglect. No Electronic Signature(s) Signed: 12/16/2016 2:42:35 PM By: Montey Hora Entered By: Montey Hora on 12/16/2016 08:16:12 Murlean Caller ([id number]) -------------------------------------------------------------------------------- Activities of Daily Living Details Patient Name: John Sours T. Date of Service: 12/16/2016 8:00 AM Medical Record Number: [id number] Patient Account Number: [id number] Date of Birth/Sex: Jan 17, 1941 (76 y.o. Male) Treating RN: Montey Hora Primary Care Physician: [name] Other Clinician: Referring Physician: [name] Treating Physician/Extender: [name] Weeks in Treatment: 0 Activities of Daily Living Items Answer Activities of Daily Living (Please select one for each item) Drive Automobile Completely Able Take Medications Completely Able Use Telephone Completely Able Care for Appearance Completely Able Use Toilet Completely Able Bath / Shower Completely Able Dress Self Completely Able Feed Self Completely Able Walk Completely Able Get In / Out Bed Completely Able Housework Completely Able Prepare Meals  Completely [place] for Self Completely Able Electronic Signature(s) Signed: 12/16/2016 2:42:35 PM By: Montey Hora Entered By: Montey Hora on 12/16/2016 08:16:31 Murlean Caller ([id number]) -------------------------------------------------------------------------------- Education Assessment Details Patient Name: John Sours T. Date of Service: 12/16/2016 8:00 AM Medical Record Number: [id number] Patient Account Number: [id number] Date of Birth/Sex: 1941/10/13 (76 y.o. Male) Treating RN: Montey Hora Primary Care Physician: [name] Other Clinician: Referring Physician: [name] Treating Physician/Extender: [name] in Treatment: 0 Primary Learner Assessed: Patient Learning Preferences/Education Level/Primary Language Learning Preference: Explanation, Demonstration Highest Education Level: High School Preferred Language: English Cognitive Barrier Assessment/Beliefs Language Barrier: No Translator Needed: No Memory Deficit: No Emotional Barrier: No Cultural/Religious Beliefs Affecting Medical No Care: Physical Barrier Assessment Impaired Vision: No Impaired Hearing: No Decreased Hand dexterity: No Knowledge/Comprehension Assessment Knowledge Level: Medium Comprehension Level: Medium Ability to understand written Medium instructions: Ability to understand verbal Medium instructions: Motivation Assessment Anxiety Level: Calm Cooperation: Cooperative Education Importance: Acknowledges Need Interest in Health Problems: Asks Questions Perception: Coherent Willingness to Engage in Self- Medium Management Activities: Readiness to Engage in Self- Medium Management Activities: Electronic Signature(s) ARAMIS, CRITCH[place] ([id number]) Signed: 12/16/2016 2:42:35 PM By: Montey Hora Entered By: Montey Hora on 12/16/2016 08:16:49 Murlean Caller  ([id number]) -------------------------------------------------------------------------------- Fall Risk Assessment Details Patient Name: John Sours T. Date of Service: 12/16/2016 8:00 AM Medical Record Number: [id number] Patient Account Number: [id number] Date of Birth/Sex: 1941/10/29 (76 y.o. Male) Treating RN: Montey Hora Primary Care Physician: [name] Other Clinician: Referring Physician: [name] Treating Physician/Extender: [name] Weeks in Treatment: 0 Fall Risk Assessment Items Have you had 2 or more falls in the last 12 monthso 0 No Have you had any fall that resulted in injury in the last 12 monthso 0 No FALL RISK ASSESSMENT: History of falling - immediate or within 3 months 0 No Secondary diagnosis 0 No Ambulatory aid None/bed rest/wheelchair/nurse 0 No Crutches/cane/walker  15 Yes Furniture 0 No IV Access/Saline Lock 0 No Gait/Training Normal/bed rest/immobile 0 No Weak 10 Yes Impaired 0 No Mental Status Oriented to own ability 0 Yes Electronic Signature(s) Signed: 12/16/2016 2:42:35 PM By: Montey Hora Entered By: Montey Hora on 12/16/2016 08:16:59 Murlean Caller ([id number]) -------------------------------------------------------------------------------- Foot Assessment Details Patient Name: John Sours T. Date of Service: 12/16/2016 8:00 AM Medical Record Number: [id number] Patient Account Number: [id number] Date of Birth/Sex: September 27, 1941 (76 y.o. Male) Treating RN: Montey Hora Primary Care Physician: [name] Other Clinician: Referring Physician: [name] Treating Physician/Extender: [name] Weeks in Treatment: 0 Foot Assessment Items Site Locations + = Sensation present, - = Sensation absent, C = Callus, U = Ulcer R = Redness, W = Warmth, M = Maceration, PU = Pre-ulcerative lesion F = Fissure, S = Swelling, D = Dryness Assessment Right: Left: Other Deformity: No No Prior Foot Ulcer: No  No Prior Amputation: No No Charcot Joint: No No Ambulatory Status: Ambulatory With Help Assistance Device: Cane Gait: Steady Electronic Signature(s) Signed: 12/16/2016 2:42:35 PM By: Montey Hora Entered By: Montey Hora on 12/16/2016 08:17:19 Murlean Caller ([id number]) -------------------------------------------------------------------------------- Nutrition Risk Assessment Details Patient Name: John Sours T. Date of Service: 12/16/2016 8:00 AM Medical Record Number: [id number] Patient Account Number: [id number] Date of Birth/Sex: Jul 07, 1941 (76 y.o. Male) Treating RN: Montey Hora Primary Care Physician: [name] Other Clinician: Referring Physician: [name] Treating Physician/Extender: [name] Weeks in Treatment: 0 Height (in): 68 Weight (lbs): 184 Body Mass Index (BMI): 28 Nutrition Risk Assessment Items NUTRITION RISK SCREEN: I have an illness or condition that made me change the kind and/or 0 No amount of food I eat I eat fewer than two meals per day 0 No I eat few fruits and vegetables, or milk products 0 No I have three or more drinks of beer, liquor or wine almost every day 0 No I have tooth or mouth problems that make it hard for me to eat 0 No I don't always have enough money to buy the food I need 0 No I eat alone most of the time 0 No I take three or more different prescribed or over-the-counter drugs a 1 Yes day Without wanting to, I have lost or gained 10 pounds in the last six 0 No months I am not always physically able to shop, cook and/or feed myself 0 No Nutrition Protocols Good Risk Protocol 0 No interventions needed Moderate Risk Protocol Electronic Signature(s) Signed: 12/16/2016 2:42:35 PM By: Montey Hora Entered By: Montey Hora on 12/16/2016 08:17:04
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
## 2016-12-17 NOTE — Progress Notes (Signed)
John, [name] ([id number]) Visit Report for 12/16/2016 Allergy List Details Patient Name: [name], [name] Date of Service: 12/16/2016 8:00 AM Medical Record Number: [id number] Patient Account Number: [id number] Date of Birth/Sex: 09/21/1941 (76 y.o. Male) Treating RN: Montey Hora Primary Care Physician: [name] Other Clinician: Referring Physician: [name] Treating Physician/Extender: [name] Weeks in Treatment: 0 Allergies Active Allergies lisinopril Reaction: cough Severity: Severe Allergy Notes Electronic Signature(s) Signed: 12/16/2016 2:42:35 PM By: Montey Hora Entered By: Montey Hora on 12/16/2016 08:15:00 Murlean Caller ([id number]) -------------------------------------------------------------------------------- Arrival Information Details Patient Name: John Sours T. Date of Service: 12/16/2016 8:00 AM Medical Record Number: [id number] Patient Account Number: [id number] Date of Birth/Sex: 1940-12-06 (76 y.o. Male) Treating RN: Montey Hora Primary Care Physician: [name] Other Clinician: Referring Physician: [name] Treating Physician/Extender: [name] in Treatment: 0 Visit Information Patient Arrived: Cane Arrival Time: 08:10 Accompanied By: self Transfer Assistance: None Patient Identification Verified: Yes Secondary Verification Process Completed: Yes Patient Has Alerts: Yes Patient Alerts: DMII History Since Last Visit Added or deleted any medications: No Any new allergies or adverse reactions: No Had a fall or experienced change in activities of daily living that may affect risk of falls: No Signs or symptoms of abuse/neglect since last visito No Hospitalized since last visit: No Electronic Signature(s) Signed: 12/16/2016 2:42:35 PM By: Montey Hora Entered By: Montey Hora on 12/16/2016 08:11:38 Murlean Caller  ([id number]) -------------------------------------------------------------------------------- Clinic Level of Care Assessment Details Patient Name: John Sours T. Date of Service: 12/16/2016 8:00 AM Medical Record Number: [id number] Patient Account Number: [id number] Date of Birth/Sex: March 30, 1941 (76 y.o. Male) Treating RN: Montey Hora Primary Care Physician: [name] Other Clinician: Referring Physician: [name] Treating Physician/Extender: [name] Weeks in Treatment: 0 Clinic Level of Care Assessment Items TOOL 2 Quantity Score []  - Use when only an EandM is performed on the INITIAL visit 0 ASSESSMENTS - Nursing Assessment / Reassessment X - General Physical Exam (combine w/ comprehensive assessment (listed just 1 20 below) when performed on new pt. evals) X - Comprehensive Assessment (HX, ROS, Risk Assessments, Wounds Hx, etc.) 1 25 ASSESSMENTS - Wound and Skin Assessment / Reassessment []  - Simple Wound Assessment / Reassessment - one wound 0 []  - Complex Wound Assessment / Reassessment - multiple wounds 0 X - Dermatologic / Skin Assessment (not related to wound area) 1 10 ASSESSMENTS - Ostomy and/or Continence Assessment and Care []  - Incontinence Assessment and Management 0 []  - Ostomy Care Assessment and Management (repouching, etc.) 0 PROCESS - Coordination of Care X - Simple Patient / Family Education for ongoing care 1 15 []  - Complex (extensive) Patient / Family Education for ongoing care 0 []  - Staff obtains Programmer, systems, Records, Test Results / Process Orders 0 []  - Staff telephones HHA, Nursing Homes / Clarify orders / etc 0 []  - Routine Transfer to another Facility (non-emergent condition) 0 []  - Routine Hospital Admission (non-emergent condition) 0 []  - New Admissions / Biomedical engineer / Ordering NPWT, Apligraf, etc. 0 []  - Emergency Hospital Admission (emergent condition) 0 X - Simple Discharge Coordination 1 10 [name], [name]  ([id number]) []  - Complex (extensive) Discharge Coordination 0 PROCESS - Special Needs []  - Pediatric / Minor Patient Management 0 []  - Isolation Patient Management 0 []  - Hearing / Language / Visual special needs 0 []  - Assessment of Community assistance (transportation, D/C planning, etc.) 0 []  - Additional assistance / Altered mentation 0 []  - Support Surface(s) Assessment (bed, cushion, seat,  etc.) 0 INTERVENTIONS - Wound Cleansing / Measurement []  - Wound Imaging (photographs - any number of wounds) 0 []  - Wound Tracing (instead of photographs) 0 []  - Simple Wound Measurement - one wound 0 []  - Complex Wound Measurement - multiple wounds 0 []  - Simple Wound Cleansing - one wound 0 []  - Complex Wound Cleansing - multiple wounds 0 INTERVENTIONS - Wound Dressings []  - Small Wound Dressing one or multiple wounds 0 []  - Medium Wound Dressing one or multiple wounds 0 []  - Large Wound Dressing one or multiple wounds 0 []  - Application of Medications - injection 0 INTERVENTIONS - Miscellaneous []  - External ear exam 0 []  - Specimen Collection (cultures, biopsies, blood, body fluids, etc.) 0 []  - Specimen(s) / Culture(s) sent or taken to Lab for analysis 0 []  - Patient Transfer (multiple staff / Civil Service fast streamer / Similar devices) 0 []  - Simple Staple / Suture removal (25 or less) 0 []  - Complex Staple / Suture removal (26 or more) 0 [name], [name] ([id number]) []  - Hypo / Hyperglycemic Management (close monitor of Blood Glucose) 0 X - Ankle / Brachial Index (ABI) - do not check if billed separately 1 15 Has the patient been seen at the hospital within the last three years: Yes Total Score: 95 Level Of Care: New/Established - Level 3 Electronic Signature(s) Signed: 12/16/2016 2:42:35 PM By: Montey Hora Entered By: Montey Hora on 12/16/2016 08:40:54 Murlean Caller ([id number]) -------------------------------------------------------------------------------- Encounter Discharge  Information Details Patient Name: John Sours T. Date of Service: 12/16/2016 8:00 AM Medical Record Number: [id number] Patient Account Number: [id number] Date of Birth/Sex: Feb 24, 1941 (76 y.o. Male) Treating RN: Montey Hora Primary Care Physician: [name] Other Clinician: Referring Physician: [name] Treating Physician/Extender: [name] in Treatment: 0 Encounter Discharge Information Items Discharge Pain Level: 0 Discharge Condition: Stable Ambulatory Status: Cane Discharge Destination: Home Transportation: Private Auto Accompanied By: self Schedule Follow-up Appointment: No Medication Reconciliation completed No and provided to Patient/Care Burnell Matlin: Provided on Clinical Summary of Care: 12/16/2016 Form Type Recipient Paper Patient RR Electronic Signature(s) Signed: 12/16/2016 8:44:53 AM By: Ruthine Dose Entered By: Ruthine Dose on 12/16/2016 08:44:53 [place], [name] ([id number]) -------------------------------------------------------------------------------- Lower Extremity Assessment Details Patient Name: John Sours T. Date of Service: 12/16/2016 8:00 AM Medical Record Number: [id number] Patient Account Number: [id number] Date of Birth/Sex: 01/07/1941 (76 y.o. Male) Treating RN: Montey Hora Primary Care Physician: [name] Other Clinician: Referring Physician: [name] Treating Physician/Extender: [name] Weeks in Treatment: 0 Edema Assessment Assessed: [Left: No] [Right: No] Edema: [Left: Ye] [Right: s] Calf Left: Right: Point of Measurement: 34 cm From Medial Instep 32.7 cm cm Ankle Left: Right: Point of Measurement: 12 cm From Medial Instep 22.5 cm cm Vascular Assessment Pulses: Dorsalis Pedis Palpable: [Left:Yes] Posterior Tibial Palpable: [Left:Yes] Extremity colors, hair growth, and conditions: Extremity Color: [Left:Normal] Hair Growth on Extremity: [Left:No] Temperature of  Extremity: [Left:Warm] Capillary Refill: [Left:< 3 seconds] Blood Pressure: Brachial: [Left:144] Dorsalis Pedis: 152 [Left:Dorsalis Pedis:] Ankle: Posterior Tibial: 164 [Left:Posterior Tibial: 1.14] Toe Nail Assessment Left: Right: Thick: Yes Discolored: Yes Deformed: No Improper Length and Hygiene: No Electronic Signature(s) YUDA, CUPO ([id number]) Signed: 12/16/2016 2:42:35 PM By: Montey Hora Entered By: Montey Hora on 12/16/2016 08:24:53 Murlean Caller ([id number]) -------------------------------------------------------------------------------- Multi Wound Chart Details Patient Name: John Sours T. Date of Service: 12/16/2016 8:00 AM Medical Record Number: [id number] Patient Account Number: [id number] Date of Birth/Sex: 12-06-1940 (76 y.o. Male) Treating RN: Montey Hora Primary Care Physician: [name]  Other Clinician: Referring Physician: [name] Treating Physician/Extender: [name] Weeks in Treatment: 0 Vital Signs Height(in): 68 Pulse(bpm): 70 Weight(lbs): 184 Blood Pressure 149/69 (mmHg): Body Mass Index(BMI): 28 Temperature(F): 98.0 Respiratory Rate 16 (breaths/min): Wound Assessments Treatment Notes Electronic Signature(s) Signed: 12/17/2016 7:43:54 AM By: Linton Ham MD Entered By: Linton Ham on 12/16/2016 09:12:30 Murlean Caller ([id number]) -------------------------------------------------------------------------------- Pain Assessment Details Patient Name: John Sours T. Date of Service: 12/16/2016 8:00 AM Medical Record Number: [id number] Patient Account Number: [id number] Date of Birth/Sex: [birth year] (76 y.o. Male) Treating RN: Montey Hora Primary Care Physician: [name] Other Clinician: Referring Physician: [name] Treating Physician/Extender: [name] Weeks in Treatment: 0 Active Problems Location of Pain Severity and Description of Pain Patient Has Paino  Yes Site Locations Pain Location: Generalized Pain Duration of the Pain. Constant / Intermittento Constant Pain Management and Medication Current Pain Management: Notes Topical or injectable lidocaine is offered to patient for acute pain when surgical debridement is performed. If needed, Patient is instructed to use over the counter pain medication for the following 24-48 hours after debridement. Wound care MDs do not prescribed pain medications. Patient has chronic pain or uncontrolled pain. Patient has been instructed to make an appointment with their Primary Care Physician for pain management. Electronic Signature(s) Signed: 12/16/2016 2:42:35 PM By: Montey Hora Entered By: Montey Hora on 12/16/2016 08:11:57 Murlean Caller ([id number]) -------------------------------------------------------------------------------- Patient/Caregiver Education Details Patient Name: John Sours T. Date of Service: 12/16/2016 8:00 AM Medical Record Number: [id number] Patient Account Number: [id number] Date of Birth/Gender: 09-21-1941 (76 y.o. Male) Treating RN: Montey Hora Primary Care Physician: [name] Other Clinician: Referring Physician: [name] Treating Physician/Extender: [name] in Treatment: 0 Education Assessment Education Provided To: Patient Education Topics Provided Pain: Handouts: Other: see ortho for pain Methods: Explain/Verbal Responses: State content correctly Electronic Signature(s) Signed: 12/16/2016 2:42:35 PM By: Montey Hora Entered By: Montey Hora on 12/16/2016 08:41:38 Murlean Caller ([id number]) -------------------------------------------------------------------------------- Vitals Details Patient Name: John Sours T. Date of Service: 12/16/2016 8:00 AM Medical Record Number: [id number] Patient Account Number: [id number] Date of Birth/Sex: July 15, 1941 (76 y.o. Male) Treating RN: Montey Hora Primary Care  Physician: [name] Other Clinician: Referring Physician: [name] Treating Physician/Extender: [name] Weeks in Treatment: 0 Vital Signs Time Taken: 08:12 Temperature (F): 98.0 Height (in): 68 Pulse (bpm): 70 Source: Measured Respiratory Rate (breaths/min): 16 Weight (lbs): 184 Blood Pressure (mmHg): 149/69 Source: Measured Reference Range: 80 - 120 mg / dl Body Mass Index (BMI): 28 Electronic Signature(s) Signed: 12/16/2016 2:42:35 PM By: Montey Hora Entered By: Montey Hora on 12/16/2016 08:14:31

## 2016-12-25 DIAGNOSIS — S82425D Nondisplaced transverse fracture of shaft of left fibula, subsequent encounter for closed fracture with routine healing: Secondary | ICD-10-CM | POA: Diagnosis not present

## 2017-01-27 ENCOUNTER — Encounter: Payer: Self-pay | Admitting: Family Medicine

## 2017-01-27 ENCOUNTER — Ambulatory Visit (INDEPENDENT_AMBULATORY_CARE_PROVIDER_SITE_OTHER): Payer: Medicare HMO | Admitting: Family Medicine

## 2017-01-27 VITALS — BP 108/78 | HR 67 | Temp 98.1°F | Resp 16 | Wt 182.4 lb

## 2017-01-27 DIAGNOSIS — M4716 Other spondylosis with myelopathy, lumbar region: Secondary | ICD-10-CM | POA: Diagnosis not present

## 2017-01-27 DIAGNOSIS — E1100 Type 2 diabetes mellitus with hyperosmolarity without nonketotic hyperglycemic-hyperosmolar coma (NKHHC): Secondary | ICD-10-CM

## 2017-01-27 DIAGNOSIS — I10 Essential (primary) hypertension: Secondary | ICD-10-CM

## 2017-01-27 MED ORDER — AMLODIPINE BESYLATE 10 MG PO TABS: 10.0000 mg | ORAL_TABLET | Freq: Every day | ORAL | 3 refills | Status: DC

## 2017-01-27 MED ORDER — LOSARTAN POTASSIUM 50 MG PO TABS: 50.0000 mg | ORAL_TABLET | Freq: Every day | ORAL | 3 refills | Status: DC

## 2017-01-27 NOTE — Progress Notes (Signed)
Patient: [name] Male    DOB: 11/08/1941   76 y.o.   MRN: [id number] Visit Date: 01/27/2017  Today's Provider: [name], PA   Chief Complaint  Patient presents with  . Diabetes  . Hypertension  . Follow-up   Subjective:    HPI  Diabetes Mellitus Type II, Follow-up:   Lab Results  Component Value Date   HGBA1C 5.1 11/17/2016   HGBA1C 5.4 02/25/2016   HGBA1C 5.6 11/06/2015   Last seen for diabetes 2 months ago.  Management since then includes continue diabetic diet and discontinue Glipizide if FBS under 120. He reports good compliance with treatment. He is not having side effects.  Current symptoms include none  Home blood sugar records: fasting range: 80-105  Episodes of hypoglycemia? no   Current Insulin Regimen: none Weight trend: stable Current diet: in general, a "healthy" diet   Current exercise: none since accident in July   ------------------------------------------------------------------------   Hypertension, follow-up:  BP Readings from Last 3 Encounters:  01/27/17 108/78  11/14/16 131/70  11/14/16 131/70    He was last seen for hypertension 2 months ago.  BP at that visit was 131/70. Management since that visit includes continue medication.He reports good compliance with treatment. He is not having side effects.  He is not exercising since accident in July. He is adherent to low salt diet.   Outside blood pressures are being checked. He is experiencing none.  Patient denies chest pain, chest pressure/discomfort, irregular heart beat and palpitations.   Cardiovascular risk factors include advanced age (older than 8 for men, 88 for women), diabetes mellitus, hypertension and male gender.  Use of agents associated with hypertension: none.   ------------------------------------------------------------------------  Patient Active Problem List   Diagnosis Date Noted  . 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 LBP (low  back pain) 08/20/2015  . 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 Esophageal reflux 08/20/2015  . Cardiac murmur 08/20/2015  . 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 Personal history of methicillin resistant Staphylococcus aureus 08/20/2015  . Essential (primary) hypertension 08/20/2015  . Muscle ache 08/20/2015  . Edema, peripheral 08/20/2015  . Asthma 08/20/2015  . Allergic rhinitis, seasonal 08/20/2015  . 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 Diabetes mellitus, type 2 ([place]) 08/20/2015   Past Surgical History:  Procedure Laterality Date  . CHOLECYSTECTOMY  04/1995  . LIPOMA EXCISION  01/28/2011     No family history on file.  Allergies  Allergen Reactions  . Lisinopril Cough     Previous Medications   AMLODIPINE (NORVASC) 10 MG TABLET    TAKE 1 TABLET BY MOUTH ONCE DAILY   ASPIRIN 81 MG TABLET    Take by mouth.   CINNAMON 500 MG CAPSULE    Take by mouth.   GLIPIZIDE (GLUCOTROL XL) 2.5 MG 24 HR TABLET    TAKE 1 TABLET BY MOUTH EVERY DAY   GLUCOSE BLOOD TEST STRIP    Test fasting blood sugar daily.   LOSARTAN (COZAAR) 50 MG TABLET    TAKE 1 TABLET BY MOUTH ONCE DAILY   TRAMADOL (ULTRAM) 50 MG TABLET    Take 1 tablet (50 mg total) by mouth every 8 (eight) hours as needed.    Review of Systems  Constitutional: Negative.   Respiratory: Negative.   Cardiovascular: Negative.   Endocrine: Negative.     Social History  Substance Use Topics  . Smoking status: Former Smoker  Types: Cigarettes  . Smokeless tobacco: Former Systems developer    Types: Chew     Comment: 76 years old for 1 year  . Alcohol use No   Objective:   BP 108/78 (BP Location: Right Arm, Patient Position: Sitting, Cuff Size: Normal)   Pulse 67   Temp 98.1 F (36.7 C) (Oral)   Resp 16   Wt 182 lb 6.4 oz (82.7 kg)   SpO2 99%   BMI 24.74 kg/m   Physical Exam  Constitutional: He is oriented to person, place, and time. He appears  well-developed and well-nourished. No distress.  HENT:  Head: Normocephalic and atraumatic.  Right Ear: Hearing and external ear normal.  Left Ear: Hearing and external ear normal.  Nose: Nose normal.  Mouth/Throat: Oropharynx is clear and moist.  Eyes: Conjunctivae and lids are normal. Right eye exhibits no discharge. Left eye exhibits no discharge. No scleral icterus.  Neck: Neck supple.  Cardiovascular: Normal rate and regular rhythm.   Murmur heard. Grade 2/6 today.  Pulmonary/Chest: Effort normal and breath sounds normal. No respiratory distress.  Abdominal: Soft. Bowel sounds are normal.  Musculoskeletal:  Walks with a cane due to pain in left lower back and hips from degenerative disease. No pain sitting at rest. No radiation of pain into legs. SLR's 90 degrees without pain today.  Neurological: He is alert and oriented to person, place, and time.  Normal sensation to test feet with monofilament.  Skin: Skin is intact. No lesion and no rash noted.  Psychiatric: He has a normal mood and affect. His speech is normal and behavior is normal. Thought content normal.      Assessment & Plan:     1. Type 2 diabetes mellitus with hyperosmolarity without coma, without long-term current use of insulin (HCC) Stable and well controlled with using Glipizide 2.5 mg occasionally when BS above 100-110. No polyuria, polydipsia or polyphagia. Will have ophthalmology exam 01-29-17. Normal sensation of feet to monofilament test. No calluses or corns. FBS averaging 80-105 at home. Will get routine labs and follow up in 3 months. - CBC with Differential/Platelet - Comprehensive metabolic panel - Hemoglobin A1c - Lipid panel  2. Essential (primary) hypertension Good control. Tolerating Losartan and Amlodipine daily. Changing pharmacies and need prescriptions sent. Recheck routine labs and follow up in 3 months. - CBC with Differential/Platelet - Comprehensive metabolic panel - Lipid panel - TSH -  amLODipine (NORVASC) 10 MG tablet; Take 1 tablet (10 mg total) by mouth daily.  Dispense: 90 tablet; Refill: 3 - losartan (COZAAR) 50 MG tablet; Take 1 tablet (50 mg total) by mouth daily.  Dispense: 90 tablet; Refill: 3  3. 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 Walking with a cane and no pain sitting at rest or "fishing". Gait is slow with leaning to the right. No numbness or significant changes.

## 2017-01-28 LAB — LIPID PANEL
CHOLESTEROL TOTAL: 123 mg/dL (ref 100–199)
Chol/HDL Ratio: 2.9 ratio units (ref 0.0–5.0)
HDL: 42 mg/dL (ref >39)
LDL CALC: 66 mg/dL (ref 0–99)
Triglycerides: 77 mg/dL (ref 0–149)
VLDL Cholesterol Cal: 15 mg/dL (ref 5–40)

## 2017-01-28 LAB — COMPREHENSIVE METABOLIC PANEL
A/G RATIO: 1.9 (ref 1.2–2.2)
ALT: 22 IU/L (ref 0–44)
AST: 18 IU/L (ref 0–40)
Albumin: 4.3 g/dL (ref 3.5–4.8)
Alkaline Phosphatase: 80 IU/L (ref 39–117)
BUN/Creatinine Ratio: 25 — ABNORMAL HIGH (ref 10–24)
BUN: 17 mg/dL (ref 8–27)
Bilirubin Total: 0.9 mg/dL (ref 0.0–1.2)
CALCIUM: 9.4 mg/dL (ref 8.6–10.2)
CO2: 26 mmol/L (ref 18–29)
CREATININE: 0.68 mg/dL — AB (ref 0.76–1.27)
Chloride: 100 mmol/L (ref 96–106)
GFR calc non Af Amer: 92 mL/min/{1.73_m2} (ref >59)
GFR, EST AFRICAN AMERICAN: 106 mL/min/{1.73_m2} (ref >59)
GLOBULIN, TOTAL: 2.3 g/dL (ref 1.5–4.5)
Glucose: 118 mg/dL — ABNORMAL HIGH (ref 65–99)
Potassium: 3.9 mmol/L (ref 3.5–5.2)
SODIUM: 142 mmol/L (ref 134–144)
TOTAL PROTEIN: 6.6 g/dL (ref 6.0–8.5)

## 2017-01-28 LAB — HEMOGLOBIN A1C
ESTIMATED AVERAGE GLUCOSE: 97 mg/dL
Hgb A1c MFr Bld: 5 % (ref 4.8–5.6)

## 2017-01-28 LAB — CBC WITH DIFFERENTIAL/PLATELET
BASOS: 1 %
Basophils Absolute: 0.1 10*3/uL (ref 0.0–0.2)
EOS (ABSOLUTE): 0.2 10*3/uL (ref 0.0–0.4)
EOS: 2 %
HEMATOCRIT: 47.8 % (ref 37.5–51.0)
Hemoglobin: 15.8 g/dL (ref 13.0–17.7)
IMMATURE GRANS (ABS): 0 10*3/uL (ref 0.0–0.1)
Immature Granulocytes: 0 %
LYMPHS: 27 %
Lymphocytes Absolute: 1.8 10*3/uL (ref 0.7–3.1)
MCH: 30.4 pg (ref 26.6–33.0)
MCHC: 33.1 g/dL (ref 31.5–35.7)
MCV: 92 fL (ref 79–97)
MONOCYTES: 7 %
Monocytes Absolute: 0.5 10*3/uL (ref 0.1–0.9)
NEUTROS PCT: 63 %
Neutrophils Absolute: 4.3 10*3/uL (ref 1.4–7.0)
Platelets: 215 10*3/uL (ref 150–379)
RBC: 5.19 x10E6/uL (ref 4.14–5.80)
RDW: 13.4 % (ref 12.3–15.4)
WBC: 6.9 10*3/uL (ref 3.4–10.8)

## 2017-01-28 LAB — TSH: TSH: 2.57 u[IU]/mL (ref 0.450–4.500)

## 2017-01-29 DIAGNOSIS — H35033 Hypertensive retinopathy, bilateral: Secondary | ICD-10-CM | POA: Diagnosis not present

## 2017-01-29 DIAGNOSIS — H04123 Dry eye syndrome of bilateral lacrimal glands: Secondary | ICD-10-CM | POA: Diagnosis not present

## 2017-01-29 DIAGNOSIS — E119 Type 2 diabetes mellitus without complications: Secondary | ICD-10-CM | POA: Diagnosis not present

## 2017-01-29 DIAGNOSIS — H524 Presbyopia: Secondary | ICD-10-CM | POA: Diagnosis not present

## 2017-01-29 DIAGNOSIS — H43811 Vitreous degeneration, right eye: Secondary | ICD-10-CM | POA: Diagnosis not present

## 2017-01-29 LAB — HM DIABETES EYE EXAM

## 2017-01-30 ENCOUNTER — Other Ambulatory Visit: Payer: Self-pay | Admitting: Family Medicine

## 2017-01-30 ENCOUNTER — Telehealth: Payer: Self-pay

## 2017-01-30 DIAGNOSIS — E1100 Type 2 diabetes mellitus with hyperosmolarity without nonketotic hyperglycemic-hyperosmolar coma (NKHHC): Secondary | ICD-10-CM

## 2017-01-30 MED ORDER — GLUCOSE BLOOD VI STRP
ORAL_STRIP | 12 refills | Status: DC
Start: 2017-01-30 — End: 2018-04-26

## 2017-01-30 NOTE — Telephone Encounter (Signed)
Patient is requesting a RX for a new glucometer and test strip. He states he lost his meter and has not been able to check blood sugars in a while. He does not know the name of glucometer he had.  Pharmacy-[facility]

## 2017-01-30 NOTE — Telephone Encounter (Signed)
May come by the office for a OneTouch Verio Flex glucometer and we can print the test strip prescription to go with it.

## 2017-01-30 NOTE — Telephone Encounter (Signed)
-----   Message from Margo Common, [place] sent at 01/30/2017  1:18 PM EST ----- All blood tests normal. Hgb A1C is 5.0% (as good as anyone without diabetes). Continue present medications and diet. Recheck in 6 months.

## 2017-01-30 NOTE — Telephone Encounter (Signed)
Patient advised.

## 2017-01-30 NOTE — Telephone Encounter (Signed)
Patient advised RX and glucometer is at the front desk for pick up.

## 2017-02-04 DIAGNOSIS — R69 Illness, unspecified: Secondary | ICD-10-CM | POA: Diagnosis not present

## 2017-05-01 DIAGNOSIS — R69 Illness, unspecified: Secondary | ICD-10-CM | POA: Diagnosis not present

## 2017-06-26 ENCOUNTER — Ambulatory Visit (INDEPENDENT_AMBULATORY_CARE_PROVIDER_SITE_OTHER): Payer: Medicare HMO | Admitting: Family Medicine

## 2017-06-26 ENCOUNTER — Encounter: Payer: Self-pay | Admitting: Family Medicine

## 2017-06-26 VITALS — BP 118/62 | HR 80 | Temp 97.8°F | Resp 16 | Wt 177.0 lb

## 2017-06-26 DIAGNOSIS — I10 Essential (primary) hypertension: Secondary | ICD-10-CM | POA: Diagnosis not present

## 2017-06-26 DIAGNOSIS — M4716 Other spondylosis with myelopathy, lumbar region: Secondary | ICD-10-CM | POA: Diagnosis not present

## 2017-06-26 DIAGNOSIS — E1100 Type 2 diabetes mellitus with hyperosmolarity without nonketotic hyperglycemic-hyperosmolar coma (NKHHC): Secondary | ICD-10-CM | POA: Diagnosis not present

## 2017-06-26 NOTE — Progress Notes (Signed)
Patient: [name] Male    DOB: 12-14-1940   76 y.o.   MRN: [id number] Visit Date: 06/26/2017  Today's Provider: [name], PA   Chief Complaint  Patient presents with  . Diabetes  . Hypertension   Subjective:    HPI      Diabetes Mellitus Type II, Follow-up:   Lab Results  Component Value Date   HGBA1C 5.0 01/27/2017   HGBA1C 5.1 11/17/2016   HGBA1C 5.4 02/25/2016    Last seen for diabetes 6 months ago.  Management since then includes continuing current plan of care. He reports good compliance with treatment. He is not having side effects.  Current symptoms include weight loss. Home blood sugar records: fasting range: in the 90's  Episodes of hypoglycemia? no   Current Insulin Regimen: N/A Most Recent Eye Exam: January 2018 Weight trend: decreasing steadily Current diet: in general, a "healthy" diet   Current exercise: walking  Pertinent Labs:    Component Value Date/Time   CHOL 123 01/27/2017 0859   TRIG 77 01/27/2017 0859   HDL 42 01/27/2017 0859   LDLCALC 66 01/27/2017 0859   CREATININE 0.68 (L) 01/27/2017 0859    Wt Readings from Last 3 Encounters:  06/26/17 177 lb (80.3 kg)  01/27/17 182 lb 6.4 oz (82.7 kg)  11/14/16 182 lb 12.8 oz (82.9 kg)    ------------------------------------------------------------------------  Hypertension, follow-up:  BP Readings from Last 3 Encounters:  06/26/17 118/62  01/27/17 108/78  11/14/16 131/70    He was last seen for hypertension 6 months ago.  BP at that visit was 108/78. Management since that visit includes none. He reports good compliance with treatment. He is not having side effects.  He is adherent to low salt diet.   Outside blood pressures are about 130/70. He is experiencing lower extremity edema.  Patient denies chest pain, chest pressure/discomfort, claudication, dyspnea, exertional chest pressure/discomfort, fatigue, irregular heart beat, near-syncope, orthopnea,  palpitations and syncope.   Cardiovascular risk factors include advanced age (older than 23 for men, 4 for women), diabetes mellitus, hypertension and male gender.   ------------------------------------------------------------------------ Patient Active Problem List   Diagnosis Date Noted  . 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 LBP (low back pain) 08/20/2015  . CAFL (chronic airflow limitation) ([place]) 08/20/2015  . Esophageal reflux 08/20/2015  . Cardiac murmur 08/20/2015  . 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 Personal history of methicillin resistant Staphylococcus aureus 08/20/2015  . Essential (primary) hypertension 08/20/2015  . Muscle ache 08/20/2015  . Edema, peripheral 08/20/2015  . Asthma 08/20/2015  . Allergic rhinitis, seasonal 08/20/2015  . 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 Diabetes mellitus, type 2 ([place]) 08/20/2015   Past Surgical History:  Procedure Laterality Date  . CHOLECYSTECTOMY  04/1995  . LIPOMA EXCISION  01/28/2011   History reviewed. No pertinent family history.  Allergies  Allergen Reactions  . Lisinopril Cough    Current Outpatient Prescriptions:  .  amLODipine (NORVASC) 10 MG tablet, TAKE 1 TABLET BY MOUTH ONCE DAILY, Disp: 90 tablet, Rfl: 3 .  aspirin 81 MG tablet, Take by mouth., Disp: , Rfl:  .  Cinnamon 500 MG capsule, Take by mouth., Disp: , Rfl:  .  glipiZIDE (GLUCOTROL XL) 2.5 MG 24 hr tablet, TAKE 1 TABLET BY MOUTH EVERY DAY (Patient taking differently: TAKE 1 TABLET BY MOUTH EVERY IF FASTING SUGAR IS ABOVE 100), Disp: 90 tablet, Rfl: 4 .  glucose blood test strip, Test fasting blood sugar once a day., Disp: 100 each, Rfl: 12 .  losartan (COZAAR) 50 MG tablet, TAKE 1 TABLET BY MOUTH ONCE DAILY, Disp: 90 tablet, Rfl: 3 .  traMADol (ULTRAM) 50 MG tablet, Take 1 tablet (50 mg total) by mouth every 8 (eight) hours as needed. (Patient not taking: Reported on  06/26/2017), Disp: 30 tablet, Rfl: 0  Review of Systems  Constitutional: Positive for unexpected weight change (loss). Negative for activity change, appetite change, chills, diaphoresis, fatigue and fever.  Eyes: Negative for visual disturbance.  Respiratory: Negative for shortness of breath.   Cardiovascular: Positive for leg swelling. Negative for chest pain and palpitations.  Endocrine: Negative for polydipsia, polyphagia and polyuria.    Social History  Substance Use Topics  . Smoking status: Former Smoker    Types: Cigarettes  . Smokeless tobacco: Former Systems developer    Types: Chew     Comment: 76 years old for 1 year  . Alcohol use No   Objective:   BP 118/62 (BP Location: Right Arm, Patient Position: Sitting, Cuff Size: Normal)   Pulse 80   Temp 97.8 F (36.6 C) (Oral)   Resp 16   Wt 177 lb (80.3 kg)   BMI 24.01 kg/m  Vitals:   06/26/17 1328  BP: 118/62  Pulse: 80  Resp: 16  Temp: 97.8 F (36.6 C)  TempSrc: Oral  Weight: 177 lb (80.3 kg)     Physical Exam  Constitutional: He is oriented to person, place, and time. He appears well-developed and well-nourished. No distress.  HENT:  Head: Normocephalic and atraumatic.  Right Ear: Hearing normal.  Left Ear: Hearing normal.  Nose: Nose normal.  Eyes: Conjunctivae and lids are normal. Right eye exhibits no discharge. Left eye exhibits no discharge. No scleral icterus.  Neck: Neck supple.  Cardiovascular: Normal rate.   Grade 2/6 systolic murmur right 2nd ICS at sternal border.  Pulmonary/Chest: Effort normal. No respiratory distress.  Abdominal: Bowel sounds are normal.  Musculoskeletal:  Walks with a cane due to pain in left lower back and hips from degenerative disease. No pain sitting at rest. No radiation of pain into legs. SLR's 90 degrees without pain. Much improved with use of cane. Denies pain in back or legs now. Good pulses and no joint swelling.  Neurological: He is alert and oriented to person, place, and  time.  Skin: Skin is intact. No lesion and no rash noted.  Psychiatric: He has a normal mood and affect. His speech is normal and behavior is normal. Thought content normal.      Assessment & Plan:     1. Type 2 diabetes mellitus with hyperosmolarity without coma, without long-term current use of insulin (HCC) FBS in the 90's on average. Not using the Glipizide unless FBS is above 125. No polyuria, polydipsia, vision changes or numbness/pains in feet. Recheck Hgb A1C , CMP and CBC. Recommend he stop the Glipizide and follow diet.  - CBC with Differential/Platelet - Comprehensive metabolic panel - Hemoglobin A1c  2. Essential (primary) hypertension Well controlled. Still taking the Amlodipine 10 mg qd and Losartan 50 mg qd. No side effects. Denies chest pains, palpitations, diaphoresis, dyspnea or significant peripheral edema (unless he goes without his support hose). Continue present dosages and recheck labs.  - CBC with Differential/Platelet - Comprehensive metabolic panel  3. 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 Much improved with use of cane to walk (much  less pain when he stands more upright to walk).       [name], PA  [facility]

## 2017-06-28 DIAGNOSIS — S83422A Sprain of lateral collateral ligament of left knee, initial encounter: Secondary | ICD-10-CM | POA: Diagnosis not present

## 2017-06-29 DIAGNOSIS — I10 Essential (primary) hypertension: Secondary | ICD-10-CM | POA: Diagnosis not present

## 2017-06-29 DIAGNOSIS — E1100 Type 2 diabetes mellitus with hyperosmolarity without nonketotic hyperglycemic-hyperosmolar coma (NKHHC): Secondary | ICD-10-CM | POA: Diagnosis not present

## 2017-06-30 ENCOUNTER — Telehealth: Payer: Self-pay

## 2017-06-30 LAB — CBC WITH DIFFERENTIAL/PLATELET
BASOS ABS: 0.1 10*3/uL (ref 0.0–0.2)
Basos: 1 %
EOS (ABSOLUTE): 0.2 10*3/uL (ref 0.0–0.4)
Eos: 3 %
Hematocrit: 44.9 % (ref 37.5–51.0)
Hemoglobin: 15.2 g/dL (ref 13.0–17.7)
IMMATURE GRANS (ABS): 0 10*3/uL (ref 0.0–0.1)
IMMATURE GRANULOCYTES: 0 %
LYMPHS: 29 %
Lymphocytes Absolute: 1.7 10*3/uL (ref 0.7–3.1)
MCH: 30.8 pg (ref 26.6–33.0)
MCHC: 33.9 g/dL (ref 31.5–35.7)
MCV: 91 fL (ref 79–97)
Monocytes Absolute: 0.5 10*3/uL (ref 0.1–0.9)
Monocytes: 8 %
NEUTROS PCT: 59 %
Neutrophils Absolute: 3.5 10*3/uL (ref 1.4–7.0)
PLATELETS: 200 10*3/uL (ref 150–379)
RBC: 4.93 x10E6/uL (ref 4.14–5.80)
RDW: 13.7 % (ref 12.3–15.4)
WBC: 5.9 10*3/uL (ref 3.4–10.8)

## 2017-06-30 LAB — COMPREHENSIVE METABOLIC PANEL
ALT: 19 IU/L (ref 0–44)
AST: 19 IU/L (ref 0–40)
Albumin/Globulin Ratio: 1.9 (ref 1.2–2.2)
Albumin: 4.1 g/dL (ref 3.5–4.8)
Alkaline Phosphatase: 82 IU/L (ref 39–117)
BILIRUBIN TOTAL: 0.7 mg/dL (ref 0.0–1.2)
BUN/Creatinine Ratio: 29 — ABNORMAL HIGH (ref 10–24)
BUN: 20 mg/dL (ref 8–27)
CALCIUM: 8.9 mg/dL (ref 8.6–10.2)
CHLORIDE: 103 mmol/L (ref 96–106)
CO2: 26 mmol/L (ref 20–29)
Creatinine, Ser: 0.68 mg/dL — ABNORMAL LOW (ref 0.76–1.27)
GFR, EST AFRICAN AMERICAN: 106 mL/min/{1.73_m2} (ref >59)
GFR, EST NON AFRICAN AMERICAN: 92 mL/min/{1.73_m2} (ref >59)
GLUCOSE: 110 mg/dL — AB (ref 65–99)
Globulin, Total: 2.2 g/dL (ref 1.5–4.5)
Potassium: 4.2 mmol/L (ref 3.5–5.2)
Sodium: 142 mmol/L (ref 134–144)
TOTAL PROTEIN: 6.3 g/dL (ref 6.0–8.5)

## 2017-06-30 LAB — HEMOGLOBIN A1C
Est. average glucose Bld gHb Est-mCnc: 100 mg/dL
Hgb A1c MFr Bld: 5.1 % (ref 4.8–5.6)

## 2017-06-30 NOTE — Telephone Encounter (Signed)
Pt advised and agrees with treatment plan. 

## 2017-06-30 NOTE — Telephone Encounter (Signed)
-----   Message from Margo Common, [place] sent at 06/30/2017  7:41 AM EDT ----- Blood tests normal. Hgb A1C stable in the normal range the past year. Good liver and kidney function tests. Probably won't need the Glipizide unless fasting sugars get above the 130's. Continue Cinnamon and diet plan you have been using. Recheck in 6 months.

## 2017-07-29 DIAGNOSIS — R69 Illness, unspecified: Secondary | ICD-10-CM | POA: Diagnosis not present

## 2017-09-14 DIAGNOSIS — H259 Unspecified age-related cataract: Secondary | ICD-10-CM | POA: Diagnosis not present

## 2017-09-14 DIAGNOSIS — I10 Essential (primary) hypertension: Secondary | ICD-10-CM | POA: Diagnosis not present

## 2017-09-14 DIAGNOSIS — E119 Type 2 diabetes mellitus without complications: Secondary | ICD-10-CM | POA: Diagnosis not present

## 2017-09-14 DIAGNOSIS — M47819 Spondylosis without myelopathy or radiculopathy, site unspecified: Secondary | ICD-10-CM | POA: Diagnosis not present

## 2017-09-14 DIAGNOSIS — Z6828 Body mass index (BMI) 28.0-28.9, adult: Secondary | ICD-10-CM | POA: Diagnosis not present

## 2017-09-14 DIAGNOSIS — Z87891 Personal history of nicotine dependence: Secondary | ICD-10-CM | POA: Diagnosis not present

## 2017-09-14 DIAGNOSIS — Z0000 Encounter for general adult medical examination without abnormal findings: Secondary | ICD-10-CM | POA: Diagnosis not present

## 2017-09-14 DIAGNOSIS — K08409 Partial loss of teeth, unspecified cause, unspecified class: Secondary | ICD-10-CM | POA: Diagnosis not present

## 2017-09-14 DIAGNOSIS — Z7984 Long term (current) use of oral hypoglycemic drugs: Secondary | ICD-10-CM | POA: Diagnosis not present

## 2017-09-14 DIAGNOSIS — M40209 Unspecified kyphosis, site unspecified: Secondary | ICD-10-CM | POA: Diagnosis not present

## 2017-09-17 DIAGNOSIS — R69 Illness, unspecified: Secondary | ICD-10-CM | POA: Diagnosis not present

## 2017-10-27 DIAGNOSIS — R69 Illness, unspecified: Secondary | ICD-10-CM | POA: Diagnosis not present

## 2017-11-12 DIAGNOSIS — D485 Neoplasm of uncertain behavior of skin: Secondary | ICD-10-CM | POA: Diagnosis not present

## 2017-11-12 DIAGNOSIS — L989 Disorder of the skin and subcutaneous tissue, unspecified: Secondary | ICD-10-CM | POA: Diagnosis not present

## 2017-11-13 ENCOUNTER — Telehealth: Payer: Self-pay | Admitting: Family Medicine

## 2017-11-13 NOTE — Telephone Encounter (Signed)
Amy, I wasn't sure if pt was on your list to call but just wanted to let you know pt is scheduled for his AWV with [facility] on 11/18/17 @ 145 pm and CPE with Simona Huh on 11/27/17 @ 10 am. Thanks TNP

## 2017-11-18 ENCOUNTER — Ambulatory Visit (INDEPENDENT_AMBULATORY_CARE_PROVIDER_SITE_OTHER): Payer: Medicare HMO

## 2017-11-18 VITALS — BP 144/78 | HR 76 | Temp 98.6°F | Ht 72.0 in | Wt 183.8 lb

## 2017-11-18 DIAGNOSIS — Z0000 Encounter for general adult medical examination without abnormal findings: Secondary | ICD-10-CM | POA: Diagnosis not present

## 2017-11-18 NOTE — Progress Notes (Signed)
Subjective:   [name] is a 76 y.o. male who presents for Medicare Annual/Subsequent preventive examination.  Review of Systems:  N/A  Cardiac Risk Factors include: advanced age (>61men, >61 women);diabetes mellitus;hypertension;male gender     Objective:    Vitals: BP (!) 144/78 (BP Location: Left Arm)   Pulse 76   Temp 98.6 F (37 C) (Oral)   Ht 6' (1.829 m)   Wt 183 lb 12.8 oz (83.4 kg)   BMI 24.93 kg/m   Body mass index is 24.93 kg/m.  Advanced Directives 11/18/2017 11/14/2016  Does Patient Have a Medical Advance Directive? Yes No  Type of Advance Directive Living will -  Would patient like information on creating a medical advance directive? - No - Patient declined    Tobacco Social History   Tobacco Use  Smoking Status Former Smoker  . Types: Cigarettes  Smokeless Tobacco Former Systems developer  . Types: Chew  Tobacco Comment   76 years old for 1 year     Counseling given: Not Answered Comment: 76 years old for 1 year   Clinical Intake:  Pre-visit preparation completed: Yes  Pain : No/denies pain Pain Score: 0-No pain   Nutritional Status: BMI of 19-24  Normal Nutritional Risks: None Diabetes: Yes CBG done?: No Did pt. bring in CBG monitor from home?: No  *Type 2 diabetic*  How often do you need to have someone help you when you read instructions, pamphlets, or other written materials from your doctor or pharmacy?: 1 - Never  Interpreter Needed?: No  Information entered by :: [facility], LPN  Past Medical History:  Diagnosis Date  . Diabetes mellitus without complication ([place])    type 2  . Hypertension    Past Surgical History:  Procedure Laterality Date  . CHOLECYSTECTOMY  04/1995  . LIPOMA EXCISION  01/28/2011   Family History  Problem Relation Age of Onset  . Lung cancer Son    Social History   Socioeconomic History  . Marital status: Married    Spouse name: None  . Number of children: 5  . Years of education: None  . Highest  education level: 10th grade  Social Needs  . Financial resource strain: Not hard at all  . Food insecurity - worry: Never true  . Food insecurity - inability: Never true  . Transportation needs - medical: No  . Transportation needs - non-medical: No  Occupational History  . Occupation: retired  Tobacco Use  . Smoking status: Former Smoker    Types: Cigarettes  . Smokeless tobacco: Former Systems developer    Types: Chew  . Tobacco comment: 76 years old for 1 year  Substance and Sexual Activity  . Alcohol use: No    Alcohol/week: 0.0 oz  . Drug use: No  . Sexual activity: None  Other Topics Concern  . None  Social History Narrative  . None    Outpatient Encounter Medications as of 11/18/2017  Medication Sig  . amLODipine (NORVASC) 10 MG tablet TAKE 1 TABLET BY MOUTH ONCE DAILY  . aspirin 81 MG tablet Take 81 mg by mouth daily.   . Cinnamon 500 MG capsule Take 500 mg by mouth daily.   Marland Kitchen glipiZIDE (GLUCOTROL XL) 2.5 MG 24 hr tablet TAKE 1 TABLET BY MOUTH EVERY DAY (Patient taking differently: TAKE 1 TABLET BY MOUTH EVERY IF FASTING SUGAR IS ABOVE 125)  . glucose blood test strip Test fasting blood sugar once a day.  . losartan (COZAAR) 50 MG tablet TAKE  1 TABLET BY MOUTH ONCE DAILY  . [DISCONTINUED] traMADol (ULTRAM) 50 MG tablet Take 1 tablet (50 mg total) by mouth every 8 (eight) hours as needed. (Patient not taking: Reported on 06/26/2017)   No facility-administered encounter medications on file as of 11/18/2017.     Activities of Daily Living In your present state of health, do you have any difficulty performing the following activities: 11/18/2017  Hearing? N  Vision? Y  Comment Pt states he needs a new prescription. Pt plans to f/u with Dr Gwynn Burly in 12/2017. Currently wears glasses.   Difficulty concentrating or making decisions? N  Walking or climbing stairs? N  Dressing or bathing? N  Doing errands, shopping? N  Preparing Food and eating ? N  Using the Toilet? N  In the  past six months, have you accidently leaked urine? N  Do you have problems with loss of bowel control? N  Managing your Medications? N  Managing your Finances? N  Housekeeping or managing your Housekeeping? N  Some recent data might be hidden    Patient Care Team: Chrismon, Vickki Muff, PA as PCP - General (Physician Assistant) Odette Fraction as Consulting Physician (Optometry)   Assessment:   This is a routine wellness examination for [name].  Exercise Activities and Dietary recommendations Current Exercise Habits: The patient does not participate in regular exercise at present, Exercise limited by: None identified  Goals    . Exercise 3x per week (30 min per time)     Recommend to start exercising 3 days a week for 30 minutes at a time.       Fall Risk Fall Risk  11/18/2017 11/14/2016  Falls in the past year? No No   Is the patient's home free of loose throw rugs in walkways, pet beds, electrical cords, etc?   yes      Grab bars in the bathroom? yes      Handrails on the stairs?   yes      Adequate lighting?   yes  Timed Get Up and Go Performed: N/A  Depression Screen PHQ 2/9 Scores 11/18/2017 11/18/2017 11/14/2016  PHQ - 2 Score 0 0 0  PHQ- 9 Score 0 - -    Cognitive Function: Pt declined screening today.     6CIT Screen 11/14/2016  What Year? 0 points  What month? 0 points  What time? 0 points  Count back from 20 0 points  Months in reverse 0 points  Repeat phrase 4 points  Total Score 4    Immunization History  Administered Date(s) Administered  . Influenza, High Dose Seasonal PF 11/14/2016  . Influenza, Seasonal, Injecte, Preservative Fre 09/20/2012  . Tdap 06/13/2016    Qualifies for Shingles Vaccine? Due for Shingles vaccine. Declined my offer to administer today. Education has been provided regarding the importance of this vaccine. Pt has been advised to call her insurance company to determine her out of pocket expense. Advised she may also  receive this vaccine at her local pharmacy or Health Dept. Verbalized acceptance and understanding.  Screening Tests Health Maintenance  Topic Date Due  . PNA vac Low Risk Adult (1 of 2 - PCV13) 08/06/2004  . HEMOGLOBIN A1C  12/30/2017  . FOOT EXAM  01/27/2018  . OPHTHALMOLOGY EXAM  01/29/2018  . TETANUS/TDAP  06/13/2026  . INFLUENZA VACCINE  Completed   Cancer Screenings: Lung: Low Dose CT Chest recommended if Age 69-80 years, 30 pack-year currently smoking OR have quit w/in 15years. Patient does not  qualify. Colorectal: Up to date  Additional Screenings:  Hepatitis B/HIV/Syphillis: Pt declines today.  Hepatitis C Screening: Pt declines today.     Plan:  I have personally reviewed and addressed the Medicare Annual Wellness questionnaire and have noted the following in the patient's chart:  A. Medical and social history B. Use of alcohol, tobacco or illicit drugs  C. Current medications and supplements D. Functional ability and status E.  Nutritional status F.  Physical activity G. Advance directives H. List of other physicians I.  Hospitalizations, surgeries, and ER visits in previous 12 months J.  [place] such as hearing and vision if needed, cognitive and depression L. Referrals and appointments - none  In addition, I have reviewed and discussed with patient certain preventive protocols, quality metrics, and best practice recommendations. A written personalized care plan for preventive services as well as general preventive health recommendations were provided to patient.  See attached scanned questionnaire for additional information.   Signed,  Fabio Neighbors, LPN Nurse Health Advisor   Nurse Recommendations: Pt declined the Prevnar 13 vaccine today and states he will "think about it and follow up on this with Simona Huh at his next OV." Next apt scheduled for 11/27/17.  Reviewed documentation and recommendations by the Nurse Health Advisor. Agree with  plan and will discuss with patient at the next appointment.

## 2017-11-18 NOTE — Patient Instructions (Signed)
[name] , Thank you for taking time to come for your Medicare Wellness Visit. I appreciate your ongoing commitment to your health goals. Please review the following plan we discussed and let me know if I can assist you in the future.   Screening recommendations/referrals: Colonoscopy: Up to date Recommended yearly ophthalmology/optometry visit for glaucoma screening and checkup Recommended yearly dental visit for hygiene and checkup  Vaccinations: Influenza vaccine: completed Pneumococcal vaccine: Pt declines today.  Tdap vaccine: Up to date Shingles vaccine: Pt declines today.     Advanced directives: Please bring a copy of your POA (Power of Attorney) and/or Living Will to your next appointment.   Conditions/risks identified: Recommend to start exercising 3 days a week for 30 minutes at a time.  Next appointment: 11/27/17 @ 10:00 AM  Preventive Care 65 Years and Older, Male Preventive care refers to lifestyle choices and visits with your health care provider that can promote health and wellness. What does preventive care include?  A yearly physical exam. This is also called an annual well check.  Dental exams once or twice a year.  Routine eye exams. Ask your health care provider how often you should have your eyes checked.  Personal lifestyle choices, including:  Daily care of your teeth and gums.  Regular physical activity.  Eating a healthy diet.  Avoiding tobacco and drug use.  Limiting alcohol use.  Practicing safe sex.  Taking low doses of aspirin every day.  Taking vitamin and mineral supplements as recommended by your health care provider. What happens during an annual well check? The services and screenings done by your health care provider during your annual well check will depend on your age, overall health, lifestyle risk factors, and family history of disease. Counseling  Your health care provider may ask you questions about your:  Alcohol  use.  Tobacco use.  Drug use.  Emotional well-being.  Home and relationship well-being.  Sexual activity.  Eating habits.  History of falls.  Memory and ability to understand (cognition).  Work and work Statistician. Screening  You may have the following tests or measurements:  Height, weight, and BMI.  Blood pressure.  Lipid and cholesterol levels. These may be checked every 5 years, or more frequently if you are over 38 years old.  Skin check.  Lung cancer screening. You may have this screening every year starting at age 34 if you have a 30-pack-year history of smoking and currently smoke or have quit within the past 15 years.  Fecal occult blood test (FOBT) of the stool. You may have this test every year starting at age 41.  Flexible sigmoidoscopy or colonoscopy. You may have a sigmoidoscopy every 5 years or a colonoscopy every 10 years starting at age 44.  Prostate cancer screening. Recommendations will vary depending on your family history and other risks.  Hepatitis C blood test.  Hepatitis B blood test.  Sexually transmitted disease (STD) testing.  Diabetes screening. This is done by checking your blood sugar (glucose) after you have not eaten for a while (fasting). You may have this done every 1-3 years.  Abdominal aortic aneurysm (AAA) screening. You may need this if you are a current or former smoker.  Osteoporosis. You may be screened starting at age 8 if you are at high risk. Talk with your health care provider about your test results, treatment options, and if necessary, the need for more tests. Vaccines  Your health care provider may recommend certain vaccines, such as:  Influenza vaccine. This is recommended every year.  Tetanus, diphtheria, and acellular pertussis (Tdap, Td) vaccine. You may need a Td booster every 10 years.  Zoster vaccine. You may need this after age 19.  Pneumococcal 13-valent conjugate (PCV13) vaccine. One dose is  recommended after age 32.  Pneumococcal polysaccharide (PPSV23) vaccine. One dose is recommended after age 51. Talk to your health care provider about which screenings and vaccines you need and how often you need them. This information is not intended to replace advice given to you by your health care provider. Make sure you discuss any questions you have with your health care provider. Document Released: 12/14/2015 Document Revised: 08/06/2016 Document Reviewed: 09/18/2015 Elsevier Interactive Patient Education  2017 [place] Prevention in the Home Falls can cause injuries. They can happen to people of all ages. There are many things you can do to make your home safe and to help prevent falls. What can I do on the outside of my home?  Regularly fix the edges of walkways and driveways and fix any cracks.  Remove anything that might make you trip as you walk through a door, such as a raised step or threshold.  Trim any bushes or trees on the path to your home.  Use bright outdoor lighting.  Clear any walking paths of anything that might make someone trip, such as rocks or tools.  Regularly check to see if handrails are loose or broken. Make sure that both sides of any steps have handrails.  Any raised decks and porches should have guardrails on the edges.  Have any leaves, snow, or ice cleared regularly.  Use sand or salt on walking paths during winter.  Clean up any spills in your garage right away. This includes oil or grease spills. What can I do in the bathroom?  Use night lights.  Install grab bars by the toilet and in the tub and shower. Do not use towel bars as grab bars.  Use non-skid mats or decals in the tub or shower.  If you need to sit down in the shower, use a plastic, non-slip stool.  Keep the floor dry. Clean up any water that spills on the floor as soon as it happens.  Remove soap buildup in the tub or shower regularly.  Attach bath mats  securely with double-sided non-slip rug tape.  Do not have throw rugs and other things on the floor that can make you trip. What can I do in the bedroom?  Use night lights.  Make sure that you have a light by your bed that is easy to reach.  Do not use any sheets or blankets that are too big for your bed. They should not hang down onto the floor.  Have a firm chair that has side arms. You can use this for support while you get dressed.  Do not have throw rugs and other things on the floor that can make you trip. What can I do in the kitchen?  Clean up any spills right away.  Avoid walking on wet floors.  Keep items that you use a lot in easy-to-reach places.  If you need to reach something above you, use a strong step stool that has a grab bar.  Keep electrical cords out of the way.  Do not use floor polish or wax that makes floors slippery. If you must use wax, use non-skid floor wax.  Do not have throw rugs and other things on the floor that  can make you trip. What can I do with my stairs?  Do not leave any items on the stairs.  Make sure that there are handrails on both sides of the stairs and use them. Fix handrails that are broken or loose. Make sure that handrails are as long as the stairways.  Check any carpeting to make sure that it is firmly attached to the stairs. Fix any carpet that is loose or worn.  Avoid having throw rugs at the top or bottom of the stairs. If you do have throw rugs, attach them to the floor with carpet tape.  Make sure that you have a light switch at the top of the stairs and the bottom of the stairs. If you do not have them, ask someone to add them for you. What else can I do to help prevent falls?  Wear shoes that:  Do not have high heels.  Have rubber bottoms.  Are comfortable and fit you well.  Are closed at the toe. Do not wear sandals.  If you use a stepladder:  Make sure that it is fully opened. Do not climb a closed  stepladder.  Make sure that both sides of the stepladder are locked into place.  Ask someone to hold it for you, if possible.  Clearly mark and make sure that you can see:  Any grab bars or handrails.  First and last steps.  Where the edge of each step is.  Use tools that help you move around (mobility aids) if they are needed. These include:  Canes.  Walkers.  Scooters.  Crutches.  Turn on the lights when you go into a dark area. Replace any light bulbs as soon as they burn out.  Set up your furniture so you have a clear path. Avoid moving your furniture around.  If any of your floors are uneven, fix them.  If there are any pets around you, be aware of where they are.  Review your medicines with your doctor. Some medicines can make you feel dizzy. This can increase your chance of falling. Ask your doctor what other things that you can do to help prevent falls. This information is not intended to replace advice given to you by your health care provider. Make sure you discuss any questions you have with your health care provider. Document Released: 09/13/2009 Document Revised: 04/24/2016 Document Reviewed: 12/22/2014 Elsevier Interactive Patient Education  2017 [employer].

## 2017-11-27 ENCOUNTER — Encounter: Payer: Self-pay | Admitting: Family Medicine

## 2017-11-27 ENCOUNTER — Ambulatory Visit (INDEPENDENT_AMBULATORY_CARE_PROVIDER_SITE_OTHER): Payer: Medicare HMO | Admitting: Family Medicine

## 2017-11-27 VITALS — BP 142/80 | HR 70 | Temp 98.3°F | Ht 72.0 in | Wt 181.0 lb

## 2017-11-27 DIAGNOSIS — I10 Essential (primary) hypertension: Secondary | ICD-10-CM | POA: Diagnosis not present

## 2017-11-27 DIAGNOSIS — E1100 Type 2 diabetes mellitus with hyperosmolarity without nonketotic hyperglycemic-hyperosmolar coma (NKHHC): Secondary | ICD-10-CM | POA: Diagnosis not present

## 2017-11-27 DIAGNOSIS — M4716 Other spondylosis with myelopathy, lumbar region: Secondary | ICD-10-CM

## 2017-11-27 DIAGNOSIS — Z23 Encounter for immunization: Secondary | ICD-10-CM

## 2017-11-27 DIAGNOSIS — R011 Cardiac murmur, unspecified: Secondary | ICD-10-CM | POA: Diagnosis not present

## 2017-11-27 LAB — POCT UA - MICROALBUMIN: MICROALBUMIN (UR) POC: 50 mg/L

## 2017-11-27 NOTE — Progress Notes (Signed)
Patient: [name], Male    DOB: Sep 18, 1941, 76 y.o.   MRN: [id number] Visit Date: 11/27/2017  Today's Provider: [name], PA   Chief Complaint  Patient presents with  . Annual Exam   Subjective:    Annual physical exam [name] is a 76 y.o. male who presents today for health maintenance and complete physical. He feels fairly well. He reports exercising walking daily. He reports he is sleeping well.  -----------------------------------------------------------------  AWE on 11/18/17 Tdap- 06/13/16 Flu- 09/17/17 Colonoscopy- 07/10/14 Prevnar- declined on 11/18/17    Review of Systems  Constitutional: Negative.   HENT: Positive for dental problem.   Eyes: Negative.   Respiratory: Negative.   Cardiovascular: Negative.   Gastrointestinal: Negative.   Endocrine: Negative.   Genitourinary: Negative.   Musculoskeletal: Positive for back pain.  Skin: Negative.   Allergic/Immunologic: Negative.   Neurological: Negative.   Hematological: Negative.   Psychiatric/Behavioral: Negative.     Social History      He  reports that he has quit smoking. His smoking use included cigarettes. He has quit using smokeless tobacco. His smokeless tobacco use included chew. He reports that he does not drink alcohol or use drugs.       Social History   Socioeconomic History  . Marital status: Married    Spouse name: None  . Number of children: 5  . Years of education: None  . Highest education level: 10th grade  Social Needs  . Financial resource strain: Not hard at all  . Food insecurity - worry: Never true  . Food insecurity - inability: Never true  . Transportation needs - medical: No  . Transportation needs - non-medical: No  Occupational History  . Occupation: retired  Tobacco Use  . Smoking status: Former Smoker    Types: Cigarettes  . Smokeless tobacco: Former Systems developer    Types: Chew  . Tobacco comment: 76 years old for 1 year  Substance and Sexual  Activity  . Alcohol use: No    Alcohol/week: 0.0 oz  . Drug use: No  . Sexual activity: None  Other Topics Concern  . None  Social History Narrative  . None    Past Medical History:  Diagnosis Date  . Diabetes mellitus without complication ([place])    type 2  . Hypertension    Patient Active Problem List   Diagnosis Date Noted  . Closed fracture of shaft of fibula 07/03/2016  . 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 LBP (low back pain) 08/20/2015  . 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 Esophageal reflux 08/20/2015  . Cardiac murmur 08/20/2015  . 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 Personal history of methicillin resistant Staphylococcus aureus 08/20/2015  . Essential (primary) hypertension 08/20/2015  . Muscle ache 08/20/2015  . Edema, peripheral 08/20/2015  . Asthma 08/20/2015  . Allergic rhinitis, seasonal 08/20/2015  . 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 Diabetes mellitus, type 2 ([place]) 08/20/2015   Past Surgical History:  Procedure Laterality Date  . CHOLECYSTECTOMY  04/1995  . LIPOMA EXCISION  01/28/2011   Family History        Family Status  Relation Name Status  . Mother  Deceased at age 44       unsure  . Father  Deceased at age 5       unsure  . Sister 1 Deceased  . Brother 1 Deceased  .  Sister 2 Alive  . Sister 3 Alive  . Sister 4 Alive  . Sister 5 Alive  . Brother 2 Alive  . Brother 3 Alive  . Brother 4 Alive  . Son  Alive  . Son  Alive  . Son  Alive  . Son  Alive        His family history includes Lung cancer in his son.     Allergies  Allergen Reactions  . Lisinopril Cough    Current Outpatient Medications:  .  amLODipine (NORVASC) 10 MG tablet, TAKE 1 TABLET BY MOUTH ONCE DAILY, Disp: 90 tablet, Rfl: 3 .  aspirin 81 MG tablet, Take 81 mg by mouth daily. , Disp: , Rfl:  .  Cinnamon 500 MG capsule, Take 500 mg by mouth daily. , Disp: , Rfl:    .  glipiZIDE (GLUCOTROL XL) 2.5 MG 24 hr tablet, TAKE 1 TABLET BY MOUTH EVERY DAY (Patient taking differently: TAKE 1 TABLET BY MOUTH EVERY IF FASTING SUGAR IS ABOVE 125), Disp: 90 tablet, Rfl: 4 .  glucose blood test strip, Test fasting blood sugar once a day., Disp: 100 each, Rfl: 12 .  losartan (COZAAR) 50 MG tablet, TAKE 1 TABLET BY MOUTH ONCE DAILY, Disp: 90 tablet, Rfl: 3   Patient Care Team: Georgeanne Frankland, Vickki Muff, PA as PCP - General (Physician Assistant) Odette Fraction as Consulting Physician (Optometry)      Objective:   Vitals: BP (!) 142/80 (BP Location: Right Arm, Patient Position: Sitting, Cuff Size: Normal)   Pulse 70   Temp 98.3 F (36.8 C) (Oral)   Ht 6' (1.829 m)   Wt 181 lb (82.1 kg)   SpO2 98%   BMI 24.55 kg/m  Wt Readings from Last 3 Encounters:  11/27/17 181 lb (82.1 kg)  11/18/17 183 lb 12.8 oz (83.4 kg)  06/26/17 177 lb (80.3 kg)   BP Readings from Last 3 Encounters:  11/27/17 (!) 142/80  11/18/17 (!) 144/78  06/26/17 118/62    Physical Exam  Constitutional: He is oriented to person, place, and time. He appears well-developed and well-nourished.  HENT:  Head: Normocephalic and atraumatic.  Right Ear: External ear normal.  Left Ear: External ear normal.  Nose: Nose normal.  Mouth/Throat: Oropharynx is clear and moist.  Eyes: Conjunctivae and EOM are normal. Pupils are equal, round, and reactive to light. Right eye exhibits no discharge.  Neck: Normal range of motion. Neck supple. No tracheal deviation present. No thyromegaly present.  Cardiovascular: Normal rate, regular rhythm and intact distal pulses.  Murmur heard. Approximate grade 1-2/6 murmur unchanged.  Pulmonary/Chest: Effort normal and breath sounds normal. No respiratory distress. He has no wheezes. He has no rales. He exhibits no tenderness.  Abdominal: Soft. He exhibits no distension and no mass. There is no tenderness. There is no rebound and no guarding.  Musculoskeletal: He  exhibits no edema or tenderness.  Walks with a cane due to pain in left lower back and hips from degenerative disease. No pain sitting at rest. No radiation of pain into legs. SLR's 90 degrees without pain. 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 Slight puffiness left lower leg from healed crush injury with fracture of fibula July 2017 and residual decrease sensation in the posterior lower leg and heel.  Lymphadenopathy:    He has no cervical adenopathy.  Neurological: He is alert and oriented to person, place, and time. He has normal reflexes. No cranial nerve deficit. He exhibits normal muscle  tone. Coordination normal.  Skin: Skin is warm and dry. No rash noted. No erythema.  Psychiatric: He has a normal mood and affect. His behavior is normal. Judgment and thought content normal.    Depression Screen PHQ 2/9 Scores 11/18/2017 11/18/2017 11/14/2016  PHQ - 2 Score 0 0 0  PHQ- 9 Score 0 - -     Assessment & Plan:     Routine Health Maintenance and Physical Exam  Exercise Activities and Dietary recommendations Goals    . Exercise 3x per week (30 min per time)     Recommend to start exercising 3 days a week for 30 minutes at a time.       Immunization History  Administered Date(s) Administered  . Influenza, High Dose Seasonal PF 11/14/2016  . Influenza, Seasonal, Injecte, Preservative Fre 09/20/2012  . Influenza-Unspecified 09/17/2017  . Tdap 06/13/2016    Health Maintenance  Topic Date Due  . PNA vac Low Risk Adult (1 of 2 - PCV13) 08/06/2004  . HEMOGLOBIN A1C  12/30/2017  . FOOT EXAM  01/27/2018  . OPHTHALMOLOGY EXAM  01/29/2018  . TETANUS/TDAP  06/13/2026  . INFLUENZA VACCINE  Completed    Discussed health benefits of physical activity, and encouraged him to engage in regular exercise appropriate for his age and condition.    -------------------------------------------------------------------- 1. Essential (primary) hypertension BP higher than  in the past. Has been off BP meds for 6-8 months. No chest pains or palpitations. Should limit sodium intake. Should restart Losartan 50 mg qd and plan recheck of labs in Feb. 2019. Medicare annual wellness exam by Nurse Health Advisor completed on 11-18-17. Patient wants to postpone shingles vaccination and get Prevnar-13 today.  2. Type 2 diabetes mellitus with hyperosmolarity without coma, without long-term current use of insulin (HCC) Uses Glipizide 2.5 mg qd if FBS above 125. Usually has blood sugars in the 100-110 range. Microalbumin level 50 mg/L today. Recommend he restart Losartan 50 mg qd for renal protection. Denies hypoglycemic episodes, polyuria, polydipsia or significant vision changes. Recommend podiatry and ophthalmology recheck annually. - POCT UA - Microalbumin  3. 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 Can't stand erect due to spondylosis and levoscoliosis documented on x-rays in 2015. NSAID's help with ache/pain. Apply moist heat as needed and uses a back brace/supporter. Walks with a cane.  4. Cardiac murmur Unchanged murmur. No significant peripheral edema, dyspnea, chest pains or palpitations.  5. Need for Streptococcus pneumoniae vaccination - Pneumococcal conjugate vaccine 13-valent    [name], PA  [facility]

## 2017-12-14 DIAGNOSIS — H2513 Age-related nuclear cataract, bilateral: Secondary | ICD-10-CM | POA: Diagnosis not present

## 2017-12-14 DIAGNOSIS — E119 Type 2 diabetes mellitus without complications: Secondary | ICD-10-CM | POA: Diagnosis not present

## 2017-12-14 DIAGNOSIS — H35033 Hypertensive retinopathy, bilateral: Secondary | ICD-10-CM | POA: Diagnosis not present

## 2017-12-14 DIAGNOSIS — H524 Presbyopia: Secondary | ICD-10-CM | POA: Diagnosis not present

## 2017-12-14 LAB — HM DIABETES EYE EXAM

## 2018-01-17 ENCOUNTER — Other Ambulatory Visit: Payer: Self-pay | Admitting: Family Medicine

## 2018-01-17 DIAGNOSIS — I10 Essential (primary) hypertension: Secondary | ICD-10-CM

## 2018-01-26 ENCOUNTER — Other Ambulatory Visit: Payer: Self-pay | Admitting: Family Medicine

## 2018-01-26 DIAGNOSIS — E1100 Type 2 diabetes mellitus with hyperosmolarity without nonketotic hyperglycemic-hyperosmolar coma (NKHHC): Secondary | ICD-10-CM

## 2018-01-26 DIAGNOSIS — I10 Essential (primary) hypertension: Secondary | ICD-10-CM | POA: Diagnosis not present

## 2018-01-27 LAB — CBC WITH DIFFERENTIAL/PLATELET
BASOS: 0 %
Basophils Absolute: 0 10*3/uL (ref 0.0–0.2)
EOS (ABSOLUTE): 0.1 10*3/uL (ref 0.0–0.4)
EOS: 1 %
HEMATOCRIT: 45.7 % (ref 37.5–51.0)
Hemoglobin: 15.5 g/dL (ref 13.0–17.7)
Immature Grans (Abs): 0 10*3/uL (ref 0.0–0.1)
Immature Granulocytes: 0 %
LYMPHS ABS: 1.3 10*3/uL (ref 0.7–3.1)
Lymphs: 20 %
MCH: 31.5 pg (ref 26.6–33.0)
MCHC: 33.9 g/dL (ref 31.5–35.7)
MCV: 93 fL (ref 79–97)
MONOS ABS: 0.4 10*3/uL (ref 0.1–0.9)
Monocytes: 7 %
Neutrophils Absolute: 4.5 10*3/uL (ref 1.4–7.0)
Neutrophils: 72 %
Platelets: 195 10*3/uL (ref 150–379)
RBC: 4.92 x10E6/uL (ref 4.14–5.80)
RDW: 13.6 % (ref 12.3–15.4)
WBC: 6.3 10*3/uL (ref 3.4–10.8)

## 2018-01-27 LAB — COMPREHENSIVE METABOLIC PANEL
A/G RATIO: 1.7 (ref 1.2–2.2)
ALK PHOS: 87 IU/L (ref 39–117)
ALT: 13 IU/L (ref 0–44)
AST: 14 IU/L (ref 0–40)
Albumin: 3.9 g/dL (ref 3.5–4.8)
BUN/Creatinine Ratio: 23 (ref 10–24)
BUN: 14 mg/dL (ref 8–27)
Bilirubin Total: 1 mg/dL (ref 0.0–1.2)
CO2: 23 mmol/L (ref 20–29)
Calcium: 9 mg/dL (ref 8.6–10.2)
Chloride: 101 mmol/L (ref 96–106)
Creatinine, Ser: 0.61 mg/dL — ABNORMAL LOW (ref 0.76–1.27)
GFR calc Af Amer: 111 mL/min/{1.73_m2} (ref >59)
GFR, EST NON AFRICAN AMERICAN: 96 mL/min/{1.73_m2} (ref >59)
GLOBULIN, TOTAL: 2.3 g/dL (ref 1.5–4.5)
Glucose: 99 mg/dL (ref 65–99)
POTASSIUM: 4.3 mmol/L (ref 3.5–5.2)
SODIUM: 140 mmol/L (ref 134–144)
Total Protein: 6.2 g/dL (ref 6.0–8.5)

## 2018-01-27 LAB — HEMOGLOBIN A1C
ESTIMATED AVERAGE GLUCOSE: 100 mg/dL
Hgb A1c MFr Bld: 5.1 % (ref 4.8–5.6)

## 2018-01-27 LAB — LIPID PANEL
Chol/HDL Ratio: 2.8 ratio (ref 0.0–5.0)
Cholesterol, Total: 94 mg/dL — ABNORMAL LOW (ref 100–199)
HDL: 33 mg/dL — AB (ref >39)
LDL Calculated: 48 mg/dL (ref 0–99)
Triglycerides: 64 mg/dL (ref 0–149)
VLDL Cholesterol Cal: 13 mg/dL (ref 5–40)

## 2018-01-29 DIAGNOSIS — R69 Illness, unspecified: Secondary | ICD-10-CM | POA: Diagnosis not present

## 2018-02-15 ENCOUNTER — Other Ambulatory Visit: Payer: Self-pay | Admitting: Family Medicine

## 2018-02-15 DIAGNOSIS — E119 Type 2 diabetes mellitus without complications: Secondary | ICD-10-CM

## 2018-04-06 DIAGNOSIS — H2512 Age-related nuclear cataract, left eye: Secondary | ICD-10-CM | POA: Diagnosis not present

## 2018-04-06 DIAGNOSIS — H25013 Cortical age-related cataract, bilateral: Secondary | ICD-10-CM | POA: Diagnosis not present

## 2018-04-06 DIAGNOSIS — E119 Type 2 diabetes mellitus without complications: Secondary | ICD-10-CM | POA: Diagnosis not present

## 2018-04-26 ENCOUNTER — Other Ambulatory Visit: Payer: Self-pay | Admitting: Family Medicine

## 2018-05-01 DIAGNOSIS — L402 Acrodermatitis continua: Secondary | ICD-10-CM | POA: Diagnosis not present

## 2018-06-05 DIAGNOSIS — R69 Illness, unspecified: Secondary | ICD-10-CM | POA: Diagnosis not present

## 2018-06-07 DIAGNOSIS — H2512 Age-related nuclear cataract, left eye: Secondary | ICD-10-CM | POA: Diagnosis not present

## 2018-06-08 DIAGNOSIS — H2511 Age-related nuclear cataract, right eye: Secondary | ICD-10-CM | POA: Diagnosis not present

## 2018-06-14 DIAGNOSIS — H2512 Age-related nuclear cataract, left eye: Secondary | ICD-10-CM | POA: Diagnosis not present

## 2018-06-25 DIAGNOSIS — L401 Generalized pustular psoriasis: Secondary | ICD-10-CM | POA: Diagnosis not present

## 2018-07-05 DIAGNOSIS — H2511 Age-related nuclear cataract, right eye: Secondary | ICD-10-CM | POA: Diagnosis not present

## 2018-07-07 DIAGNOSIS — E119 Type 2 diabetes mellitus without complications: Secondary | ICD-10-CM | POA: Diagnosis not present

## 2018-07-07 DIAGNOSIS — M810 Age-related osteoporosis without current pathological fracture: Secondary | ICD-10-CM | POA: Diagnosis not present

## 2018-07-07 DIAGNOSIS — G8929 Other chronic pain: Secondary | ICD-10-CM | POA: Diagnosis not present

## 2018-07-07 DIAGNOSIS — R269 Unspecified abnormalities of gait and mobility: Secondary | ICD-10-CM | POA: Diagnosis not present

## 2018-07-07 DIAGNOSIS — H04129 Dry eye syndrome of unspecified lacrimal gland: Secondary | ICD-10-CM | POA: Diagnosis not present

## 2018-07-07 DIAGNOSIS — I10 Essential (primary) hypertension: Secondary | ICD-10-CM | POA: Diagnosis not present

## 2018-07-07 DIAGNOSIS — K219 Gastro-esophageal reflux disease without esophagitis: Secondary | ICD-10-CM | POA: Diagnosis not present

## 2018-07-07 DIAGNOSIS — J309 Allergic rhinitis, unspecified: Secondary | ICD-10-CM | POA: Diagnosis not present

## 2018-07-07 DIAGNOSIS — Z7984 Long term (current) use of oral hypoglycemic drugs: Secondary | ICD-10-CM | POA: Diagnosis not present

## 2018-07-07 DIAGNOSIS — K30 Functional dyspepsia: Secondary | ICD-10-CM | POA: Diagnosis not present

## 2018-07-22 DIAGNOSIS — L401 Generalized pustular psoriasis: Secondary | ICD-10-CM | POA: Diagnosis not present

## 2018-08-03 ENCOUNTER — Ambulatory Visit: Payer: Medicare HMO | Admitting: Family Medicine

## 2018-08-06 ENCOUNTER — Encounter: Payer: Self-pay | Admitting: Family Medicine

## 2018-08-06 ENCOUNTER — Ambulatory Visit (INDEPENDENT_AMBULATORY_CARE_PROVIDER_SITE_OTHER): Payer: Medicare HMO | Admitting: Family Medicine

## 2018-08-06 VITALS — BP 138/72 | HR 78 | Temp 98.3°F | Wt 175.0 lb

## 2018-08-06 DIAGNOSIS — E1100 Type 2 diabetes mellitus with hyperosmolarity without nonketotic hyperglycemic-hyperosmolar coma (NKHHC): Secondary | ICD-10-CM

## 2018-08-06 DIAGNOSIS — E786 Lipoprotein deficiency: Secondary | ICD-10-CM

## 2018-08-06 DIAGNOSIS — I10 Essential (primary) hypertension: Secondary | ICD-10-CM | POA: Diagnosis not present

## 2018-08-06 DIAGNOSIS — Z9849 Cataract extraction status, unspecified eye: Secondary | ICD-10-CM | POA: Diagnosis not present

## 2018-08-06 LAB — POCT GLYCOSYLATED HEMOGLOBIN (HGB A1C): Hemoglobin A1C: 5.2 % (ref 4.0–5.6)

## 2018-08-06 NOTE — Progress Notes (Signed)
Patient: [name] Male    DOB: [birth year]   77 y.o.   MRN: [id number] Visit Date: 08/06/2018  Today's Provider: [name], PA   Chief Complaint  Patient presents with  . Hypertension  . Hyperlipidemia  . Diabetes   Subjective:    Hypertension  This is a chronic problem. The problem is unchanged. The problem is controlled. Pertinent negatives include no anxiety, blurred vision, chest pain, headaches, malaise/fatigue, neck pain, orthopnea, palpitations, peripheral edema, PND, shortness of breath or sweats. There are no associated agents to hypertension.  Hyperlipidemia  This is a chronic problem. The problem is controlled. Recent lipid tests were reviewed and are normal. Pertinent negatives include no chest pain or shortness of breath. Current antihyperlipidemic treatment includes herbal therapy.  Diabetes  He presents for his follow-up diabetic visit. He has type 2 diabetes mellitus. There are no hypoglycemic associated symptoms. Pertinent negatives for hypoglycemia include no headaches or sweats. Pertinent negatives for diabetes include no blurred vision, no chest pain, no fatigue, no foot paresthesias, no foot ulcerations, no polydipsia, no polyphagia, no polyuria, no visual change, no weakness and no weight loss. Symptoms are stable. He is following a generally healthy diet. There is no change (Pt's fasting sugars are between 85-110) in his home blood glucose trend. An ACE inhibitor/angiotensin II receptor blocker is being taken. He does not see a podiatrist.Eye exam is current.   Lab Results  Component Value Date   HGBA1C 5.1 01/26/2018   Lab Results  Component Value Date   CHOL 94 (L) 01/26/2018   CHOL 123 01/27/2017   CHOL 111 11/17/2016   Lab Results  Component Value Date   HDL 33 (L) 01/26/2018   HDL 42 01/27/2017   HDL 38 (L) 11/17/2016   Lab Results  Component Value Date   LDLCALC 48 01/26/2018   LDLCALC 66 01/27/2017   LDLCALC 61 11/17/2016    Lab Results  Component Value Date   TRIG 64 01/26/2018   TRIG 77 01/27/2017   TRIG 58 11/17/2016   Lab Results  Component Value Date   CHOLHDL 2.8 01/26/2018   CHOLHDL 2.9 01/27/2017   CHOLHDL 2.9 11/17/2016   No results found for: LDLDIRECT BP Readings from Last 3 Encounters:  08/06/18 138/72  11/27/17 (!) 142/80  11/18/17 (!) 144/78   Wt Readings from Last 3 Encounters:  08/06/18 175 lb (79.4 kg)  11/27/17 181 lb (82.1 kg)  11/18/17 183 lb 12.8 oz (83.4 kg)   Patient Active Problem List   Diagnosis Date Noted  . Closed fracture of shaft of fibula 07/03/2016  . 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 LBP (low back pain) 08/20/2015  . 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 Esophageal reflux 08/20/2015  . Cardiac murmur 08/20/2015  . 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 Personal history of methicillin resistant Staphylococcus aureus 08/20/2015  . Essential (primary) hypertension 08/20/2015  . Muscle ache 08/20/2015  . Edema, peripheral 08/20/2015  . Asthma 08/20/2015  . Allergic rhinitis, seasonal 08/20/2015  . 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 Diabetes mellitus, type 2 ([place]) 08/20/2015      Past Surgical History:  Procedure Laterality Date  . CHOLECYSTECTOMY  04/1995  . LIPOMA EXCISION  01/28/2011   Family History  Problem Relation Age of Onset  . Lung cancer Son    Allergies  Allergen Reactions  . Lisinopril Cough  Current Outpatient Medications:  .  amLODipine (NORVASC) 10 MG tablet, TAKE 1 TABLET (10 MG TOTAL) BY MOUTH DAILY., Disp: 90 tablet, Rfl: 3 .  aspirin 81 MG tablet, Take 81 mg by mouth daily. , Disp: , Rfl:  .  Cinnamon 500 MG capsule, Take 500 mg by mouth daily. , Disp: , Rfl:  .  glipiZIDE (GLUCOTROL XL) 2.5 MG 24 hr tablet, TAKE 1 TABLET BY MOUTH EVERY DAY, Disp: 90 tablet, Rfl: 3 .  losartan (COZAAR) 50 MG tablet, TAKE 1 TABLET (50 MG TOTAL)  BY MOUTH DAILY., Disp: 90 tablet, Rfl: 3 .  ONETOUCH VERIO test strip, TEST FASTING BLOOD SUGAR ONCE DAILY, Disp: 100 each, Rfl: 4  Review of Systems  Constitutional: Negative.  Negative for fatigue, malaise/fatigue and weight loss.  Eyes: Negative for blurred vision.  Respiratory: Negative.  Negative for shortness of breath.   Cardiovascular: Negative.  Negative for chest pain, palpitations, orthopnea and PND.  Gastrointestinal: Negative.   Endocrine: Negative.  Negative for polydipsia, polyphagia and polyuria.  Musculoskeletal: Negative for neck pain.  Neurological: Negative for weakness and headaches.   Social History   Tobacco Use  . Smoking status: Former Smoker    Types: Cigarettes  . Smokeless tobacco: Former Systems developer    Types: Chew  . Tobacco comment: 77 years old for 1 year  Substance Use Topics  . Alcohol use: No    Alcohol/week: 0.0 standard drinks   Objective:   BP 138/72 (BP Location: Right Arm, Patient Position: Sitting, Cuff Size: Normal)   Pulse 78   Temp 98.3 F (36.8 C) (Oral)   Wt 175 lb (79.4 kg)   SpO2 97%   BMI 23.73 kg/m  Vitals:   08/06/18 1513  BP: 138/72  Pulse: 78  Temp: 98.3 F (36.8 C)  TempSrc: Oral  SpO2: 97%  Weight: 175 lb (79.4 kg)   Results for orders placed or performed in visit on 08/06/18  POCT glycosylated hemoglobin (Hb A1C)  Result Value Ref Range   Hemoglobin A1C 5.2 4.0 - 5.6 %   HbA1c POC (<> result, manual entry)     HbA1c, POC (prediabetic range)     HbA1c, POC (controlled diabetic range)     Physical Exam  Constitutional: He is oriented to person, place, and time. He appears well-developed and well-nourished. No distress.  HENT:  Head: Normocephalic and atraumatic.  Right Ear: Hearing normal.  Left Ear: Hearing normal.  Nose: Nose normal.  Eyes: Conjunctivae and lids are normal. Right eye exhibits no discharge. Left eye exhibits no discharge. No scleral icterus.  Cardiovascular: Normal rate, regular rhythm and  normal heart sounds.  Pulmonary/Chest: Effort normal. No respiratory distress.  Neurological: He is alert and oriented to person, place, and time.  Skin: Skin is intact. No lesion and no rash noted.  Psychiatric: He has a normal mood and affect. His speech is normal and behavior is normal. Thought content normal.   Diabetic Foot Form - Detailed   Diabetic Foot Exam - detailed Diabetic Foot exam was performed with the following findings:  Yes 08/06/2018  3:54 PM  Visual Foot Exam completed.:  Yes  Can the patient see the bottom of their feet?:  Yes Are the shoes appropriate in style and fit?:  No Is there swelling or and abnormal foot shape?:  Yes Is there a claw toe deformity?:  No Is there elevated skin temparature?:  No Is there foot or ankle muscle weakness?:  No Normal Range of Motion:  Yes Pulse Foot Exam completed.:  Yes  Right posterior Tibialias:  Present Left posterior Tibialias:  Present  Right Dorsalis Pedis:  Present Left Dorsalis Pedis:  Present  Sensory Foot Exam Completed.:  Yes Semmes-Weinstein Monofilament Test R Site 1-Great Toe:  Pos L Site 1-Great Toe:  Pos          Assessment & Plan:     1. Essential (primary) hypertension Well controlled with taking the Losartan 50 mg qd and Amlodipine 10 mg qd. No chest pains or palpitations. Slight left ankle edema secondary to past fracture in that area. Continue salt restriction and medication regimen. Recheck routine labs and follow up pending report. - CBC with Differential/Platelet - Comprehensive metabolic panel - Lipid panel  2. Type 2 diabetes mellitus with hyperosmolarity without coma, without long-term current use of insulin (HCC) Good control of blood sugar. Hgb A1C is 5.2% today. No polyuria, polydipsia or vision changes. No longer taking the Glipizide but following a balanced diet. Will check CBC, Lipid Panel and CMP. Follow up in 4-6 months for AWE. Will come to the office on a Saturday for the flu shot  clinic. - POCT glycosylated hemoglobin (Hb A1C) - CBC with Differential/Platelet - Comprehensive metabolic panel - Lipid panel  3. Low HDL (under 40) Trying to lower fats in diet and took [employer] for a few weeks. Had to stop because of nausea. Will recheck labs to assess progress with low HDL. - Comprehensive metabolic panel - Lipid panel  4. Hx of cataract surgery, unspecified laterality Had right eye surgery 2 months ago the the left 1 month ago. Feels vision is much improved. Will have follow up in 08-16-18.       [name], PA  [facility]

## 2018-08-09 DIAGNOSIS — E786 Lipoprotein deficiency: Secondary | ICD-10-CM | POA: Diagnosis not present

## 2018-08-09 DIAGNOSIS — E1100 Type 2 diabetes mellitus with hyperosmolarity without nonketotic hyperglycemic-hyperosmolar coma (NKHHC): Secondary | ICD-10-CM | POA: Diagnosis not present

## 2018-08-09 DIAGNOSIS — I10 Essential (primary) hypertension: Secondary | ICD-10-CM | POA: Diagnosis not present

## 2018-08-10 ENCOUNTER — Telehealth: Payer: Self-pay

## 2018-08-10 LAB — CBC WITH DIFFERENTIAL/PLATELET
BASOS ABS: 0 10*3/uL (ref 0.0–0.2)
Basos: 1 %
EOS (ABSOLUTE): 0.1 10*3/uL (ref 0.0–0.4)
Eos: 1 %
Hematocrit: 45.7 % (ref 37.5–51.0)
Hemoglobin: 15.2 g/dL (ref 13.0–17.7)
IMMATURE GRANULOCYTES: 0 %
Immature Grans (Abs): 0 10*3/uL (ref 0.0–0.1)
LYMPHS: 22 %
Lymphocytes Absolute: 1.5 10*3/uL (ref 0.7–3.1)
MCH: 30.2 pg (ref 26.6–33.0)
MCHC: 33.3 g/dL (ref 31.5–35.7)
MCV: 91 fL (ref 79–97)
Monocytes Absolute: 0.5 10*3/uL (ref 0.1–0.9)
Monocytes: 7 %
NEUTROS ABS: 4.4 10*3/uL (ref 1.4–7.0)
NEUTROS PCT: 69 %
PLATELETS: 206 10*3/uL (ref 150–450)
RBC: 5.04 x10E6/uL (ref 4.14–5.80)
RDW: 13 % (ref 12.3–15.4)
WBC: 6.5 10*3/uL (ref 3.4–10.8)

## 2018-08-10 LAB — COMPREHENSIVE METABOLIC PANEL
ALT: 16 IU/L (ref 0–44)
AST: 16 IU/L (ref 0–40)
Albumin/Globulin Ratio: 2 (ref 1.2–2.2)
Albumin: 4.3 g/dL (ref 3.5–4.8)
Alkaline Phosphatase: 77 IU/L (ref 39–117)
BILIRUBIN TOTAL: 0.7 mg/dL (ref 0.0–1.2)
BUN/Creatinine Ratio: 36 — ABNORMAL HIGH (ref 10–24)
BUN: 24 mg/dL (ref 8–27)
CHLORIDE: 101 mmol/L (ref 96–106)
CO2: 25 mmol/L (ref 20–29)
Calcium: 9.4 mg/dL (ref 8.6–10.2)
Creatinine, Ser: 0.66 mg/dL — ABNORMAL LOW (ref 0.76–1.27)
GFR calc non Af Amer: 92 mL/min/{1.73_m2} (ref >59)
GFR, EST AFRICAN AMERICAN: 106 mL/min/{1.73_m2} (ref >59)
GLUCOSE: 98 mg/dL (ref 65–99)
Globulin, Total: 2.1 g/dL (ref 1.5–4.5)
Potassium: 3.9 mmol/L (ref 3.5–5.2)
Sodium: 141 mmol/L (ref 134–144)
TOTAL PROTEIN: 6.4 g/dL (ref 6.0–8.5)

## 2018-08-10 LAB — LIPID PANEL
Chol/HDL Ratio: 2.6 ratio (ref 0.0–5.0)
Cholesterol, Total: 108 mg/dL (ref 100–199)
HDL: 42 mg/dL (ref >39)
LDL Calculated: 54 mg/dL (ref 0–99)
Triglycerides: 58 mg/dL (ref 0–149)
VLDL CHOLESTEROL CAL: 12 mg/dL (ref 5–40)

## 2018-08-10 NOTE — Telephone Encounter (Signed)
Pt advised.   Thanks,   -Antoniette Peake  

## 2018-08-10 NOTE — Telephone Encounter (Signed)
-----   Message from Margo Common, [place] sent at 08/10/2018  7:33 AM EDT ----- All blood tests normal. Continue present medication and diet regimen. Recheck appointment in 4 months.

## 2018-08-31 DIAGNOSIS — R69 Illness, unspecified: Secondary | ICD-10-CM | POA: Diagnosis not present

## 2018-09-17 ENCOUNTER — Ambulatory Visit (INDEPENDENT_AMBULATORY_CARE_PROVIDER_SITE_OTHER): Payer: Medicare HMO

## 2018-09-17 DIAGNOSIS — Z23 Encounter for immunization: Secondary | ICD-10-CM | POA: Diagnosis not present

## 2018-10-21 ENCOUNTER — Other Ambulatory Visit: Payer: Self-pay | Admitting: Family Medicine

## 2018-10-21 DIAGNOSIS — I10 Essential (primary) hypertension: Secondary | ICD-10-CM

## 2018-10-21 NOTE — Telephone Encounter (Signed)
Ask pharmacy if the 100 mg Losartan can be cut in half and will the 50 mg back order resolve soon?

## 2018-10-22 ENCOUNTER — Other Ambulatory Visit: Payer: Self-pay | Admitting: Family Medicine

## 2018-10-22 DIAGNOSIS — I10 Essential (primary) hypertension: Secondary | ICD-10-CM

## 2018-10-22 MED ORDER — LOSARTAN POTASSIUM 100 MG PO TABS: 50.0000 mg | ORAL_TABLET | Freq: Every day | ORAL | 3 refills | Status: DC

## 2018-10-22 NOTE — Progress Notes (Signed)
The 50 mg Losartan tablets have been recalled and not available. The 25 mg and 100 mg tablets are available. Will change to 1/2 tablet of the 100 mg qd.

## 2018-10-22 NOTE — Telephone Encounter (Signed)
Pt advised of the changes to his medication.  Advised new rx with different mg sent to pharmacy.  dbs

## 2018-10-22 NOTE — Telephone Encounter (Signed)
Advise patient his Losartan tablet will change to 100 mg because of a recall on the 50 mg tablet. He should take only 1/2 tablet daily to maintain his current dosage. He may have to get a pill cutter at the pharmacy.

## 2018-11-03 ENCOUNTER — Telehealth: Payer: Self-pay

## 2018-11-03 NOTE — Telephone Encounter (Signed)
LM with wife to have pt CB to schedule his AWV. Needs to be scheduled any time after 11/19/18. -MM

## 2018-11-16 DIAGNOSIS — Z0100 Encounter for examination of eyes and vision without abnormal findings: Secondary | ICD-10-CM | POA: Diagnosis not present

## 2018-11-26 ENCOUNTER — Ambulatory Visit (INDEPENDENT_AMBULATORY_CARE_PROVIDER_SITE_OTHER): Payer: Medicare HMO

## 2018-11-26 VITALS — BP 144/72 | HR 79 | Temp 98.1°F | Ht 72.0 in | Wt 178.2 lb

## 2018-11-26 DIAGNOSIS — Z0000 Encounter for general adult medical examination without abnormal findings: Secondary | ICD-10-CM | POA: Diagnosis not present

## 2018-11-26 NOTE — Patient Instructions (Signed)
Mr. [name] , Thank you for taking time to come for your Medicare Wellness Visit. I appreciate your ongoing commitment to your health goals. Please review the following plan we discussed and let me know if I can assist you in the future.   Screening recommendations/referrals: Colonoscopy: No longer required.  Recommended yearly ophthalmology/optometry visit for glaucoma screening and checkup Recommended yearly dental visit for hygiene and checkup  Vaccinations: Influenza vaccine: Up to date Pneumococcal vaccine: Pneumonax 23 due 11/27/18 Tdap vaccine: Up to date, due 05/2026 Shingles vaccine: Pt declines today.     Advanced directives: Please bring a copy of your POA (Power of Attorney) and/or Living Will to your next appointment.   Conditions/risks identified: Continue cutting back on portion sizes by half and eating 3 small meals a day with two healthy snacks in between.  Next appointment: 12/06/18 with Simona Huh Chrismon.  Preventive Care 79 Years and Older, Male Preventive care refers to lifestyle choices and visits with your health care provider that can promote health and wellness. What does preventive care include?  A yearly physical exam. This is also called an annual well check.  Dental exams once or twice a year.  Routine eye exams. Ask your health care provider how often you should have your eyes checked.  Personal lifestyle choices, including:  Daily care of your teeth and gums.  Regular physical activity.  Eating a healthy diet.  Avoiding tobacco and drug use.  Limiting alcohol use.  Practicing safe sex.  Taking low doses of aspirin every day.  Taking vitamin and mineral supplements as recommended by your health care provider. What happens during an annual well check? The services and screenings done by your health care provider during your annual well check will depend on your age, overall health, lifestyle risk factors, and family history of  disease. Counseling  Your health care provider may ask you questions about your:  Alcohol use.  Tobacco use.  Drug use.  Emotional well-being.  Home and relationship well-being.  Sexual activity.  Eating habits.  History of falls.  Memory and ability to understand (cognition).  Work and work Statistician. Screening  You may have the following tests or measurements:  Height, weight, and BMI.  Blood pressure.  Lipid and cholesterol levels. These may be checked every 5 years, or more frequently if you are over 75 years old.  Skin check.  Lung cancer screening. You may have this screening every year starting at age 79 if you have a 30-pack-year history of smoking and currently smoke or have quit within the past 15 years.  Fecal occult blood test (FOBT) of the stool. You may have this test every year starting at age 79.  Flexible sigmoidoscopy or colonoscopy. You may have a sigmoidoscopy every 5 years or a colonoscopy every 10 years starting at age 79.  Prostate cancer screening. Recommendations will vary depending on your family history and other risks.  Hepatitis C blood test.  Hepatitis B blood test.  Sexually transmitted disease (STD) testing.  Diabetes screening. This is done by checking your blood sugar (glucose) after you have not eaten for a while (fasting). You may have this done every 1-3 years.  Abdominal aortic aneurysm (AAA) screening. You may need this if you are a current or former smoker.  Osteoporosis. You may be screened starting at age 79 if you are at high risk. Talk with your health care provider about your test results, treatment options, and if necessary, the need for more tests. Vaccines  Your health care provider may recommend certain vaccines, such as:  Influenza vaccine. This is recommended every year.  Tetanus, diphtheria, and acellular pertussis (Tdap, Td) vaccine. You may need a Td booster every 10 years.  Zoster vaccine. You may  need this after age 79.  Pneumococcal 13-valent conjugate (PCV13) vaccine. One dose is recommended after age 79.  Pneumococcal polysaccharide (PPSV23) vaccine. One dose is recommended after age 79. Talk to your health care provider about which screenings and vaccines you need and how often you need them. This information is not intended to replace advice given to you by your health care provider. Make sure you discuss any questions you have with your health care provider. Document Released: 12/14/2015 Document Revised: 08/06/2016 Document Reviewed: 09/18/2015 Elsevier Interactive Patient Education  2017 [place] Prevention in the Home Falls can cause injuries. They can happen to people of all ages. There are many things you can do to make your home safe and to help prevent falls. What can I do on the outside of my home?  Regularly fix the edges of walkways and driveways and fix any cracks.  Remove anything that might make you trip as you walk through a door, such as a raised step or threshold.  Trim any bushes or trees on the path to your home.  Use bright outdoor lighting.  Clear any walking paths of anything that might make someone trip, such as rocks or tools.  Regularly check to see if handrails are loose or broken. Make sure that both sides of any steps have handrails.  Any raised decks and porches should have guardrails on the edges.  Have any leaves, snow, or ice cleared regularly.  Use sand or salt on walking paths during winter.  Clean up any spills in your garage right away. This includes oil or grease spills. What can I do in the bathroom?  Use night lights.  Install grab bars by the toilet and in the tub and shower. Do not use towel bars as grab bars.  Use non-skid mats or decals in the tub or shower.  If you need to sit down in the shower, use a plastic, non-slip stool.  Keep the floor dry. Clean up any water that spills on the floor as soon as it  happens.  Remove soap buildup in the tub or shower regularly.  Attach bath mats securely with double-sided non-slip rug tape.  Do not have throw rugs and other things on the floor that can make you trip. What can I do in the bedroom?  Use night lights.  Make sure that you have a light by your bed that is easy to reach.  Do not use any sheets or blankets that are too big for your bed. They should not hang down onto the floor.  Have a firm chair that has side arms. You can use this for support while you get dressed.  Do not have throw rugs and other things on the floor that can make you trip. What can I do in the kitchen?  Clean up any spills right away.  Avoid walking on wet floors.  Keep items that you use a lot in easy-to-reach places.  If you need to reach something above you, use a strong step stool that has a grab bar.  Keep electrical cords out of the way.  Do not use floor polish or wax that makes floors slippery. If you must use wax, use non-skid floor wax.  Do  not have throw rugs and other things on the floor that can make you trip. What can I do with my stairs?  Do not leave any items on the stairs.  Make sure that there are handrails on both sides of the stairs and use them. Fix handrails that are broken or loose. Make sure that handrails are as long as the stairways.  Check any carpeting to make sure that it is firmly attached to the stairs. Fix any carpet that is loose or worn.  Avoid having throw rugs at the top or bottom of the stairs. If you do have throw rugs, attach them to the floor with carpet tape.  Make sure that you have a light switch at the top of the stairs and the bottom of the stairs. If you do not have them, ask someone to add them for you. What else can I do to help prevent falls?  Wear shoes that:  Do not have high heels.  Have rubber bottoms.  Are comfortable and fit you well.  Are closed at the toe. Do not wear sandals.  If you  use a stepladder:  Make sure that it is fully opened. Do not climb a closed stepladder.  Make sure that both sides of the stepladder are locked into place.  Ask someone to hold it for you, if possible.  Clearly mark and make sure that you can see:  Any grab bars or handrails.  First and last steps.  Where the edge of each step is.  Use tools that help you move around (mobility aids) if they are needed. These include:  Canes.  Walkers.  Scooters.  Crutches.  Turn on the lights when you go into a dark area. Replace any light bulbs as soon as they burn out.  Set up your furniture so you have a clear path. Avoid moving your furniture around.  If any of your floors are uneven, fix them.  If there are any pets around you, be aware of where they are.  Review your medicines with your doctor. Some medicines can make you feel dizzy. This can increase your chance of falling. Ask your doctor what other things that you can do to help prevent falls. This information is not intended to replace advice given to you by your health care provider. Make sure you discuss any questions you have with your health care provider. Document Released: 09/13/2009 Document Revised: 04/24/2016 Document Reviewed: 12/22/2014 Elsevier Interactive Patient Education  2017 [employer].

## 2018-11-26 NOTE — Progress Notes (Addendum)
Subjective:   [name] is a 77 y.o. male who presents for Medicare Annual/Subsequent preventive examination.  Review of Systems:  N/A  Cardiac Risk Factors include: advanced age (>40mn, >>31women);diabetes mellitus;hypertension;male gender     Objective:    Vitals: BP (!) 144/72 (BP Location: Right Arm)   Pulse 79   Temp 98.1 F (36.7 C) (Oral)   Ht 6' (1.829 m)   Wt 178 lb 3.2 oz (80.8 kg)   BMI 24.17 kg/m   Body mass index is 24.17 kg/m.  Advanced Directives 11/26/2018 11/18/2017 11/14/2016  Does Patient Have a Medical Advance Directive? Yes Yes No  Type of [place] Living will -  [place] of [place]? No - copy requested - -  Would patient like information on creating a medical advance directive? - - No - Patient declined    Tobacco Social History   Tobacco Use  Smoking Status Former Smoker  . Types: Cigarettes  Smokeless Tobacco Former USystems developer . Types: Chew  Tobacco Comment   77years old for 1 year     Counseling given: Not Answered Comment: 77years old for 1 year   Clinical Intake:  Pre-visit preparation completed: Yes  Pain : No/denies pain Pain Score: 0-No pain    Diabetes:  Is the patient diabetic?  Yes , type 2 If diabetic, was a CBG obtained today?  No  Did the patient bring in their glucometer from home?  No  How often do you monitor your CBG's? Once daily.   Financial Strains and Diabetes Management:  Are you having any financial strains with the device, your supplies or your medication? No .  Does the patient want to be seen by Chronic Care Management for management of their diabetes?  No  Would the patient like to be referred to a Nutritionist or for Diabetic Management?  No   Diabetic Exams:  Diabetic Eye Exam: Completed 12/14/17.   Diabetic Foot Exam: Completed 08/06/18.    Nutritional Status: BMI of 19-24  Normal Nutritional Risks:  None   How often do you need to have someone help you when you read instructions, pamphlets, or other written materials from your doctor or pharmacy?: 1 - Never  Interpreter Needed?: No  [facility]  Past Medical History:  Diagnosis Date  . Diabetes mellitus without complication [place]    type 2  . Hypertension    Past Surgical History:  Procedure Laterality Date  . CATARACT EXTRACTION, BILATERAL    . CHOLECYSTECTOMY  04/1995  . LIPOMA EXCISION  01/28/2011   Family History  Problem Relation Age of Onset  . Heart attack Brother   . Alzheimer's disease Sister   . Lung cancer Son    Social History   Socioeconomic History  . Marital status: Married    Spouse name: Not on file  . Number of children: 5  . Years of education: Not on file  . Highest education level: 10th grade  Occupational History  . Occupation: retired  SScientific laboratory technician . Financial resource strain: Not hard at all  . Food insecurity:    Worry: Never true    Inability: Never true  . Transportation needs:    Medical: No    Non-medical: No  Tobacco Use  . Smoking status: Former Smoker    Types: Cigarettes  . Smokeless tobacco: Former USystems developer   Types: Chew  . Tobacco  comment: 77 years old for 1 year  Substance and Sexual Activity  . Alcohol use: No    Alcohol/week: 0.0 standard drinks  . Drug use: No  . Sexual activity: Not on file  Lifestyle  . Physical activity:    Days per week: 0 days    Minutes per session: 0 min  . Stress: Not at all  Relationships  . Social connections:    Talks on phone: Patient refused    Gets together: Patient refused    Attends religious service: Patient refused    Active member of club or organization: Patient refused    Attends meetings of clubs or organizations: Patient refused    Relationship status: Patient refused  Other Topics Concern  . Not on file  Social History Narrative  . Not on file    Outpatient Encounter Medications as of  11/26/2018  Medication Sig  . amLODipine (NORVASC) 10 MG tablet TAKE 1 TABLET (10 MG TOTAL) BY MOUTH DAILY.  Marland Kitchen aspirin 81 MG tablet Take 81 mg by mouth daily.   . Blood Glucose Monitoring Suppl (ONE TOUCH ULTRA 2) w/Device KIT OneTouch Ultra2 Meter kit  . Cinnamon Bark POWD by Does not apply route daily.  Marland Kitchen losartan (COZAAR) 100 MG tablet Take 0.5 tablets (50 mg total) by mouth daily.  Glory Rosebush VERIO test strip TEST FASTING BLOOD SUGAR ONCE DAILY  . Cinnamon 500 MG capsule Take 500 mg by mouth daily.   Marland Kitchen glipiZIDE (GLUCOTROL XL) 2.5 MG 24 hr tablet TAKE 1 TABLET BY MOUTH EVERY DAY (Patient not taking: Reported on 11/26/2018)   No facility-administered encounter medications on file as of 11/26/2018.     Activities of Daily Living In your present state of health, do you have any difficulty performing the following activities: 11/26/2018  Hearing? N  Vision? N  Difficulty concentrating or making decisions? N  Walking or climbing stairs? N  Dressing or bathing? N  Doing errands, shopping? N  Preparing Food and eating ? N  Using the Toilet? N  In the past six months, have you accidently leaked urine? Y  Comment Does not wear protection.   Do you have problems with loss of bowel control? N  Managing your Medications? N  Managing your Finances? N  Housekeeping or managing your Housekeeping? N  Some recent data might be hidden    Patient Care Team: Chrismon, Vickki Muff, PA as PCP - General (Physician Assistant) Odette Fraction as Consulting Physician (Optometry)   Assessment:   This is a routine wellness examination for [name].  Exercise Activities and Dietary recommendations Current Exercise Habits: The patient has a physically strenuous job, but has no regular exercise apart from work., Exercise limited by: None identified  Goals    . DIET - REDUCE PORTION SIZE     Continue cutting back on portion sizes by half and eating 3 small meals a day with two healthy snacks in  between.    . Exercise 3x per week (30 min per time)     Recommend to start exercising 3 days a week for 30 minutes at a time.    . Increase water intake     Starting 11/14/16, I will continue to drink 6-8 glasses of water a day.       Fall Risk Fall Risk  11/26/2018 11/18/2017 11/14/2016  Falls in the past year? 0 No No  Number falls in past yr: 0 - -  Injury with Fall? 0 - -  FALL RISK PREVENTION PERTAINING TO THE HOME:  Any stairs in or around the home WITH handrails? No  Home free of loose throw rugs in walkways, pet beds, electrical cords, etc? Yes  Adequate lighting in your home to reduce risk of falls? Yes   ASSISTIVE DEVICES UTILIZED TO PREVENT FALLS:  Life alert? No  Use of a cane, walker or w/c? Yes  Grab bars in the bathroom? Yes  Shower chair or bench in shower? Yes  Elevated toilet seat or a handicapped toilet? No    TIMED UP AND GO:  Was the test performed? No .     Depression Screen PHQ 2/9 Scores 11/26/2018 11/26/2018 11/18/2017 11/18/2017  PHQ - 2 Score 0 0 0 0  PHQ- 9 Score 0 - 0 -    Cognitive Function: Declined today.      6CIT Screen 11/14/2016  What Year? 0 points  What month? 0 points  What time? 0 points  Count back from 20 0 points  Months in reverse 0 points  Repeat phrase 4 points  Total Score 4    Immunization History  Administered Date(s) Administered  . Influenza, High Dose Seasonal PF 11/14/2016, 09/17/2018  . Influenza, Seasonal, Injecte, Preservative Fre 09/20/2012  . Influenza-Unspecified 09/17/2017  . Pneumococcal Conjugate-13 11/27/2017  . Tdap 06/13/2016    Qualifies for Shingles Vaccine? Yes . Due for Shingrix. Education has been provided regarding the importance of this vaccine. Pt has been advised to call insurance company to determine out of pocket expense. Advised may also receive vaccine at local pharmacy or Health Dept. Verbalized acceptance and understanding.  Tdap: Up to date  Flu Vaccine: Up to  date  Pneumococcal Vaccine: Pneumovax due 11/27/18  Screening Tests Health Maintenance  Topic Date Due  . PNA vac Low Risk Adult (2 of 2 - PPSV23) 11/27/2018  . OPHTHALMOLOGY EXAM  12/14/2018  . HEMOGLOBIN A1C  02/04/2019  . FOOT EXAM  08/07/2019  . TETANUS/TDAP  06/13/2026  . INFLUENZA VACCINE  Completed   Cancer Screenings:  Colorectal Screening: No longer required.   Lung Cancer Screening: (Low Dose CT Chest recommended if Age 104-80 years, 30 pack-year currently smoking OR have quit w/in 15years.) does not qualify.    Additional Screening:  Vision Screening: Recommended annual ophthalmology exams for early detection of glaucoma and other disorders of the eye.  Dental Screening: Recommended annual dental exams for proper oral hygiene  Community Resource Referral:  CRR required this visit?  No        Plan:  I have personally reviewed and addressed the Medicare Annual Wellness questionnaire and have noted the following in the patient's chart:  A. Medical and social history B. Use of alcohol, tobacco or illicit drugs  C. Current medications and supplements D. Functional ability and status E.  Nutritional status F.  Physical activity G. Advance directives H. List of other physicians I.  Hospitalizations, surgeries, and ER visits in previous 12 months J.  [place] such as hearing and vision if needed, cognitive and depression L. Referrals and appointments - none  In addition, I have reviewed and discussed with patient certain preventive protocols, quality metrics, and best practice recommendations. A written personalized care plan for preventive services as well as general preventive health recommendations were provided to patient.  See attached scanned questionnaire for additional information.   Signed,  Fabio Neighbors, LPN Nurse Health Advisor   Nurse Recommendations: Pt needs a Pneumovax 23 vaccine at next OV on 12/06/18. Not  eligible until  after 11/27/18.  Reviewed documentation and plan of Nurse Health Advisor. Agree with note and recommendations.

## 2018-12-06 ENCOUNTER — Encounter: Payer: Self-pay | Admitting: Family Medicine

## 2018-12-06 ENCOUNTER — Ambulatory Visit (INDEPENDENT_AMBULATORY_CARE_PROVIDER_SITE_OTHER): Payer: Medicare HMO | Admitting: Family Medicine

## 2018-12-06 VITALS — BP 126/65 | HR 75 | Temp 98.1°F | Resp 16 | Wt 176.2 lb

## 2018-12-06 DIAGNOSIS — E1100 Type 2 diabetes mellitus with hyperosmolarity without nonketotic hyperglycemic-hyperosmolar coma (NKHHC): Secondary | ICD-10-CM

## 2018-12-06 DIAGNOSIS — I10 Essential (primary) hypertension: Secondary | ICD-10-CM | POA: Diagnosis not present

## 2018-12-06 LAB — POCT GLYCOSYLATED HEMOGLOBIN (HGB A1C)
HBA1C, POC (PREDIABETIC RANGE): 5 % — AB (ref 5.7–6.4)
HEMOGLOBIN A1C: 5 % (ref 4.0–5.6)
HEMOGLOBIN A1C: 5 % (ref 4.0–5.6)
HbA1c, POC (controlled diabetic range): 5 % (ref 0.0–7.0)

## 2018-12-06 NOTE — Progress Notes (Signed)
Patient: [name] Male    DOB: 07-17-1941   78 y.o.   MRN: [id number] Visit Date: 12/06/2018  Today's Provider: [name], PA   Chief Complaint  Patient presents with  . Follow-up    T2DM, HTN.   Subjective:     HPI  Diabetes Mellitus Type II, Follow-up: Patient here for follow-up of Type 2 diabetes mellitus.  Current symptoms/problems include none and have been stable.  Cardiovascular risk factors: advanced age (older than 8 for men, 74 for women), diabetes mellitus, dyslipidemia, hypertension and male gender Eye exam current (within one year): yes Weight trend: stable Current diet: in general, a "healthy" diet   Current exercise: none  Current monitoring regimen: home blood tests - daily Home blood sugar records: fasting range: 85-96 Any episodes of hypoglycemia? no  Per patient has been off the Glipizide for over a year. Is He on ACE inhibitor or angiotensin II receptor blocker?  Yes  losartan (Cozaar)  Hypertension: Patient here for follow-up of elevated blood pressure. He is not exercising and is adherent to low salt diet.  Blood pressure is well controlled at home. Cardiac symptoms none. Patient denies chest pain, chest pressure/discomfort, exertional chest pressure/discomfort, fatigue, irregular heart beat, lower extremity edema, near-syncope and palpitations.  Cardiovascular risk factors: advanced age (older than 45 for men, 28 for women), diabetes mellitus, hypertension and male gender.  Past Medical History:  Diagnosis Date  . Diabetes mellitus without complication ([place])    type 2  . Hypertension    Past Surgical History:  Procedure Laterality Date  . CATARACT EXTRACTION, BILATERAL    . CHOLECYSTECTOMY  04/1995  . LIPOMA EXCISION  01/28/2011   Family History  Problem Relation Age of Onset  . Heart attack Brother   . Alzheimer's disease Sister   . Lung cancer Son    Allergies  Allergen Reactions  . Lisinopril Cough    Current  Outpatient Medications:  .  amLODipine (NORVASC) 10 MG tablet, TAKE 1 TABLET (10 MG TOTAL) BY MOUTH DAILY., Disp: 90 tablet, Rfl: 3 .  aspirin 81 MG tablet, Take 81 mg by mouth daily. , Disp: , Rfl:  .  Blood Glucose Monitoring Suppl (ONE TOUCH ULTRA 2) w/Device KIT, OneTouch Ultra2 Meter kit, Disp: , Rfl:  .  Cinnamon Bark POWD, by Does not apply route daily., Disp: , Rfl:  .  losartan (COZAAR) 100 MG tablet, Take 0.5 tablets (50 mg total) by mouth daily., Disp: 45 tablet, Rfl: 3 .  ONETOUCH VERIO test strip, TEST FASTING BLOOD SUGAR ONCE DAILY, Disp: 100 each, Rfl: 4 .  Cinnamon 500 MG capsule, Take 500 mg by mouth daily. , Disp: , Rfl:  .  glipiZIDE (GLUCOTROL XL) 2.5 MG 24 hr tablet, TAKE 1 TABLET BY MOUTH EVERY DAY (Patient not taking: Reported on 11/26/2018), Disp: 90 tablet, Rfl: 3  Review of Systems  Social History   Tobacco Use  . Smoking status: Former Smoker    Types: Cigarettes  . Smokeless tobacco: Former Systems developer    Types: Chew  . Tobacco comment: 78 years old for 1 year  Substance Use Topics  . Alcohol use: No    Alcohol/week: 0.0 standard drinks     Objective:   BP 126/65 (BP Location: Right Arm, Patient Position: Sitting, Cuff Size: Normal)   Pulse 75   Temp 98.1 F (36.7 C) (Oral)   Resp 16   Wt 176 lb 3.2 oz (79.9 kg)   BMI  23.90 kg/m    Wt Readings from Last 3 Encounters:  12/06/18 176 lb 3.2 oz (79.9 kg)  11/26/18 178 lb 3.2 oz (80.8 kg)  08/06/18 175 lb (79.4 kg)    Vitals:   12/06/18 0922  BP: 126/65  Pulse: 75  Resp: 16  Temp: 98.1 F (36.7 C)  TempSrc: Oral  Weight: 176 lb 3.2 oz (79.9 kg)   Physical Exam Constitutional:      General: He is not in acute distress.    Appearance: He is well-developed.  HENT:     Head: Normocephalic and atraumatic.     Right Ear: Hearing normal.     Left Ear: Hearing normal.     Nose: Nose normal.  Eyes:     General: Lids are normal. No scleral icterus.       Right eye: No discharge.        Left eye: No  discharge.     Conjunctiva/sclera: Conjunctivae normal.  Neck:     Musculoskeletal: Neck supple.  Cardiovascular:     Rate and Rhythm: Normal rate.     Heart sounds: Normal heart sounds.  Pulmonary:     Effort: Pulmonary effort is normal. No respiratory distress.  Abdominal:     Palpations: Abdomen is soft.  Skin:    Findings: No lesion or rash.  Neurological:     Mental Status: He is alert and oriented to person, place, and time.  Psychiatric:        Speech: Speech normal.        Behavior: Behavior normal.        Thought Content: Thought content normal.       Assessment & Plan    1. Type 2 diabetes mellitus with hyperosmolarity without coma, without long-term current use of insulin (HCC) Feeling well without hypoglycemic episodes. FBS in the 80-90 range at home. No polyuria, polydipsia or polyphagia. Hgb A1C 5.0% today and no longer taking any glipizide (just controlling diet). Recheck labs and follow up pending reports. - POCT glycosylated hemoglobin (Hb A1C) - CBC with Differential/Platelet - Comprehensive metabolic panel - Lipid panel  2. Essential (primary) hypertension Well controlled with Amlodipine 10 mg qd with Losartan 50 mg qd. Continue to restrict salt intake and keeping weight well controlled. Recheck CBC, Lipid Panel and CMP. Follow up pending reports. - CBC with Differential/Platelet - Comprehensive metabolic panel - Lipid panel     [name], PA  [facility]

## 2018-12-07 DIAGNOSIS — R69 Illness, unspecified: Secondary | ICD-10-CM | POA: Diagnosis not present

## 2018-12-07 DIAGNOSIS — E1100 Type 2 diabetes mellitus with hyperosmolarity without nonketotic hyperglycemic-hyperosmolar coma (NKHHC): Secondary | ICD-10-CM | POA: Diagnosis not present

## 2018-12-07 DIAGNOSIS — I10 Essential (primary) hypertension: Secondary | ICD-10-CM | POA: Diagnosis not present

## 2018-12-08 LAB — CBC WITH DIFFERENTIAL/PLATELET
BASOS: 1 %
Basophils Absolute: 0.1 10*3/uL (ref 0.0–0.2)
EOS (ABSOLUTE): 0.1 10*3/uL (ref 0.0–0.4)
EOS: 2 %
HEMATOCRIT: 46.6 % (ref 37.5–51.0)
HEMOGLOBIN: 15.6 g/dL (ref 13.0–17.7)
Immature Grans (Abs): 0 10*3/uL (ref 0.0–0.1)
Immature Granulocytes: 1 %
LYMPHS ABS: 1.7 10*3/uL (ref 0.7–3.1)
Lymphs: 25 %
MCH: 31 pg (ref 26.6–33.0)
MCHC: 33.5 g/dL (ref 31.5–35.7)
MCV: 93 fL (ref 79–97)
MONOCYTES: 8 %
MONOS ABS: 0.5 10*3/uL (ref 0.1–0.9)
NEUTROS ABS: 4.3 10*3/uL (ref 1.4–7.0)
Neutrophils: 63 %
Platelets: 206 10*3/uL (ref 150–450)
RBC: 5.04 x10E6/uL (ref 4.14–5.80)
RDW: 12 % (ref 11.6–15.4)
WBC: 6.7 10*3/uL (ref 3.4–10.8)

## 2018-12-08 LAB — COMPREHENSIVE METABOLIC PANEL
A/G RATIO: 2.1 (ref 1.2–2.2)
ALBUMIN: 4.2 g/dL (ref 3.5–4.8)
ALK PHOS: 76 IU/L (ref 39–117)
ALT: 18 IU/L (ref 0–44)
AST: 14 IU/L (ref 0–40)
BUN / CREAT RATIO: 31 — AB (ref 10–24)
BUN: 20 mg/dL (ref 8–27)
Bilirubin Total: 0.7 mg/dL (ref 0.0–1.2)
CO2: 27 mmol/L (ref 20–29)
CREATININE: 0.65 mg/dL — AB (ref 0.76–1.27)
Calcium: 9.3 mg/dL (ref 8.6–10.2)
Chloride: 103 mmol/L (ref 96–106)
GFR calc Af Amer: 107 mL/min/{1.73_m2} (ref >59)
GFR calc non Af Amer: 93 mL/min/{1.73_m2} (ref >59)
GLOBULIN, TOTAL: 2 g/dL (ref 1.5–4.5)
Glucose: 98 mg/dL (ref 65–99)
POTASSIUM: 3.9 mmol/L (ref 3.5–5.2)
SODIUM: 143 mmol/L (ref 134–144)
Total Protein: 6.2 g/dL (ref 6.0–8.5)

## 2018-12-08 LAB — LIPID PANEL
CHOL/HDL RATIO: 2.6 ratio (ref 0.0–5.0)
CHOLESTEROL TOTAL: 102 mg/dL (ref 100–199)
HDL: 39 mg/dL — ABNORMAL LOW (ref >39)
LDL CALC: 51 mg/dL (ref 0–99)
Triglycerides: 62 mg/dL (ref 0–149)
VLDL Cholesterol Cal: 12 mg/dL (ref 5–40)

## 2018-12-10 NOTE — Telephone Encounter (Signed)
Completed AWV 11/26/18.  -MM

## 2018-12-23 DIAGNOSIS — H52222 Regular astigmatism, left eye: Secondary | ICD-10-CM | POA: Diagnosis not present

## 2018-12-23 LAB — HM DIABETES EYE EXAM

## 2019-01-03 ENCOUNTER — Ambulatory Visit (INDEPENDENT_AMBULATORY_CARE_PROVIDER_SITE_OTHER): Payer: Medicare HMO | Admitting: Family Medicine

## 2019-01-03 ENCOUNTER — Encounter: Payer: Self-pay | Admitting: Family Medicine

## 2019-01-03 VITALS — BP 144/84 | HR 79 | Temp 98.5°F | Resp 16 | Wt 180.0 lb

## 2019-01-03 DIAGNOSIS — R8281 Pyuria: Secondary | ICD-10-CM | POA: Diagnosis not present

## 2019-01-03 DIAGNOSIS — R300 Dysuria: Secondary | ICD-10-CM | POA: Diagnosis not present

## 2019-01-03 LAB — POCT URINALYSIS DIPSTICK
Appearance: NORMAL
Bilirubin, UA: NEGATIVE
Glucose, UA: NEGATIVE
NITRITE UA: NEGATIVE
Odor: NORMAL
PH UA: 6 (ref 5.0–8.0)
Protein, UA: NEGATIVE
SPEC GRAV UA: 1.01 (ref 1.010–1.025)
UROBILINOGEN UA: 0.2 U/dL

## 2019-01-03 MED ORDER — SULFAMETHOXAZOLE-TRIMETHOPRIM 800-160 MG PO TABS: 1.0000 | ORAL_TABLET | Freq: Two times a day (BID) | ORAL | 0 refills | Status: DC

## 2019-01-03 NOTE — Progress Notes (Signed)
Patient: [name] Male    DOB: 04/23/1941   78 y.o.   MRN: [id number] Visit Date: 01/03/2019  Today's Provider: [name], PA   Chief Complaint  Patient presents with  . Dysuria   Subjective:     Dysuria   This is a new problem. The current episode started in the past 7 days. The problem has been unchanged. There has been no fever. Associated symptoms include frequency and urgency. Pertinent negatives include no chills, discharge, flank pain, hematuria, hesitancy, nausea, possible pregnancy, sweats or vomiting. He has tried nothing for the symptoms.    Patient has had burning upon urination for over 1 week. Patient states he has had frequency of urination for about 3 weeks. Patent denies any other symptoms.   Allergies  Allergen Reactions  . Lisinopril Cough     Current Outpatient Medications:  .  amLODipine (NORVASC) 10 MG tablet, TAKE 1 TABLET (10 MG TOTAL) BY MOUTH DAILY., Disp: 90 tablet, Rfl: 3 .  aspirin 81 MG tablet, Take 81 mg by mouth daily. , Disp: , Rfl:  .  Blood Glucose Monitoring Suppl (ONE TOUCH ULTRA 2) w/Device KIT, OneTouch Ultra2 Meter kit, Disp: , Rfl:  .  Cinnamon 500 MG capsule, Take 500 mg by mouth daily. , Disp: , Rfl:  .  Cinnamon Bark POWD, by Does not apply route daily., Disp: , Rfl:  .  losartan (COZAAR) 100 MG tablet, Take 0.5 tablets (50 mg total) by mouth daily., Disp: 45 tablet, Rfl: 3 .  ONETOUCH VERIO test strip, TEST FASTING BLOOD SUGAR ONCE DAILY, Disp: 100 each, Rfl: 4  Review of Systems  Constitutional: Negative for appetite change, chills and fever.  Respiratory: Negative for chest tightness, shortness of breath and wheezing.   Cardiovascular: Negative for chest pain and palpitations.  Gastrointestinal: Negative for abdominal pain, nausea and vomiting.  Genitourinary: Positive for dysuria, frequency and urgency. Negative for flank pain, hematuria and hesitancy.    Social History   Tobacco Use  . Smoking status:  Former Smoker    Types: Cigarettes  . Smokeless tobacco: Former Systems developer    Types: Chew  . Tobacco comment: 78 years old for 1 year  Substance Use Topics  . Alcohol use: No    Alcohol/week: 0.0 standard drinks      Objective:   BP (!) 144/84 (BP Location: Right Arm, Patient Position: Sitting, Cuff Size: Normal)   Pulse 79   Temp 98.5 F (36.9 C) (Oral)   Resp 16   Wt 180 lb (81.6 kg)   SpO2 96%   BMI 24.41 kg/m  Vitals:   01/03/19 1517  BP: (!) 144/84  Pulse: 79  Resp: 16  Temp: 98.5 F (36.9 C)  TempSrc: Oral  SpO2: 96%  Weight: 180 lb (81.6 kg)     Physical Exam Constitutional:      General: He is not in acute distress.    Appearance: He is well-developed.  HENT:     Head: Normocephalic and atraumatic.     Right Ear: Hearing normal.     Left Ear: Hearing normal.     Nose: Nose normal.  Eyes:     General: Lids are normal. No scleral icterus.       Right eye: No discharge.        Left eye: No discharge.     Conjunctiva/sclera: Conjunctivae normal.  Pulmonary:     Effort: Pulmonary effort is normal. No respiratory distress.  Abdominal:  General: Bowel sounds are normal. There is no distension.     Palpations: Abdomen is soft.     Tenderness: There is no abdominal tenderness. There is no right CVA tenderness or left CVA tenderness.  Musculoskeletal: Normal range of motion.  Skin:    Findings: No lesion or rash.  Neurological:     Mental Status: He is alert and oriented to person, place, and time.  Psychiatric:        Speech: Speech normal.        Behavior: Behavior normal.        Thought Content: Thought content normal.       Assessment & Plan    1. Dysuria Onset of burning, frequency and urgency the past 4-5 days. Urinalysis showed leukocytes and moderated RBC's on dipstick. No fever or gross hematuria. Will get C&S and start Septra-DS for UTI versus urethritis. No urethral discharge or back pain. May use AZO-Standard prn discomfort and drink extra  fluids. Follow up pending lab report. - POCT urinalysis dipstick - CULTURE, URINE COMPREHENSIVE - sulfamethoxazole-trimethoprim (BACTRIM DS) 800-160 MG tablet; Take 1 tablet by mouth 2 (two) times daily.  Dispense: 20 tablet; Refill: 0  2. Pyuria TNTC WBC's on microscopic exam of urine. Get C&S. - CULTURE, URINE COMPREHENSIVE     [name], PA  [facility]

## 2019-01-05 LAB — CULTURE, URINE COMPREHENSIVE

## 2019-01-10 ENCOUNTER — Telehealth: Payer: Self-pay | Admitting: Family Medicine

## 2019-01-10 MED ORDER — CIPROFLOXACIN HCL 500 MG PO TABS: 500.0000 mg | ORAL_TABLET | Freq: Two times a day (BID) | ORAL | 0 refills | Status: DC

## 2019-01-10 NOTE — Telephone Encounter (Signed)
Change Septra-DS to Cipro 500 mg BID #20 and recheck urinalysis in 10 days. May use the AZO-Standard again if having any burning discomfort.

## 2019-01-10 NOTE — Telephone Encounter (Signed)
Please advise 

## 2019-01-10 NOTE — Telephone Encounter (Signed)
Patient was advised. Rx was sent to pharmacy.  

## 2019-01-10 NOTE — Telephone Encounter (Signed)
Pt is not better from visit.  He is burning again during urination and frequently.  Please advise.  Thanks, [employer]

## 2019-01-16 ENCOUNTER — Other Ambulatory Visit: Payer: Self-pay | Admitting: Family Medicine

## 2019-01-16 DIAGNOSIS — I10 Essential (primary) hypertension: Secondary | ICD-10-CM

## 2019-01-17 ENCOUNTER — Encounter: Payer: Self-pay | Admitting: *Deleted

## 2019-01-17 ENCOUNTER — Encounter: Payer: Self-pay | Admitting: Family Medicine

## 2019-01-17 ENCOUNTER — Ambulatory Visit
Admission: RE | Admit: 2019-01-17 | Discharge: 2019-01-17 | Disposition: A | Discharge status: Home / Self Care | Payer: Medicare HMO | Source: Home / Self Care | Attending: Family Medicine | Admitting: Family Medicine

## 2019-01-17 ENCOUNTER — Ambulatory Visit (INDEPENDENT_AMBULATORY_CARE_PROVIDER_SITE_OTHER): Payer: Medicare HMO | Admitting: Family Medicine

## 2019-01-17 ENCOUNTER — Ambulatory Visit
Admission: RE | Admit: 2019-01-17 | Discharge: 2019-01-17 | Disposition: A | Discharge status: Home / Self Care | Payer: Medicare HMO | Source: Ambulatory Visit | Attending: Family Medicine | Admitting: Family Medicine

## 2019-01-17 VITALS — BP 120/70 | HR 65 | Temp 97.5°F | Resp 16 | Wt 189.0 lb

## 2019-01-17 DIAGNOSIS — R5383 Other fatigue: Secondary | ICD-10-CM | POA: Diagnosis not present

## 2019-01-17 DIAGNOSIS — R059 Cough, unspecified: Secondary | ICD-10-CM

## 2019-01-17 DIAGNOSIS — R609 Edema, unspecified: Secondary | ICD-10-CM

## 2019-01-17 DIAGNOSIS — N179 Acute kidney failure, unspecified: Secondary | ICD-10-CM | POA: Diagnosis not present

## 2019-01-17 DIAGNOSIS — R011 Cardiac murmur, unspecified: Secondary | ICD-10-CM

## 2019-01-17 DIAGNOSIS — R05 Cough: Secondary | ICD-10-CM

## 2019-01-17 NOTE — Progress Notes (Signed)
Patient: [name] Male    DOB: 07/27/1941   78 y.o.   MRN: [id number] Visit Date: 01/17/2019  Today's Provider: [name], PA   Chief Complaint  Patient presents with  . Cough   Subjective:     Cough  This is a new problem. The current episode started in the past 7 days. The problem has been gradually worsening. The cough is productive of sputum. Associated symptoms include chills, headaches, nasal congestion, rhinorrhea and shortness of breath. Pertinent negatives include no chest pain, ear congestion, ear pain, fever, heartburn, hemoptysis, myalgias, postnasal drip, rash, sore throat, sweats, weight loss or wheezing. Nothing aggravates the symptoms. Treatments tried: Mucinex. The treatment provided no relief.   Patient has had productive cough for 1 week. Patient also has symptoms of chills, headaches, nasal congestion, runny nose and mild shortness of breath. Patient has been taking otc Mucinex with no relief.   Past Medical History:  Diagnosis Date  . Diabetes mellitus without complication ([place])    type 2  . Hypertension    Past Surgical History:  Procedure Laterality Date  . CATARACT EXTRACTION, BILATERAL    . CHOLECYSTECTOMY  04/1995  . LIPOMA EXCISION  01/28/2011   Family History  Problem Relation Age of Onset  . Heart attack Brother   . Alzheimer's disease Sister   . Lung cancer Son    Allergies  Allergen Reactions  . Lisinopril Cough    Current Outpatient Medications:  .  amLODipine (NORVASC) 10 MG tablet, TAKE 1 TABLET BY MOUTH EVERY DAY, Disp: 90 tablet, Rfl: 3 .  aspirin 81 MG tablet, Take 81 mg by mouth daily. , Disp: , Rfl:  .  Blood Glucose Monitoring Suppl (ONE TOUCH ULTRA 2) w/Device KIT, OneTouch Ultra2 Meter kit, Disp: , Rfl:  .  Cinnamon 500 MG capsule, Take 500 mg by mouth daily. , Disp: , Rfl:  .  Cinnamon Bark POWD, by Does not apply route daily., Disp: , Rfl:  .  ciprofloxacin (CIPRO) 500 MG tablet, Take 1 tablet (500 mg total)  by mouth 2 (two) times daily., Disp: 20 tablet, Rfl: 0 .  losartan (COZAAR) 100 MG tablet, Take 0.5 tablets (50 mg total) by mouth daily., Disp: 45 tablet, Rfl: 3 .  ONETOUCH VERIO test strip, TEST FASTING BLOOD SUGAR ONCE DAILY, Disp: 100 each, Rfl: 4 .  sulfamethoxazole-trimethoprim (BACTRIM DS) 800-160 MG tablet, Take 1 tablet by mouth 2 (two) times daily. (Patient not taking: Reported on 01/17/2019), Disp: 20 tablet, Rfl: 0  Review of Systems  Constitutional: Positive for chills. Negative for appetite change, fever and weight loss.  HENT: Positive for congestion and rhinorrhea. Negative for ear pain, postnasal drip and sore throat.   Respiratory: Positive for cough and shortness of breath. Negative for hemoptysis, chest tightness and wheezing.   Cardiovascular: Negative for chest pain and palpitations.  Gastrointestinal: Negative for abdominal pain, heartburn, nausea and vomiting.  Musculoskeletal: Negative for myalgias.  Skin: Negative for rash.  Neurological: Positive for headaches.   Social History   Tobacco Use  . Smoking status: Former Smoker    Types: Cigarettes  . Smokeless tobacco: Former Systems developer    Types: Chew  . Tobacco comment: 78 years old for 1 year  Substance Use Topics  . Alcohol use: No    Alcohol/week: 0.0 standard drinks     Objective:   BP 120/70 (BP Location: Right Arm, Patient Position: Sitting, Cuff Size: Large)   Pulse 65  Temp (!) 97.5 F (36.4 C) (Oral)   Resp 16   Wt 189 lb (85.7 kg)   SpO2 97%   BMI 25.63 kg/m    Wt Readings from Last 3 Encounters:  01/17/19 189 lb (85.7 kg)  01/03/19 180 lb (81.6 kg)  12/06/18 176 lb 3.2 oz (79.9 kg)   Vitals:   01/17/19 1319  BP: 120/70  Pulse: 65  Resp: 16  Temp: (!) 97.5 F (36.4 C)  TempSrc: Oral  SpO2: 97%  Weight: 189 lb (85.7 kg)   Physical Exam Constitutional:      General: He is not in acute distress.    Appearance: He is well-developed.  HENT:     Head: Normocephalic and atraumatic.      Right Ear: Hearing normal.     Left Ear: Hearing normal.     Nose: Nose normal.  Eyes:     General: Lids are normal. No scleral icterus.       Right eye: No discharge.        Left eye: No discharge.     Conjunctiva/sclera: Conjunctivae normal.  Cardiovascular:     Rate and Rhythm: Normal rate.     Heart sounds: Murmur present.     Comments: Grade 3/6 systolic murmur right sternal border at 2nd ICS. Occasional skip. Pulmonary:     Effort: Pulmonary effort is normal. No respiratory distress.  Musculoskeletal:     Right lower leg: Edema present.     Left lower leg: Edema present.     Comments: Pitting edema 3+ up to knees.  Skin:    Findings: No lesion or rash.  Neurological:     Mental Status: He is alert and oriented to person, place, and time.  Psychiatric:        Speech: Speech normal.        Behavior: Behavior normal.        Thought Content: Thought content normal.       Assessment & Plan    1. Cough Onset over the past week. No fever but some rhinorrhea. Denies purulent sputum, only occasional clear mucus. General fatigue and loss of appetite. May use Mucinex-DM for cough and must eat 3 times a day with increase in fluid intake. Check CXR, CBC and CMP. May need antibiotic pending reports. - CBC with Differential/Platelet - Comprehensive metabolic panel - DG Chest 2 View  2. Peripheral edema Swelling of lower legs up to mid calf. Denies salt use. Check CXR and labs. May need diuretic if no sign of pneumonia. - CBC with Differential/Platelet - Comprehensive metabolic panel - DG Chest 2 View - EKG 12-Lead  3. Heart murmur Unchanged from past exams. No palpitations or flutters. EKG shows infrequent PVC's and undetermined anteroseptal infarct - no acute findings.  - EKG 12-Lead  4. Fatigue, unspecified type "Nothing tastes good." Has not eaten much the past week. Blood sugars have been low occasionally. Recommend he decrease Cinnamon supplements and eat regular meals  3 times a day. Must increase fluid intake. EKG shows infrequent PVC's. Will check CBC and CMP to assess hydration and nutrition level. Peripheral edema and 13 lb weight gain since January 2020. Will get CXR to assess for cardiomegaly or pneumonia. - CBC with Differential/Platelet - Comprehensive metabolic panel - EKG 90-RMBO     [name], PA  [facility]

## 2019-01-18 ENCOUNTER — Other Ambulatory Visit: Payer: Self-pay

## 2019-01-18 ENCOUNTER — Inpatient Hospital Stay
Admission: EM | Admit: 2019-01-18 | Discharge: 2019-01-19 | DRG: 684 | Disposition: A | Discharge status: Home / Self Care | Payer: Medicare HMO | Attending: Internal Medicine | Admitting: Internal Medicine

## 2019-01-18 ENCOUNTER — Inpatient Hospital Stay: Payer: Medicare HMO

## 2019-01-18 DIAGNOSIS — R339 Retention of urine, unspecified: Secondary | ICD-10-CM | POA: Diagnosis not present

## 2019-01-18 DIAGNOSIS — N132 Hydronephrosis with renal and ureteral calculous obstruction: Secondary | ICD-10-CM | POA: Diagnosis not present

## 2019-01-18 DIAGNOSIS — Z8249 Family history of ischemic heart disease and other diseases of the circulatory system: Secondary | ICD-10-CM

## 2019-01-18 DIAGNOSIS — I10 Essential (primary) hypertension: Secondary | ICD-10-CM | POA: Diagnosis present

## 2019-01-18 DIAGNOSIS — Z791 Long term (current) use of non-steroidal anti-inflammatories (NSAID): Secondary | ICD-10-CM

## 2019-01-18 DIAGNOSIS — Z820 Family history of epilepsy and other diseases of the nervous system: Secondary | ICD-10-CM

## 2019-01-18 DIAGNOSIS — N179 Acute kidney failure, unspecified: Principal | ICD-10-CM | POA: Diagnosis present

## 2019-01-18 DIAGNOSIS — R338 Other retention of urine: Secondary | ICD-10-CM

## 2019-01-18 DIAGNOSIS — E119 Type 2 diabetes mellitus without complications: Secondary | ICD-10-CM | POA: Diagnosis present

## 2019-01-18 DIAGNOSIS — N400 Enlarged prostate without lower urinary tract symptoms: Secondary | ICD-10-CM | POA: Diagnosis not present

## 2019-01-18 DIAGNOSIS — Z9049 Acquired absence of other specified parts of digestive tract: Secondary | ICD-10-CM | POA: Diagnosis not present

## 2019-01-18 DIAGNOSIS — N136 Pyonephrosis: Secondary | ICD-10-CM | POA: Diagnosis not present

## 2019-01-18 DIAGNOSIS — Z801 Family history of malignant neoplasm of trachea, bronchus and lung: Secondary | ICD-10-CM | POA: Diagnosis not present

## 2019-01-18 DIAGNOSIS — Z7982 Long term (current) use of aspirin: Secondary | ICD-10-CM

## 2019-01-18 DIAGNOSIS — N320 Bladder-neck obstruction: Secondary | ICD-10-CM | POA: Diagnosis present

## 2019-01-18 DIAGNOSIS — E875 Hyperkalemia: Secondary | ICD-10-CM | POA: Diagnosis not present

## 2019-01-18 DIAGNOSIS — Z79899 Other long term (current) drug therapy: Secondary | ICD-10-CM | POA: Diagnosis not present

## 2019-01-18 DIAGNOSIS — N200 Calculus of kidney: Secondary | ICD-10-CM | POA: Diagnosis not present

## 2019-01-18 DIAGNOSIS — N210 Calculus in bladder: Secondary | ICD-10-CM | POA: Diagnosis not present

## 2019-01-18 DIAGNOSIS — Z87891 Personal history of nicotine dependence: Secondary | ICD-10-CM | POA: Diagnosis not present

## 2019-01-18 DIAGNOSIS — N3941 Urge incontinence: Secondary | ICD-10-CM | POA: Diagnosis present

## 2019-01-18 DIAGNOSIS — N1330 Unspecified hydronephrosis: Secondary | ICD-10-CM | POA: Diagnosis not present

## 2019-01-18 LAB — COMPREHENSIVE METABOLIC PANEL
ALT: 19 IU/L (ref 0–44)
ALT: 21 U/L (ref 0–44)
AST: 14 IU/L (ref 0–40)
AST: 19 U/L (ref 15–41)
Albumin/Globulin Ratio: 1.6 (ref 1.2–2.2)
Albumin: 3.8 g/dL (ref 3.7–4.7)
Albumin: 3.7 g/dL (ref 3.5–5.0)
Alkaline Phosphatase: 89 IU/L (ref 39–117)
Alkaline Phosphatase: 69 U/L (ref 38–126)
Anion gap: 20 — ABNORMAL HIGH (ref 5–15)
BUN/Creatinine Ratio: 9 — ABNORMAL LOW (ref 10–24)
BUN: 111 mg/dL (ref 8–27)
BUN: 131 mg/dL — ABNORMAL HIGH (ref 8–23)
Bilirubin Total: 0.4 mg/dL (ref 0.0–1.2)
CHLORIDE: 100 mmol/L (ref 98–111)
CO2: 11 mmol/L — AB (ref 20–29)
CO2: 12 mmol/L — AB (ref 22–32)
Calcium: 8.9 mg/dL (ref 8.6–10.2)
Calcium: 8.9 mg/dL (ref 8.9–10.3)
Chloride: 95 mmol/L — ABNORMAL LOW (ref 96–106)
Creatinine, Ser: 12.38 mg/dL — ABNORMAL HIGH (ref 0.76–1.27)
Creatinine, Ser: 13.37 mg/dL — ABNORMAL HIGH (ref 0.61–1.24)
GFR calc Af Amer: 4 mL/min/{1.73_m2} — ABNORMAL LOW (ref >59)
GFR calc Af Amer: 4 mL/min — ABNORMAL LOW (ref >60)
GFR calc non Af Amer: 3 mL/min/{1.73_m2} — ABNORMAL LOW (ref >59)
GFR calc non Af Amer: 3 mL/min — ABNORMAL LOW (ref >60)
Globulin, Total: 2.4 g/dL (ref 1.5–4.5)
Glucose, Bld: 117 mg/dL — ABNORMAL HIGH (ref 70–99)
Glucose: 90 mg/dL (ref 65–99)
Potassium: 5.4 mmol/L — ABNORMAL HIGH (ref 3.5–5.2)
Potassium: 5.2 mmol/L — ABNORMAL HIGH (ref 3.5–5.1)
Sodium: 131 mmol/L — ABNORMAL LOW (ref 134–144)
Sodium: 132 mmol/L — ABNORMAL LOW (ref 135–145)
Total Bilirubin: 1 mg/dL (ref 0.3–1.2)
Total Protein: 6.2 g/dL (ref 6.0–8.5)
Total Protein: 6.9 g/dL (ref 6.5–8.1)

## 2019-01-18 LAB — URINALYSIS, COMPLETE (UACMP) WITH MICROSCOPIC
Bilirubin Urine: NEGATIVE
Glucose, UA: NEGATIVE mg/dL
Ketones, ur: NEGATIVE mg/dL
Nitrite: NEGATIVE
PROTEIN: NEGATIVE mg/dL
Specific Gravity, Urine: 1.005 (ref 1.005–1.030)
WBC, UA: >50 WBC/hpf — ABNORMAL HIGH (ref 0–5)
pH: 5 (ref 5.0–8.0)

## 2019-01-18 LAB — CBC WITH DIFFERENTIAL/PLATELET
Abs Immature Granulocytes: 0.04 10*3/uL (ref 0.00–0.07)
BASOS ABS: 0 10*3/uL (ref 0.0–0.2)
Basophils Absolute: 0 10*3/uL (ref 0.0–0.1)
Basophils Relative: 1 %
Basos: 1 %
EOS (ABSOLUTE): 0.1 10*3/uL (ref 0.0–0.4)
Eos: 1 %
Eosinophils Absolute: 0.1 10*3/uL (ref 0.0–0.5)
Eosinophils Relative: 1 %
HCT: 40.8 % (ref 39.0–52.0)
Hematocrit: 40.5 % (ref 37.5–51.0)
Hemoglobin: 13.8 g/dL (ref 13.0–17.7)
Hemoglobin: 13.9 g/dL (ref 13.0–17.0)
IMMATURE GRANS (ABS): 0.1 10*3/uL (ref 0.0–0.1)
IMMATURE GRANULOCYTES: 1 %
Immature Granulocytes: 1 %
Lymphocytes Absolute: 1.2 10*3/uL (ref 0.7–3.1)
Lymphocytes Relative: 16 %
Lymphs Abs: 1 10*3/uL (ref 0.7–4.0)
Lymphs: 15 %
MCH: 30 pg (ref 26.6–33.0)
MCH: 30.3 pg (ref 26.0–34.0)
MCHC: 34.1 g/dL (ref 31.5–35.7)
MCHC: 34.1 g/dL (ref 30.0–36.0)
MCV: 88 fL (ref 79–97)
MCV: 89.1 fL (ref 80.0–100.0)
Monocytes Absolute: 0.8 10*3/uL (ref 0.1–0.9)
Monocytes Absolute: 0.6 10*3/uL (ref 0.1–1.0)
Monocytes Relative: 10 %
Monocytes: 10 %
NRBC: 1 % — ABNORMAL HIGH (ref 0–0)
Neutro Abs: 4.7 10*3/uL (ref 1.7–7.7)
Neutrophils Absolute: 6 10*3/uL (ref 1.4–7.0)
Neutrophils Relative %: 71 %
Neutrophils: 72 %
Platelets: 258 10*3/uL (ref 150–450)
Platelets: 242 10*3/uL (ref 150–400)
RBC: 4.6 x10E6/uL (ref 4.14–5.80)
RBC: 4.58 MIL/uL (ref 4.22–5.81)
RDW: 12.1 % (ref 11.6–15.4)
RDW: 12.6 % (ref 11.5–15.5)
WBC: 8.1 10*3/uL (ref 3.4–10.8)
WBC: 6.4 10*3/uL (ref 4.0–10.5)
nRBC: 0 % (ref 0.0–0.2)

## 2019-01-18 MED ORDER — TAMSULOSIN HCL 0.4 MG PO CAPS
0.4000 mg | ORAL_CAPSULE | Freq: Every day | ORAL | Status: DC
  Administered 2019-01-18 – 2019-01-19 (×2): 0.4 mg via ORAL
  Filled 2019-01-18 (×2): qty 1

## 2019-01-18 MED ORDER — FINASTERIDE 5 MG PO TABS
5.0000 mg | ORAL_TABLET | Freq: Every day | ORAL | Status: DC
  Administered 2019-01-18 – 2019-01-19 (×2): 5 mg via ORAL
  Filled 2019-01-18 (×2): qty 1

## 2019-01-18 MED ORDER — AMLODIPINE BESYLATE 5 MG PO TABS
10.0000 mg | ORAL_TABLET | Freq: Every day | ORAL | Status: DC
  Administered 2019-01-18 – 2019-01-19 (×2): 10 mg via ORAL
  Filled 2019-01-18 (×2): qty 2

## 2019-01-18 MED ORDER — ACETAMINOPHEN 325 MG PO TABS: 650.0000 mg | ORAL_TABLET | Freq: Four times a day (QID) | ORAL | Status: DC | PRN

## 2019-01-18 MED ORDER — ACETAMINOPHEN 650 MG RE SUPP: 650.0000 mg | Freq: Four times a day (QID) | RECTAL | Status: DC | PRN

## 2019-01-18 MED ORDER — SODIUM CHLORIDE 0.9 % IV SOLN
1.0000 g | Freq: Once | INTRAVENOUS | Status: AC
  Administered 2019-01-18: 1 g via INTRAVENOUS
  Filled 2019-01-18: qty 10

## 2019-01-18 MED ORDER — ONDANSETRON HCL 4 MG/2ML IJ SOLN: 4.0000 mg | Freq: Four times a day (QID) | INTRAMUSCULAR | Status: DC | PRN

## 2019-01-18 MED ORDER — SODIUM CHLORIDE 0.9 % IV SOLN
INTRAVENOUS | Status: DC
  Administered 2019-01-18 – 2019-01-19 (×2): via INTRAVENOUS

## 2019-01-18 MED ORDER — ONDANSETRON HCL 4 MG PO TABS: 4.0000 mg | ORAL_TABLET | Freq: Four times a day (QID) | ORAL | Status: DC | PRN

## 2019-01-18 NOTE — ED Notes (Addendum)
764 ml of urine retained in bladder, MD made aware

## 2019-01-18 NOTE — H&P (Signed)
[facility] [place] NAME: [name]    MR#:  [id number]  DATE OF BIRTH:  1941-09-16  DATE OF ADMISSION:  01/18/2019  PRIMARY CARE PHYSICIAN: Margo Common, PA   REQUESTING/REFERRING PHYSICIAN:  Dr. Carrie Mew  CHIEF COMPLAINT:   Chief Complaint  Patient presents with  . Abnormal Lab    HISTORY OF PRESENT ILLNESS:  [name]  is a 78 y.o. male with a known history of hypertension, diet-controlled diabetes mellitus was brought in from home secondary to fatigue and nausea and abnormal labs as outpatient.  Patient is a poor historian.  He states for the past month he has been having trouble with urination.  Decreased urination noted more in the last 3 days.  He was having congestion and upper respiratory tract infection symptoms few days ago, was started on Bactrim which did not help so he was changed over to ciprofloxacin which she took for a few days and stopped taking it.  He went to see his PCP yesterday as he was still feeling weak, decreased appetite, nauseous.  Labs done showed a creatinine of 12 whereas his baseline was normal at 0.6 from last month.  He was advised to come to the ED. Patient had acute urinary retention here, Foley catheter placed and patient had almost 900 mL of blood-tinged urine.  No protein in the urine.  Creatinine is at 13.3 and potassium of 5.2  PAST MEDICAL HISTORY:   Past Medical History:  Diagnosis Date  . Diabetes mellitus without complication ([place])    type 2  . Hypertension     PAST SURGICAL HISTORY:   Past Surgical History:  Procedure Laterality Date  . CATARACT EXTRACTION, BILATERAL    . CHOLECYSTECTOMY  04/1995  . LIPOMA EXCISION  01/28/2011    SOCIAL HISTORY:   Social History   Tobacco Use  . Smoking status: Former Smoker    Types: Cigarettes  . Smokeless tobacco: Former Systems developer    Types: Chew  . Tobacco comment: 78 years old for 1 year  Substance Use Topics  . Alcohol  use: No    Alcohol/week: 0.0 standard drinks    FAMILY HISTORY:   Family History  Problem Relation Age of Onset  . Heart attack Brother   . Alzheimer's disease Sister   . Lung cancer Son     DRUG ALLERGIES:   Allergies  Allergen Reactions  . Lisinopril Cough    REVIEW OF SYSTEMS:   Review of Systems  Constitutional: Positive for malaise/fatigue. Negative for chills, fever and weight loss.       Decreased appetite  HENT: Negative for ear discharge, ear pain, hearing loss and nosebleeds.   Eyes: Negative for blurred vision, double vision and photophobia.  Respiratory: Negative for cough, hemoptysis, shortness of breath and wheezing.   Cardiovascular: Negative for chest pain, palpitations, orthopnea and leg swelling.  Gastrointestinal: Positive for nausea. Negative for abdominal pain, constipation, diarrhea, heartburn, melena and vomiting.  Genitourinary: Negative for dysuria, frequency, hematuria and urgency.  Musculoskeletal: Positive for myalgias. Negative for back pain and neck pain.  Skin: Negative for rash.  Neurological: Negative for dizziness, tingling, tremors, sensory change, speech change, focal weakness and headaches.  Endo/Heme/Allergies: Does not bruise/bleed easily.  Psychiatric/Behavioral: Negative for depression.    MEDICATIONS AT HOME:   Prior to Admission medications   Medication Sig Start Date End Date Taking? Authorizing Provider  amLODipine (NORVASC) 10 MG tablet TAKE 1 TABLET BY MOUTH  EVERY DAY 01/17/19   Chrismon, Vickki Muff, PA  aspirin 81 MG tablet Take 81 mg by mouth daily.     [provider]  Blood Glucose Monitoring Suppl (ONE TOUCH ULTRA 2) w/Device KIT OneTouch Ultra2 Meter kit    [provider]  Cinnamon 500 MG capsule Take 500 mg by mouth daily.     [provider]  Wallace Cullens POWD by Does not apply route daily.    [provider]  ciprofloxacin (CIPRO) 500 MG tablet Take 1 tablet (500 mg total) by  mouth 2 (two) times daily. 01/10/19   Chrismon, Vickki Muff, PA  losartan (COZAAR) 100 MG tablet Take 0.5 tablets (50 mg total) by mouth daily. 10/22/18   Chrismon, Vickki Muff, PA  ONETOUCH VERIO test strip TEST FASTING BLOOD SUGAR ONCE DAILY 04/27/18   Chrismon, Vickki Muff, PA  sulfamethoxazole-trimethoprim (BACTRIM DS) 800-160 MG tablet Take 1 tablet by mouth 2 (two) times daily. Patient not taking: Reported on 01/17/2019 01/03/19   Chrismon, Vickki Muff, PA      VITAL SIGNS:  Blood pressure (!) 157/80, pulse 89, temperature 97.8 F (36.6 C), temperature source Oral, resp. rate 16, height _0  (1.727 m), weight 83 kg, SpO2 96 %.  PHYSICAL EXAMINATION:   Physical Exam  GENERAL:  78 y.o.-year-old patient lying in the bed with no acute distress.  EYES: Pupils equal, round, reactive to light and accommodation. No scleral icterus. Extraocular muscles intact.  HEENT: Head atraumatic, normocephalic. Oropharynx and nasopharynx clear.  NECK:  Supple, no jugular venous distention. No thyroid enlargement, no tenderness.  LUNGS: Normal breath sounds bilaterally, no wheezing, rales,rhonchi or crepitation. No use of accessory muscles of respiration. Decreased bibasilar breath sounds CARDIOVASCULAR: S1, S2 normal. No murmurs, rubs, or gallops.  ABDOMEN: Soft, nontender, nondistended. Bowel sounds present. No organomegaly or mass. Foley in place EXTREMITIES: No  cyanosis, or clubbing. Trace bilateral pedal edema noted. NEUROLOGIC: Cranial nerves II through XII are intact. Muscle strength 5/5 in all extremities. Sensation intact. Gait not checked. Global weakness noted. PSYCHIATRIC: The patient is alert and oriented x 3.  SKIN: No obvious rash, lesion, or ulcer.   LABORATORY PANEL:   CBC Recent Labs  Lab 01/18/19 1133  WBC 6.4  HGB 13.9  HCT 40.8  PLT 242   ------------------------------------------------------------------------------------------------------------------  Chemistries  Recent Labs  Lab  01/18/19 1133  NA 132*  K 5.2*  CL 100  CO2 12*  GLUCOSE 117*  BUN 131*  CREATININE 13.37*  CALCIUM 8.9  AST 19  ALT 21  ALKPHOS 69  BILITOT 1.0   ------------------------------------------------------------------------------------------------------------------  Cardiac Enzymes No results for input(s): TROPONINI in the last 168 hours. ------------------------------------------------------------------------------------------------------------------  RADIOLOGY:  Dg Chest 2 View  Result Date: 01/18/2019 CLINICAL DATA:  Cough and weakness for 1 week, peripheral edema, hypertension, type II diabetes mellitus, former smoker EXAM: CHEST - 2 VIEW COMPARISON:  07/26/2012 FINDINGS: Mild enlargement of cardiac silhouette. Atherosclerotic calcification aorta. Mediastinal contours and pulmonary vascularity normal. Lungs clear. No pulmonary infiltrate, pleural effusion or pneumothorax. Bones demineralized. IMPRESSION: Enlargement of cardiac silhouette without infiltrate. Electronically Signed   By: Lavonia Dana M.D.   On: 01/18/2019 08:23    EKG:   Orders placed or performed in visit on 01/17/19  . EKG 12-Lead    IMPRESSION AND PLAN:   [name]  is a 78 y.o. male with a known history of hypertension, diet-controlled diabetes mellitus was brought in from home secondary to fatigue and nausea and abnormal labs as  outpatient.  1.  Acute renal failure-combination of retention and also recent use of antibiotics -Continue Foley catheter, strict input and output monitoring -No urgent indication for dialysis. -IV fluids for now. CT of the abdomen ordered. -Hold his losartan and antibiotics. -Nephrology consulted -Other causes for renal failure-SPEP, ANA, complement levels, anti-GBM antibodies ordered  2.  Hyperkalemia-could have been from Bactrim and also patient on losartan.  Hold both medications.  3.  Hypertension-continue Norvasc, hold losartan  4.  DVT prophylaxis-teds and SCDs  only.  Hold heparin due to hematuria at this time.  Patient is independent at baseline.    All the records are reviewed and case discussed with ED provider. Management plans discussed with the patient, family and they are in agreement.  CODE STATUS: Full Code  TOTAL TIME TAKING CARE OF THIS PATIENT: 52 minutes.    Gladstone Lighter M.D on 01/18/2019 at 2:23 PM  Between 7am to 6pm - Pager - [phone]  After 6pm go to [email or web address] - password [email or web address] [facility]  Office  [phone]  CC: Primary care physician; Chrismon, Vickki Muff, PA

## 2019-01-18 NOTE — ED Notes (Signed)
Report given to floor. They will come get pt

## 2019-01-18 NOTE — ED Notes (Signed)
Assumed care of patient denies pain at present time. Report given to receiving nurse. Labs drawn prior to patient leaving unit. Urine culture sent. Vss. Family at bedside. Patient off unit to receiving nurse.

## 2019-01-18 NOTE — ED Triage Notes (Signed)
Pt here with daughter, Dr. [name] sent pt for concern about ARF due to abnormal labs drawn from yesterday. Pt states he has been sick with URI and prostate infection. Denies having pain or nausea.

## 2019-01-18 NOTE — ED Notes (Signed)
Urine is now yellow.  Pt had another 1800 output from foley.

## 2019-01-18 NOTE — Consult Note (Signed)
5:06 PM   [name] 1941-10-26 [id number]  Reason for consult: Urinary retention with bilateral hydronephrosis  HPI: I was asked to see [name] in consultation from Dr. Tressia Miners for urinary retention with bilateral hydronephrosis.  He is a 78 year old male with past medical history notable for well-controlled type 2 diabetes and hypertension who was admitted with acute urinary retention, AKI with creatinine of 13, and CT scan showing bilateral hydroureteronephrosis down to the bladder.  Baseline creatinine is 0.65, last checked in January 2020.  On CT, prostate was massively enlarged at 165 g, and he had a 2 cm bladder stone.  His only prior PSA value is 4.6 at the age of 68(normal range for age).  He denies a family history of prostate cancer.  He reports a long history of difficulty voiding with weak stream, straining, urgency, urge incontinence.  This acutely worsened over the 3 days prior to admission, culminating in the inability to void.  He denies any history of flank pain or gross hematuria.  Urine culture 01/03/2019 showed no growth.  There are no aggravating or alleviating factors.  Severity is moderate to severe.  He is producing massive amounts of clear yellow urine via 16 [place] two-way Foley catheter.  PMH: Past Medical History:  Diagnosis Date  . Diabetes mellitus without complication ([place])    type 2  . Hypertension     Surgical History: Past Surgical History:  Procedure Laterality Date  . CATARACT EXTRACTION, BILATERAL    . CHOLECYSTECTOMY  04/1995  . LIPOMA EXCISION  01/28/2011    Allergies:  Allergies  Allergen Reactions  . Lisinopril Cough    Family History: Family History  Problem Relation Age of Onset  . Heart attack Brother   . Alzheimer's disease Sister   . Lung cancer Son     Social History:  reports that he has quit smoking. His smoking use included cigarettes. He has quit using smokeless tobacco.  His smokeless tobacco use included chew. He  reports that he does not drink alcohol or use drugs.  ROS: Negative aside from those stated in the HPI.  Physical Exam: BP (!) 154/90   Pulse 87   Temp 97.7 F (36.5 C) (Oral)   Resp 16   Ht 5\' 8"  (1.727 m)   Wt 83 kg   SpO2 98%   BMI 27.83 kg/m    Constitutional:  Alert and oriented, No acute distress. Cardiovascular: No clubbing, cyanosis, or edema. Respiratory: Normal respiratory effort, no increased work of breathing. GI: Abdomen is soft, nontender, nondistended, no abdominal masses GU: No CVA tenderness, uncircumcised phallus, clear yellow urine via 16 French Foley DRE: Deferred Lymph: No cervical or inguinal lymphadenopathy. Skin: No rashes, bruises or suspicious lesions. Neurologic: Grossly intact, no focal deficits, moving all 4 extremities. Psychiatric: Normal mood and affect.  Laboratory Data: Reviewed Creatinine 13.37 Potassium 5.2  Urine culture 01/04/2019 no growth Urinalysis today rare bacteria, 0-5 RBCs, greater than 50 WBCs, nitrite negative  Pertinent Imaging: I have personally reviewed the CT abdomen pelvis dated 01/18/2019.  Bilateral hydroureteronephrosis down to the bladder, with thickened wall.  Foley catheter in place with decompressed bladder, prostate measures 165 g, 2 cm bladder stone.  Assessment & Plan:   In summary, the patient is a relatively healthy 78 year old male with long history of urinary symptoms, culminating in acute urinary retention and admission to the hospital with creatinine of 13.4 and CT demonstrating bilateral hydroureteronephrosis down to a thickened bladder.  No indication of UTI  or prostatitis, suspect BPH.  -Maintain Foley, follow-up in urology clinic 1 week for voiding trial(arranged) -Continue to monitor electrolytes and manage post-obstructive diuresis -Start Flomax and finasteride -We will discuss HOLEP outlet procedure and cystolitholapaxy further at 1 week follow-up -Call if questions  [name],  [place] [address] [place], [place] [address] [phone]

## 2019-01-18 NOTE — ED Provider Notes (Addendum)
[facility] Emergency Department Provider Note  ____________________________________________  Time seen: Approximately 1:20 PM  I have reviewed the triage vital signs and the nursing notes.   HISTORY  Chief Complaint Abnormal Lab    HPI [name] is a 78 y.o. male with a history of hypertension and diabetes, recently treated for prostate infection according to the patient who was sent to the ED due to abnormal labs obtained yesterday due to having generalized weakness.Marland Kitchen   He denies any urinary retention or hesitancy.  Denies dysuria or flank pain.  No fevers or chills or sweats.  No weight changes.  Denies dizziness or syncope.  No chest pain or shortness of breath.  Reports that he is eating and drinking normally.  He is unable to identify any focal symptoms.     Past Medical History:  Diagnosis Date  . Diabetes mellitus without complication ([place])    type 2  . Hypertension      Patient Active Problem List   Diagnosis Date Noted  . Closed fracture of shaft of fibula 07/03/2016  . 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 LBP (low back pain) 08/20/2015  . 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 Esophageal reflux 08/20/2015  . Cardiac murmur 08/20/2015  . 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 Personal history of methicillin resistant Staphylococcus aureus 08/20/2015  . Essential (primary) hypertension 08/20/2015  . Muscle ache 08/20/2015  . Edema, peripheral 08/20/2015  . Asthma 08/20/2015  . Allergic rhinitis, seasonal 08/20/2015  . 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 Diabetes mellitus, type 2 ([place]) 08/20/2015     Past Surgical History:  Procedure Laterality Date  . CATARACT EXTRACTION, BILATERAL    . CHOLECYSTECTOMY  04/1995  . LIPOMA EXCISION  01/28/2011     Prior to Admission medications   Medication Sig Start Date End Date Taking?  Authorizing Provider  amLODipine (NORVASC) 10 MG tablet TAKE 1 TABLET BY MOUTH EVERY DAY 01/17/19   Chrismon, Vickki Muff, PA  aspirin 81 MG tablet Take 81 mg by mouth daily.     [provider]  Blood Glucose Monitoring Suppl (ONE TOUCH ULTRA 2) w/Device KIT OneTouch Ultra2 Meter kit    [provider]  Cinnamon 500 MG capsule Take 500 mg by mouth daily.     [provider]  Wallace Cullens POWD by Does not apply route daily.    [provider]  ciprofloxacin (CIPRO) 500 MG tablet Take 1 tablet (500 mg total) by mouth 2 (two) times daily. 01/10/19   Chrismon, Vickki Muff, PA  losartan (COZAAR) 100 MG tablet Take 0.5 tablets (50 mg total) by mouth daily. 10/22/18   Chrismon, Vickki Muff, PA  ONETOUCH VERIO test strip TEST FASTING BLOOD SUGAR ONCE DAILY 04/27/18   Chrismon, Vickki Muff, PA  sulfamethoxazole-trimethoprim (BACTRIM DS) 800-160 MG tablet Take 1 tablet by mouth 2 (two) times daily. Patient not taking: Reported on 01/17/2019 01/03/19   Chrismon, Vickki Muff, PA     Allergies Lisinopril   Family History  Problem Relation Age of Onset  . Heart attack Brother   . Alzheimer's disease Sister   . Lung cancer Son     Social History Social History   Tobacco Use  . Smoking status: Former Smoker    Types: Cigarettes  . Smokeless tobacco: Former Systems developer    Types: Chew  . Tobacco comment: 78 years old for 1 year  Substance  Use Topics  . Alcohol use: No    Alcohol/week: 0.0 standard drinks  . Drug use: No    Review of Systems  Constitutional:   No fever or chills.  ENT:   No sore throat. No rhinorrhea. Cardiovascular:   No chest pain or syncope. Respiratory:   No dyspnea or cough. Gastrointestinal:   Negative for abdominal pain, vomiting and diarrhea.  Musculoskeletal:   Negative for focal pain or swelling All other systems reviewed and are negative except as documented above in ROS and HPI.  ____________________________________________   PHYSICAL  EXAM:  VITAL SIGNS: ED Triage Vitals  Enc Vitals Group     BP 01/18/19 1122 (!) 157/63     Pulse Rate 01/18/19 1122 83     Resp 01/18/19 1122 17     Temp 01/18/19 1122 97.8 F (36.6 C)     Temp Source 01/18/19 1122 Oral     SpO2 01/18/19 1122 97 %     Weight 01/18/19 1123 183 lb (83 kg)     Height 01/18/19 1123 _0  (1.727 m)     Head Circumference --      Peak Flow --      Pain Score 01/18/19 1123 0     Pain Loc --      Pain Edu? --      Excl. in [employer]? --     Vital signs reviewed, nursing assessments reviewed.   Constitutional:   Alert and oriented. Non-toxic appearance. Eyes:   Conjunctivae are normal. EOMI. PERRL. ENT      Head:   Normocephalic and atraumatic.      Nose:   No congestion/rhinnorhea.       Mouth/Throat:   MMM, no pharyngeal erythema. No peritonsillar mass.       Neck:   No meningismus. Full ROM. Hematological/Lymphatic/Immunilogical:   No cervical lymphadenopathy. Cardiovascular:   RRR. Symmetric bilateral radial and DP pulses.  No murmurs. Cap refill less than 2 seconds. Respiratory:   Normal respiratory effort without tachypnea/retractions. Breath sounds are clear and equal bilaterally. No wheezes/rales/rhonchi. Gastrointestinal:   Soft with suprapubic tenderness and palpable bladder up to near the umbilicus.. Non distended. There is no CVA tenderness.  No rebound, rigidity, or guarding. Musculoskeletal:   Normal range of motion in all extremities. No joint effusions.  No lower extremity tenderness.  No edema. Neurologic:   Normal speech and language.  Motor grossly intact. No acute focal neurologic deficits are appreciated.  Skin:    Skin is warm, dry and intact. No rash noted.  No petechiae, purpura, or bullae.  ____________________________________________    LABS (pertinent positives/negatives) (all labs ordered are listed, but only abnormal results are displayed) Labs Reviewed  COMPREHENSIVE METABOLIC PANEL - Abnormal; Notable for the following  components:      Result Value   Sodium 132 (*)    Potassium 5.2 (*)    CO2 12 (*)    Glucose, Bld 117 (*)    BUN 131 (*)    Creatinine, Ser 13.37 (*)    GFR calc non Af Amer 3 (*)    GFR calc Af Amer 4 (*)    Anion gap 20 (*)    All other components within normal limits  URINALYSIS, COMPLETE (UACMP) WITH MICROSCOPIC - Abnormal; Notable for the following components:   Color, Urine STRAW (*)    APPearance CLEAR (*)    Hgb urine dipstick SMALL (*)    Leukocytes,Ua LARGE (*)    WBC, UA >50 (*)  Bacteria, UA RARE (*)    All other components within normal limits  URINE CULTURE  CBC WITH DIFFERENTIAL/PLATELET   ____________________________________________   EKG    ____________________________________________    RADIOLOGY  Dg Chest 2 View  Result Date: 01/18/2019 CLINICAL DATA:  Cough and weakness for 1 week, peripheral edema, hypertension, type II diabetes mellitus, former smoker EXAM: CHEST - 2 VIEW COMPARISON:  07/26/2012 FINDINGS: Mild enlargement of cardiac silhouette. Atherosclerotic calcification aorta. Mediastinal contours and pulmonary vascularity normal. Lungs clear. No pulmonary infiltrate, pleural effusion or pneumothorax. Bones demineralized. IMPRESSION: Enlargement of cardiac silhouette without infiltrate. Electronically Signed   By: Lavonia Dana M.D.   On: 01/18/2019 08:23    ____________________________________________   PROCEDURES Procedures  ____________________________________________    CLINICAL IMPRESSION / ASSESSMENT AND PLAN / ED COURSE  Pertinent labs & imaging results that were available during my care of the patient were reviewed by me and considered in my medical decision making (see chart for details).    Patient presents with acute renal failure with a creatinine of 13 compared to a baseline of normal.  Urinalysis shows large amount of white blood cells and leukocyte esterase.  Rare bacteria on urine microscopy.  Bladder scan shows greater  than 700 mL's in the bladder, exam consistent with urinary retention causing obstructive renal failure.  Will place Foley catheter, start ceftriaxone, send urine culture, plan to admit for lab monitoring given likely large volume diuresis and renal dysfunction..  Clinical Course as of Jan 18 1419  Tue Jan 18, 2019  1420 Discussed with Dr. Candiss Norse who will evaluate.   [PS]    Clinical Course User Index [PS] Carrie Mew, MD     ____________________________________________   FINAL CLINICAL IMPRESSION(S) / ED DIAGNOSES    Final diagnoses:  Acute renal failure, unspecified acute renal failure type [facility])  Urinary retention     ED Discharge Orders    None      Portions of this note were generated with dragon dictation software. Dictation errors may occur despite best attempts at proofreading.   Carrie Mew, MD 01/18/19 [place], MD 01/18/19 1420

## 2019-01-18 NOTE — ED Notes (Signed)
Pt urnine that has drained from foley was dark cranberry in color.  Foley had been clamped after 800cc urine return when it was placed.  Unclamped the foley at this time and additional urine returned, still cranberry in color. Pt is in no distress and states that he feels better since his bladder has been emptied.

## 2019-01-18 NOTE — ED Notes (Signed)
Call to 2C 

## 2019-01-18 NOTE — Consult Note (Signed)
Date: 01/18/2019                  Patient Name:  [name]  MRN: [id number]  DOB: 1941-03-25  Age / Sex: 78 y.o., male         PCP: Margo Common, PA                 Service Requesting Consult: IM/ Gladstone Lighter, MD                 Reason for Consult: ARF            History of Present Illness: Patient is a 78 y.o. male with medical problems of type 2 diabetes, hypertension, who was admitted to [facility] on 01/18/2019 for evaluation of abnormal lab results.  In the emergency room patient was noted to have critically elevated creatinine.  He was also noted to have urinary retention.  A Foley catheter was placed which resulted in massive diuresis.  CT of the abdomen without contrast shows moderate to severe bilateral hydroureteronephrosis.  Indeterminate cause.  Bladder wall was markedly thickened secondary to bladder outlet obstruction caused by significantly enlarged prostate gland.  1.8 cm right bladder stone was noted. Nephrology consult was requested for evaluation Patient reports he was being treated with antibiotics for the past 2 to 3 weeks for urinary tract infection.  He was not benefiting from the treatment.  He gained 13 pounds and noticed some leg edema even though he was not eating much. his appetite was low.  Medications: Outpatient medications: Medications Prior to Admission  Medication Sig Dispense Refill Last Dose  . amLODipine (NORVASC) 10 MG tablet TAKE 1 TABLET BY MOUTH EVERY DAY 90 tablet 3 Taking  . aspirin 81 MG tablet Take 81 mg by mouth daily.    Taking  . Blood Glucose Monitoring Suppl (ONE TOUCH ULTRA 2) w/Device KIT OneTouch Ultra2 Meter kit   Taking  . Cinnamon 500 MG capsule Take 500 mg by mouth daily.    Taking  . Cinnamon Bark POWD by Does not apply route daily.   Taking  . ciprofloxacin (CIPRO) 500 MG tablet Take 1 tablet (500 mg total) by mouth 2 (two) times daily. 20 tablet 0 Taking  . losartan (COZAAR) 100 MG tablet Take 0.5 tablets (50 mg  total) by mouth daily. 45 tablet 3 Taking  . ONETOUCH VERIO test strip TEST FASTING BLOOD SUGAR ONCE DAILY 100 each 4 Taking  . sulfamethoxazole-trimethoprim (BACTRIM DS) 800-160 MG tablet Take 1 tablet by mouth 2 (two) times daily. (Patient not taking: Reported on 01/17/2019) 20 tablet 0 Not Taking    Current medications: Current Facility-Administered Medications  Medication Dose Route Frequency Provider Last Rate Last Dose  . 0.9 %  sodium chloride infusion   Intravenous Continuous Gladstone Lighter, MD 100 mL/hr at 01/18/19 1657    . acetaminophen (TYLENOL) tablet 650 mg  650 mg Oral Q6H PRN Gladstone Lighter, MD       Or  . acetaminophen (TYLENOL) suppository 650 mg  650 mg Rectal Q6H PRN Gladstone Lighter, MD      . amLODipine (NORVASC) tablet 10 mg  10 mg Oral Daily Gladstone Lighter, MD   10 mg at 01/18/19 1558  . finasteride (PROSCAR) tablet 5 mg  5 mg Oral Daily Gladstone Lighter, MD      . ondansetron [facility]) tablet 4 mg  4 mg Oral Q6H PRN Gladstone Lighter, MD       Or  .  ondansetron (ZOFRAN) injection 4 mg  4 mg Intravenous Q6H PRN Gladstone Lighter, MD      . tamsulosin (FLOMAX) capsule 0.4 mg  0.4 mg Oral Daily Gladstone Lighter, MD          Allergies: Allergies  Allergen Reactions  . Lisinopril Cough      Past Medical History: Past Medical History:  Diagnosis Date  . Diabetes mellitus without complication ([place])    type 2  . Hypertension      Past Surgical History: Past Surgical History:  Procedure Laterality Date  . CATARACT EXTRACTION, BILATERAL    . CHOLECYSTECTOMY  04/1995  . LIPOMA EXCISION  01/28/2011     Family History: Family History  Problem Relation Age of Onset  . Heart attack Brother   . Alzheimer's disease Sister   . Lung cancer Son      Social History: Social History   Socioeconomic History  . Marital status: Married    Spouse name: Not on file  . Number of children: 5  . Years of education: Not on file  . Highest  education level: 10th grade  Occupational History  . Occupation: retired  Scientific laboratory technician  . Financial resource strain: Not hard at all  . Food insecurity:    Worry: Never true    Inability: Never true  . Transportation needs:    Medical: No    Non-medical: No  Tobacco Use  . Smoking status: Former Smoker    Types: Cigarettes  . Smokeless tobacco: Former Systems developer    Types: Chew  . Tobacco comment: 78 years old for 1 year  Substance and Sexual Activity  . Alcohol use: No    Alcohol/week: 0.0 standard drinks  . Drug use: No  . Sexual activity: Not on file  Lifestyle  . Physical activity:    Days per week: 0 days    Minutes per session: 0 min  . Stress: Not at all  Relationships  . Social connections:    Talks on phone: Patient refused    Gets together: Patient refused    Attends religious service: Patient refused    Active member of club or organization: Patient refused    Attends meetings of clubs or organizations: Patient refused    Relationship status: Patient refused  . Intimate partner violence:    Fear of current or ex partner: No    Emotionally abused: No    Physically abused: No    Forced sexual activity: No  Other Topics Concern  . Not on file  Social History Narrative  . Not on file     Review of Systems: Gen: No fevers or chills, gained 13 pounds HEENT: No vision or hearing problems CV: No chest pain or shortness of breath but has significant lower extremity edema Resp: Some cough.  No hemoptysis GI: Appetite has been poor lately GU : Difficulty voiding, dribbling MS: No complaints Derm:  No complaints Psych: No complaints Heme: No complaints Neuro: No complaints Endocrine.  No complaints  Vital Signs: Blood pressure (!) 154/90, pulse 87, temperature 97.7 F (36.5 C), temperature source Oral, resp. rate 16, height 5' 8" (1.727 m), weight 83 kg, SpO2 98 %.   Intake/Output Summary (Last 24 hours) at 01/18/2019 1747 Last data filed at 01/18/2019  1657 Gross per 24 hour  Intake 190.81 ml  Output 5300 ml  Net -5109.19 ml    Weight trends: Filed Weights   01/18/19 1123  Weight: 83 kg    Physical Exam: General:  Elderly gentleman, laying in the bed  HEENT  anicteric, moist oral mucous membranes  Neck:  Supple  Lungs:  Clear to auscultation bilaterally  Heart::  Regular rhythm, no rub or gallop  Abdomen:  Soft, nontender  Extremities:  No peripheral edema  Neurologic:  Alert, oriented  Skin:  No acute rashes  Foley:  Catheter in place       Lab results: Basic Metabolic Panel: Recent Labs  Lab 01/17/19 1437 01/18/19 1133  NA 131* 132*  K 5.4* 5.2*  CL 95* 100  CO2 11* 12*  GLUCOSE 90 117*  BUN 111* 131*  CREATININE 12.38* 13.37*  CALCIUM 8.9 8.9    Liver Function Tests: Recent Labs  Lab 01/18/19 1133  AST 19  ALT 21  ALKPHOS 69  BILITOT 1.0  PROT 6.9  ALBUMIN 3.7   No results for input(s): LIPASE, AMYLASE in the last 168 hours. No results for input(s): AMMONIA in the last 168 hours.  CBC: Recent Labs  Lab 01/17/19 1437 01/18/19 1133  WBC 8.1 6.4  NEUTROABS 6.0 4.7  HGB 13.8 13.9  HCT 40.5 40.8  MCV 88 89.1  PLT 258 242    Cardiac Enzymes: No results for input(s): CKTOTAL, TROPONINI in the last 168 hours.  BNP: Invalid input(s): POCBNP  CBG: No results for input(s): GLUCAP in the last 168 hours.  Microbiology: Recent Results (from the past 720 hour(s))  CULTURE, URINE COMPREHENSIVE     Status: None   Collection Time: 01/03/19  3:28 PM  Result Value Ref Range Status   Urine Culture, Comprehensive Final report  Final   Organism ID, Bacteria Comment  Final    Comment: No growth in 36 - 48 hours.     Coagulation Studies: No results for input(s): LABPROT, INR in the last 72 hours.  Urinalysis: Recent Labs    01/18/19 1133  COLORURINE STRAW*  LABSPEC 1.005  PHURINE 5.0  GLUCOSEU NEGATIVE  HGBUR SMALL*  BILIRUBINUR NEGATIVE  KETONESUR NEGATIVE  PROTEINUR NEGATIVE   NITRITE NEGATIVE  LEUKOCYTESUR LARGE*        Imaging: Ct Abdomen Pelvis Wo Contrast  Result Date: 01/18/2019 CLINICAL DATA:  78 year old male with acute renal failure. Recent URI and prostate infection. Prior cholecystectomy. Initial encounter. EXAM: CT ABDOMEN AND PELVIS WITHOUT CONTRAST TECHNIQUE: Multidetector CT imaging of the abdomen and pelvis was performed following the standard protocol without IV contrast. COMPARISON:  No comparison CT. FINDINGS: Lower chest: Small pleural effusions with minimal basilar atelectasis. Heart top-normal size. Aortic valve calcification. Hepatobiliary: Taking into account limitation by non contrast imaging, no worrisome hepatic lesion. Post cholecystectomy. Pancreas: Taking into account limitation by non contrast imaging, no worrisome pancreatic mass. Spleen: Taking into account limitation by non contrast imaging, no splenic mass or enlargement. Adrenals/Urinary Tract: Bilateral moderate to marked hydroureteronephrosis. 1.8 cm right bladder calculus near the level of the right ureteral vesicle junction. Prominent bladder wall thickening. Foley catheter in place with partially decompressed urinary bladder. Diffuse bladder wall thickening. Significantly enlarged prostate gland elevating bladder base. Several tiny nonobstructing left renal calculi. No ureteral obstructing stone noted. Taking into account limitation by non contrast imaging, no worrisome renal mass. Hyperplasia adrenal glands. Stomach/Bowel: Colonic diverticula. Third spacing of fluid slightly limits evaluation however, no discrete findings of diverticulitis. No inflammation surrounds the appendix. Circumferential mild thickening distal esophagus may be related to under distension/reflux. Limited for evaluating for mass at this level. No gastric abnormality noted. Duodenal diverticulum felt to be present rather than ulcer. Vascular/Lymphatic: Atherosclerotic  changes aorta and aortic branch vessels. No  abdominal aortic aneurysm. Scattered normal size lymph nodes. Reproductive: Markedly enlarged prostate gland elevating the bladder base. Other: No free air or bowel containing hernia. Third spacing of fluid. Fatty atrophy most notable gluteal muscles. Musculoskeletal: Degenerative changes lumbar spine most notable L4-5 and L5-S1. IMPRESSION: 1. Moderate-to-marked bilateral hydroureteronephrosis. Cause of this is indeterminate. No ureteral obstructing stone is noted. Markedly thickened bladder wall secondary to bladder outlet obstruction caused by significantly enlarged prostate gland may contribute to the hydroureteronephrosis. There is a 1.8 cm right bladder calculi near the right renal vesical junction. Limited with respect to detecting bladder mass given the degree of contraction. Foley catheter is in place. 2. Tiny nonobstructing left renal calculi. 3. Third spacing of fluid.  Small pleural effusions. 4. Taking into account third spacing of fluid, no primary bowel inflammatory process is noted. 5. Circumferential thickening distal esophagus may be related to under distension/reflux. Limited for evaluating for mass at this level. 6.  Aortic Atherosclerosis (ICD10-I70.0). 7. Post cholecystectomy. Electronically Signed   By: Genia Del M.D.   On: 01/18/2019 15:17   Dg Chest 2 View  Result Date: 01/18/2019 CLINICAL DATA:  Cough and weakness for 1 week, peripheral edema, hypertension, type II diabetes mellitus, former smoker EXAM: CHEST - 2 VIEW COMPARISON:  07/26/2012 FINDINGS: Mild enlargement of cardiac silhouette. Atherosclerotic calcification aorta. Mediastinal contours and pulmonary vascularity normal. Lungs clear. No pulmonary infiltrate, pleural effusion or pneumothorax. Bones demineralized. IMPRESSION: Enlargement of cardiac silhouette without infiltrate. Electronically Signed   By: Lavonia Dana M.D.   On: 01/18/2019 08:23      Assessment & Plan: Pt is a 78 y.o. Caucasian  male with  diet-controlled diabetes, hypertension, was admitted on 01/18/2019 with acute renal failure from acute bladder outlet obstruction  1.  Acute kidney injury 2.  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  Mild hyperkalemia 4.  Bladder stone  Patient appears to have severe acute renal failure secondary to bladder outlet obstruction.  A Foley catheter has been placed and patient has had massive diuresis.  Continue to monitor electrolytes and renal function periodically.  Baseline creatinine is 0.65 from December 07, 2018.  Urinalysis today shows small hemoglobin, 0-5 RBCs, greater than 50 WBCs.  Urine cultures pending. Potassium is expected to correct with diuresis Patient has been evaluated by urologist and outpatient follow-up was recommended. No acute indication for dialysis at present     LOS: 0 Brolin [facility] 2/18/20205:47 PM  [place], [place]  Note: This note was prepared with Dragon dictation. Any transcription errors are unintentional

## 2019-01-19 ENCOUNTER — Telehealth: Payer: Self-pay | Admitting: Urology

## 2019-01-19 LAB — PROTEIN ELECTROPHORESIS, SERUM
A/G Ratio: 1.2 (ref 0.7–1.7)
ALPHA-2-GLOBULIN: 1 g/dL (ref 0.4–1.0)
Albumin ELP: 3.6 g/dL (ref 2.9–4.4)
Alpha-1-Globulin: 0.4 g/dL (ref 0.0–0.4)
Beta Globulin: 0.8 g/dL (ref 0.7–1.3)
Gamma Globulin: 0.9 g/dL (ref 0.4–1.8)
Globulin, Total: 3.1 g/dL (ref 2.2–3.9)
Total Protein ELP: 6.7 g/dL (ref 6.0–8.5)

## 2019-01-19 LAB — C3 COMPLEMENT: C3 Complement: 110 mg/dL (ref 82–167)

## 2019-01-19 LAB — CBC
HEMATOCRIT: 38.5 % — AB (ref 39.0–52.0)
Hemoglobin: 13.1 g/dL (ref 13.0–17.0)
MCH: 30.1 pg (ref 26.0–34.0)
MCHC: 34 g/dL (ref 30.0–36.0)
MCV: 88.5 fL (ref 80.0–100.0)
Platelets: 261 10*3/uL (ref 150–400)
RBC: 4.35 MIL/uL (ref 4.22–5.81)
RDW: 12.8 % (ref 11.5–15.5)
WBC: 6.2 10*3/uL (ref 4.0–10.5)
nRBC: 0 % (ref 0.0–0.2)

## 2019-01-19 LAB — ANA COMPREHENSIVE PANEL

## 2019-01-19 LAB — BASIC METABOLIC PANEL
ANION GAP: 8 (ref 5–15)
BUN: 58 mg/dL — ABNORMAL HIGH (ref 8–23)
CO2: 21 mmol/L — ABNORMAL LOW (ref 22–32)
Calcium: 9.2 mg/dL (ref 8.9–10.3)
Chloride: 113 mmol/L — ABNORMAL HIGH (ref 98–111)
Creatinine, Ser: 2.76 mg/dL — ABNORMAL HIGH (ref 0.61–1.24)
GFR calc Af Amer: 24 mL/min — ABNORMAL LOW (ref >60)
GFR calc non Af Amer: 21 mL/min — ABNORMAL LOW (ref >60)
GLUCOSE: 137 mg/dL — AB (ref 70–99)
Potassium: 4.4 mmol/L (ref 3.5–5.1)
Sodium: 142 mmol/L (ref 135–145)

## 2019-01-19 LAB — C4 COMPLEMENT: Complement C4, Body Fluid: 34 mg/dL (ref 14–44)

## 2019-01-19 LAB — URINE CULTURE

## 2019-01-19 LAB — GLOMERULAR BASEMENT MEMBRANE ANTIBODIES: GBM AB: 3 U (ref 0–20)

## 2019-01-19 MED ORDER — CIPROFLOXACIN HCL 500 MG PO TABS: 500.0000 mg | ORAL_TABLET | Freq: Two times a day (BID) | ORAL | 0 refills | Status: DC

## 2019-01-19 MED ORDER — FINASTERIDE 5 MG PO TABS: 5.0000 mg | ORAL_TABLET | Freq: Every day | ORAL | 0 refills | Status: DC

## 2019-01-19 MED ORDER — CIPROFLOXACIN HCL 250 MG PO TABS: 250.0000 mg | ORAL_TABLET | Freq: Two times a day (BID) | ORAL | 0 refills | Status: AC

## 2019-01-19 MED ORDER — TAMSULOSIN HCL 0.4 MG PO CAPS: 0.4000 mg | ORAL_CAPSULE | Freq: Every day | ORAL | 0 refills | Status: DC

## 2019-01-19 NOTE — Progress Notes (Signed)
Pt will be DC home with foley catheter. Pt will follow up with urology outpt,

## 2019-01-19 NOTE — Discharge Instructions (Signed)
Acute Urinary Retention, Male  Acute urinary retention is a condition in which a person is unable to pass urine. This can last for a short time or for a long time. If left untreated, it can result in kidney damage or other serious complications. What are the causes? This condition may be caused by:  Obstruction or narrowing of the tube that drains the bladder (urethra). This may be caused by surgery or problems with nearby organs, such as the prostate gland, which can press or squeeze the urethra.  Problems with the nerves in the bladder. These can be caused by diseases, such as multiple sclerosis, or by spinal cord injuries.  Certain medicines.  Tumors in the area of the pelvis, bladder, or urethra.  Diabetes.  Degenerative cognitive conditions such as delirium or dementia.  Bladder or urinary tract infection.  Constipation.  Blood in the urine (hematuria).  Injury to the bladder or urethra.  Psychological (psychogenic) conditions. Someone may hold his urine due to trauma or because he does not want to use the bathroom. What increases the risk? This condition is more likely to develop in older men. As men age, their prostate may become larger and may start pressing or squeezing on the bladder or the urethra. What are the signs or symptoms? Symptoms of this condition include:  Trouble urinating.  Pain in the lower abdomen. Symptoms usually come on slowly over a long period of time. How is this diagnosed? This condition is diagnosed based on a physical exam and a medical history. You may also have other tests, including:  An ultrasound of the bladder or kidneys or both.  Blood tests.  A urine analysis.  Additional tests may be needed such as an MRI, kidney, or bladder function tests. How is this treated? Treatment for this condition may include:  Medicines.  Placing a thin, sterile tube (catheter) into the bladder to drain urine out of the body. This is called an  indwelling urinary catheter. After being inserted, the catheter is held in place with a small balloon that is filled with sterile water. Urine drains from the catheter into a collection bag outside of the body.  Behavioral therapy.  Treatment for any underlying conditions.  If needed, you may be treated in the hospital for kidney function problems or to manage other complications. Follow these instructions at home:  Take over-the-counter and prescription medicines only as told by your health care provider. Avoid certain medicines, such as decongestants, antihistamines, and some prescription medicines. Do not take any medicine unless your health care provider has approved.  If you were given an indwelling urinary catheter, take care of it as told by your health care provider.  Drink enough fluid to keep your urine clear or pale yellow.  If you were prescribed an antibiotic, take it as told by your health care provider. Do not stop taking the antibiotic even if you start to feel better.  Do not use any products that contain nicotine or tobacco, such as cigarettes and e-cigarettes. If you need help quitting, ask your health care provider.  Monitor any changes in your symptoms. Tell your health care provider about any changes.  If instructed, monitor your blood pressure at home. Report changes as told by your health care provider.  Keep all follow-up visits as told by your health care provider. This is important. Contact a health care provider if:  You have uncomfortable bladder contractions that you cannot control (spasms) or you leak urine with the spasms.   Get help right away if:  You have chills or fever.  You have blood in your urine.  You have a catheter and: ? Your catheter stops draining urine. ? Your catheter falls out. Summary  Acute urinary retention is a condition in which a person is unable to pass urine. If left untreated, it can result in kidney damage or other serious  complications.  The cause of this condition may include an enlarged prostate. As men age, their prostate gland may become larger and may start pressing or squeezing on the bladder or the urethra.  Treatment for this condition may include medicines and placement of an indwelling urinary catheter.  Monitor any changes in your symptoms. Tell your health care provider about any changes. This information is not intended to replace advice given to you by your health care provider. Make sure you discuss any questions you have with your health care provider. Document Released: 02/23/2001 Document Revised: 12/19/2016 Document Reviewed: 12/19/2016 Elsevier Interactive Patient Education  2019 [employer]vier Inc.  

## 2019-01-19 NOTE — Telephone Encounter (Signed)
-----   Message from Billey Co, MD sent at 01/18/2019  5:15 PM EST ----- Regarding: follow up Please schedule follow-up with me in 7 to 10 days for hospital follow-up.  Okay to overbook.  Nickolas Madrid, MD 01/18/2019

## 2019-01-19 NOTE — Discharge Summary (Signed)
[name], is a 79 y.o. male  DOB [birth year]  MRN [id number].  Admission date:  01/18/2019  Admitting Physician  John Lighter, MD  Discharge Date:  01/19/2019   Primary MD  Chrismon, Vickki Muff, PA  Recommendations for primary care physician for things to follow:   Follow-up with urology Dr. Jeb Levering on February 27. Follow with PCP in 1 week.   Admission Diagnosis  Urinary retention [R33.9] ARF (acute renal failure) (HCC) [N17.9] Acute renal failure, unspecified acute renal failure type ([place]) [N17.9]   Discharge Diagnosis  Urinary retention [R33.9] ARF (acute renal failure) ([place]) [N17.9] Acute renal failure, unspecified acute renal failure type ([place]) [N17.9]    Active Problems:   ARF (acute renal failure) (HCC)      Past Medical History:  Diagnosis Date  . Diabetes mellitus without complication ([place])    type 2  . Hypertension     Past Surgical History:  Procedure Laterality Date  . CATARACT EXTRACTION, BILATERAL    . CHOLECYSTECTOMY  04/1995  . LIPOMA EXCISION  01/28/2011       History of present illness and  Hospital Course:     Kindly see H&P for history of present illness and admission details, please review complete Labs, Consult reports and Test reports for all details in brief  HPI  from the history and physical done on the day of admission   78 year old male patient with past medical history of diabetes mellitus type 2, essential hypertension comes in because of acute urine retention and found to have bilateral hydronephrosis, acute renal failure with creatinine of 13.  Patient is admitted to medical service for acute renal failure with bilateral hydronephrosis.  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  Started on Flomax, finasteride, urology recommends discharging him with Foley catheter and follow-up in urology clinic in 1 week after discharge for voiding trials this has been arranged.  Discussed with patient also.  CT abdomen on admission showed bilateral hydronephrosis down to the bladder, decompressed after Foley catheter placement.  #2. acute renal failure secondary to urine retention, improved after Foley catheter placement, creatinine decreased from 13.37-2.76.  Patient is eager to go home.  Discharge him home with Foley catheter.    Cystitis: Patient UA showed rare bacteria, more than 50 UBC.  Will give Cipro for 3 days.  Patient recently was given Cipro, Bactrim by urology. Discharge Condition: Stable   Follow UP  Follow-up Information    Billey Co, MD. Go on 01/27/2019.   Specialty:  Urology Why:  Dr. Diamantina Providence, Thursday, February 27 at 1:30 p.m.  [phone] Contact information: [place] [address] [phone]        [place], [place]. Schedule an appointment as soon as possible for a visit in 1 week(s).   Specialty:  Family Medicine Contact information: [address] [place] [address] [phone]             Discharge Instructions  and  Discharge Medications      Allergies as of 01/19/2019      Reactions   Lisinopril Cough      Medication List    STOP taking these medications   sulfamethoxazole-trimethoprim 800-160 MG tablet Commonly known as:  BACTRIM DS  TAKE these medications   amLODipine 10 MG tablet Commonly known as:  NORVASC TAKE 1 TABLET BY MOUTH EVERY DAY   aspirin 81 MG tablet Take 81 mg by mouth daily.   Cinnamon 500 MG capsule Take 500 mg by mouth daily.   Cinnamon Bark Powd by Does not apply route daily.    ciprofloxacin 250 MG tablet Commonly known as:  CIPRO Take 1 tablet (250 mg total) by mouth 2 (two) times daily for 5 days. What changed:    medication strength  how much to take   finasteride 5 MG tablet Commonly known as:  PROSCAR Take 1 tablet (5 mg total) by mouth daily. Start taking on:  January 20, 2019   losartan 100 MG tablet Commonly known as:  COZAAR Take 0.5 tablets (50 mg total) by mouth daily.   ONE TOUCH ULTRA 2 w/Device Kit OneTouch Ultra2 Meter kit   ONETOUCH VERIO test strip Generic drug:  glucose blood TEST FASTING BLOOD SUGAR ONCE DAILY   tamsulosin 0.4 MG Caps capsule Commonly known as:  FLOMAX Take 1 capsule (0.4 mg total) by mouth daily. Start taking on:  January 20, 2019         Diet and Activity recommendation: See Discharge Instructions above   Consults obtained -urology, nephrology   Major procedures and Radiology Reports - PLEASE review detailed and final reports for all details, in brief -     Ct Abdomen Pelvis Wo Contrast  Result Date: 01/18/2019 CLINICAL DATA:  78 year old male with acute renal failure. Recent URI and prostate infection. Prior cholecystectomy. Initial encounter. EXAM: CT ABDOMEN AND PELVIS WITHOUT CONTRAST TECHNIQUE: Multidetector CT imaging of the abdomen and pelvis was performed following the standard protocol without IV contrast. COMPARISON:  No comparison CT. FINDINGS: Lower chest: Small pleural effusions with minimal basilar atelectasis. Heart top-normal size. Aortic valve calcification. Hepatobiliary: Taking into account limitation by non contrast imaging, no worrisome hepatic lesion. Post cholecystectomy. Pancreas: Taking into account limitation by non contrast imaging, no worrisome pancreatic mass. Spleen: Taking into account limitation by non contrast imaging, no splenic mass or enlargement. Adrenals/Urinary Tract: Bilateral moderate to marked hydroureteronephrosis. 1.8 cm right bladder calculus near the level  of the right ureteral vesicle junction. Prominent bladder wall thickening. Foley catheter in place with partially decompressed urinary bladder. Diffuse bladder wall thickening. Significantly enlarged prostate gland elevating bladder base. Several tiny nonobstructing left renal calculi. No ureteral obstructing stone noted. Taking into account limitation by non contrast imaging, no worrisome renal mass. Hyperplasia adrenal glands. Stomach/Bowel: Colonic diverticula. Third spacing of fluid slightly limits evaluation however, no discrete findings of diverticulitis. No inflammation surrounds the appendix. Circumferential mild thickening distal esophagus may be related to under distension/reflux. Limited for evaluating for mass at this level. No gastric abnormality noted. Duodenal diverticulum felt to be present rather than ulcer. Vascular/Lymphatic: Atherosclerotic changes aorta and aortic branch vessels. No abdominal aortic aneurysm. Scattered normal size lymph nodes. Reproductive: Markedly enlarged prostate gland elevating the bladder base. Other: No free air or bowel containing hernia. Third spacing of fluid. Fatty atrophy most notable gluteal muscles. Musculoskeletal: Degenerative changes lumbar spine most notable L4-5 and L5-S1. IMPRESSION: 1. Moderate-to-marked bilateral hydroureteronephrosis. Cause of this is indeterminate. No ureteral obstructing stone is noted. Markedly thickened bladder wall secondary to bladder outlet obstruction caused by significantly enlarged prostate gland may contribute to the hydroureteronephrosis. There is a 1.8 cm right bladder calculi near the right renal vesical junction. Limited with respect to detecting bladder mass given the degree of  contraction. Foley catheter is in place. 2. Tiny nonobstructing left renal calculi. 3. Third spacing of fluid.  Small pleural effusions. 4. Taking into account third spacing of fluid, no primary bowel inflammatory process is noted. 5. Circumferential  thickening distal esophagus may be related to under distension/reflux. Limited for evaluating for mass at this level. 6.  Aortic Atherosclerosis (ICD10-I70.0). 7. Post cholecystectomy. Electronically Signed   By: Genia Del M.D.   On: 01/18/2019 15:17   Dg Chest 2 View  Result Date: 01/18/2019 CLINICAL DATA:  Cough and weakness for 1 week, peripheral edema, hypertension, type II diabetes mellitus, former smoker EXAM: CHEST - 2 VIEW COMPARISON:  07/26/2012 FINDINGS: Mild enlargement of cardiac silhouette. Atherosclerotic calcification aorta. Mediastinal contours and pulmonary vascularity normal. Lungs clear. No pulmonary infiltrate, pleural effusion or pneumothorax. Bones demineralized. IMPRESSION: Enlargement of cardiac silhouette without infiltrate. Electronically Signed   By: Lavonia Dana M.D.   On: 01/18/2019 08:23    Micro Results     No results found for this or any previous visit (from the past 240 hour(s)).     Today   Subjective:   [name] today has no headache,no chest abdominal pain,no new weakness tingling or numbness, feels much better wants to go home today.  Objective:   Blood pressure (!) 151/82, pulse 87, temperature 97.7 F (36.5 C), temperature source Oral, resp. rate 19, height _0  (1.727 m), weight 83 kg, SpO2 98 %.   Intake/Output Summary (Last 24 hours) at 01/19/2019 1001 Last data filed at 01/19/2019 0759 Gross per 24 hour  Intake 1319.21 ml  Output 10700 ml  Net -9380.79 ml    Exam Awake Alert, Oriented x 3, No new F.N deficits, Normal affect [place].AT,PERRAL Supple Neck,No JVD, No cervical lymphadenopathy appriciated.  Symmetrical Chest wall movement, Good air movement bilaterally, CTAB RRR,No Gallops,Rubs or new Murmurs, No Parasternal Heave +ve B.Sounds, Abd Soft, Non tender, No organomegaly appriciated, No rebound -guarding or rigidity. No Cyanosis, Clubbing or edema, No new Rash or bruise  Data Review   CBC w Diff:  Lab Results   Component Value Date   WBC 6.2 01/19/2019   HGB 13.1 01/19/2019   HGB 13.8 01/17/2019   HCT 38.5 (L) 01/19/2019   HCT 40.5 01/17/2019   PLT 261 01/19/2019   PLT 258 01/17/2019   LYMPHOPCT 16 01/18/2019   MONOPCT 10 01/18/2019   EOSPCT 1 01/18/2019   BASOPCT 1 01/18/2019    CMP:  Lab Results  Component Value Date   NA 142 01/19/2019   NA 131 (L) 01/17/2019   K 4.4 01/19/2019   CL 113 (H) 01/19/2019   CO2 21 (L) 01/19/2019   BUN 58 (H) 01/19/2019   BUN 111 (HH) 01/17/2019   CREATININE 2.76 (H) 01/19/2019   PROT 6.9 01/18/2019   PROT 6.2 01/17/2019   ALBUMIN 3.7 01/18/2019   ALBUMIN 3.8 01/17/2019   BILITOT 1.0 01/18/2019   BILITOT 0.4 01/17/2019   ALKPHOS 69 01/18/2019   AST 19 01/18/2019   ALT 21 01/18/2019  .   Total Time in preparing paper work, data evaluation and todays exam - 35 minutes  Epifanio Lesches M.D on 01/19/2019 at 10:01 AM    Note: This dictation was prepared with Dragon dictation along with smaller phrase technology. Any transcriptional errors that result from this process are unintentional.

## 2019-01-19 NOTE — Telephone Encounter (Signed)
App made and given to [facility] on 2-C  Michelle

## 2019-01-19 NOTE — Progress Notes (Signed)
Jaymison T Loya  A and O x 4. VSS. Pt tolerating diet well. No complaints of pain or nausea. IV removed intact, prescriptions given. Pt voiced understanding of discharge instructions with no further questions. Pt discharged via wheelchair with axillary.    Allergies as of 01/19/2019      Reactions   Lisinopril Cough      Medication List    STOP taking these medications   sulfamethoxazole-trimethoprim 800-160 MG tablet Commonly known as:  BACTRIM DS     TAKE these medications   amLODipine 10 MG tablet Commonly known as:  NORVASC TAKE 1 TABLET BY MOUTH EVERY DAY   aspirin 81 MG tablet Take 81 mg by mouth daily.   Cinnamon 500 MG capsule Take 500 mg by mouth daily.   Cinnamon Bark Powd by Does not apply route daily.   ciprofloxacin 250 MG tablet Commonly known as:  CIPRO Take 1 tablet (250 mg total) by mouth 2 (two) times daily for 5 days. What changed:    medication strength  how much to take   finasteride 5 MG tablet Commonly known as:  PROSCAR Take 1 tablet (5 mg total) by mouth daily. Start taking on:  January 20, 2019   losartan 100 MG tablet Commonly known as:  COZAAR Take 0.5 tablets (50 mg total) by mouth daily.   ONE TOUCH ULTRA 2 w/Device Kit OneTouch Ultra2 Meter kit   ONETOUCH VERIO test strip Generic drug:  glucose blood TEST FASTING BLOOD SUGAR ONCE DAILY   tamsulosin 0.4 MG Caps capsule Commonly known as:  FLOMAX Take 1 capsule (0.4 mg total) by mouth daily. Start taking on:  January 20, 2019       Vitals:   01/19/19 0754 01/19/19 0830  BP: (!) 151/82   Pulse: 80 87  Resp:  19  Temp:  97.7 F (36.5 C)  SpO2:  98%    Juan G Rodriguez Ornelas  

## 2019-01-19 NOTE — Progress Notes (Signed)
[name] from the lab reported a " delta failure" with tthe labs drawn this am.  She explained someone would be up shortly to redraw all labs.

## 2019-01-20 ENCOUNTER — Telehealth: Payer: Self-pay

## 2019-01-20 NOTE — Telephone Encounter (Signed)
HFU scheduled for 01/28/19 @ 3:00 PM with Simona Huh Chrismon.

## 2019-01-20 NOTE — Telephone Encounter (Signed)
Acknowledged. Thanks

## 2019-01-20 NOTE — Telephone Encounter (Signed)
Transition Care Management Follow-up Telephone Call  Date of discharge and from where: [facility] on 01/19/19.  How have you been since you were released from the hospital? Doing fine, swelling has gone down. Declines any s/s or issues with the catheter.  Any questions or concerns? No   Items Reviewed:  Did the pt receive and understand the discharge instructions provided? Yes   Medications obtained and verified? Yes   Any new allergies since your discharge? No   Dietary orders reviewed? N/A  Do you have support at home? Yes   Other (ie: DME, Home Health, etc) N/A  Functional Questionnaire: (I = Independent and D = Dependent)  Bathing/Dressing- I   Meal Prep- I  Eating- I  Maintaining continence- Currently has a catheter in place.  Transferring/Ambulation- Uses a cane.  Managing Meds- I   Follow up appointments reviewed:    PCP Hospital f/u appt confirmed? Yes  Scheduled to see [name] on 01/28/19 @ 3:00 PM.  [facility] f/u appt confirmed? Yes    Are transportation arrangements needed? No   If their condition worsens, is the pt aware to call  their PCP or go to the ED? Yes  Was the patient provided with contact information for the PCP's office or ED? Yes  Was the pt encouraged to call back with questions or concerns? Yes

## 2019-01-24 NOTE — Progress Notes (Signed)
Patient: [name] Male    DOB: 12/15/1940   78 y.o.   MRN: [id number] Visit Date: 01/28/2019  Today's Provider: [name], PA   Chief Complaint  Patient presents with  . Hospitalization Follow-up  . Pre-op Exam   Subjective:     HPI    Follow up Hospitalization  Patient was admitted to [facility] on 01/18/2019 and discharged on 01/19/2019. He was treated for ARF with initial creatinine 13.37. CT scan showed hydroureteronephrosis, obstructing enlarged 164 gm prostate and large 2 cm bladder stone. Started on Flomax and Finasteride and discharged with a Foley catheter in place. Telephone follow up was done on 01/20/2019 He reports good compliance with treatment. He reports this condition is about the same.  -------------------------------------------------------------------   Patient states he feels somewhat better however he still feels very fatigued. Scheduled for Holep-Laser enucleation of prostate on 02-11-19 and had Foley catheter put back in yesterday by urologist (Dr. Diamantina Providence)  Past Medical History:  Diagnosis Date  . Diabetes mellitus without complication ([place])    type 2  . Hypertension    Past Surgical History:  Procedure Laterality Date  . CATARACT EXTRACTION, BILATERAL    . CHOLECYSTECTOMY  04/1995  . LIPOMA EXCISION  01/28/2011   Family History  Problem Relation Age of Onset  . Heart attack Brother   . Alzheimer's disease Sister   . Lung cancer Son    Allergies  Allergen Reactions  . Lisinopril Cough    Current Outpatient Medications:  .  amLODipine (NORVASC) 10 MG tablet, TAKE 1 TABLET BY MOUTH EVERY DAY, Disp: 90 tablet, Rfl: 3 .  aspirin 81 MG tablet, Take 81 mg by mouth daily. , Disp: , Rfl:  .  Blood Glucose Monitoring Suppl (ONE TOUCH ULTRA 2) w/Device KIT, OneTouch Ultra2 Meter kit, Disp: , Rfl:  .  Cinnamon 500 MG capsule, Take 500 mg by mouth daily. , Disp: , Rfl:  .  Cinnamon Bark POWD, by Does not apply route daily., Disp: , Rfl:    .  finasteride (PROSCAR) 5 MG tablet, Take 1 tablet (5 mg total) by mouth daily., Disp: 30 tablet, Rfl: 0 .  losartan (COZAAR) 100 MG tablet, Take 0.5 tablets (50 mg total) by mouth daily., Disp: 45 tablet, Rfl: 3 .  ONETOUCH VERIO test strip, TEST FASTING BLOOD SUGAR ONCE DAILY, Disp: 100 each, Rfl: 4 .  sulfamethoxazole-trimethoprim (BACTRIM DS,SEPTRA DS) 800-160 MG tablet, Take 1 tablet by mouth every 12 (twelve) hours., Disp: 6 tablet, Rfl: 0 .  tamsulosin (FLOMAX) 0.4 MG CAPS capsule, Take 1 capsule (0.4 mg total) by mouth daily., Disp: 30 capsule, Rfl: 0  Review of Systems  Constitutional: Negative for appetite change, chills and fever.  Respiratory: Negative for chest tightness, shortness of breath and wheezing.   Cardiovascular: Negative for chest pain and palpitations.  Gastrointestinal: Negative for abdominal pain, nausea and vomiting.   Social History   Tobacco Use  . Smoking status: Former Smoker    Types: Cigarettes  . Smokeless tobacco: Former Systems developer    Types: Chew  . Tobacco comment: 78 years old for 1 year  Substance Use Topics  . Alcohol use: No    Alcohol/week: 0.0 standard drinks     Objective:   BP 120/74 (BP Location: Right Arm, Patient Position: Sitting, Cuff Size: Large)   Pulse 79   Temp 98.3 F (36.8 C) (Oral)   Resp 16   Wt 169 lb (76.7 kg)   SpO2  98%   BMI 25.70 kg/m  Vitals:   01/28/19 1501  BP: 120/74  Pulse: 79  Resp: 16  Temp: 98.3 F (36.8 C)  TempSrc: Oral  SpO2: 98%  Weight: 169 lb (76.7 kg)   Physical Exam Constitutional:      General: He is not in acute distress.    Appearance: He is well-developed.  HENT:     Head: Normocephalic and atraumatic.     Right Ear: Hearing and tympanic membrane normal.     Left Ear: Hearing and tympanic membrane normal.     Nose: Nose normal.     Mouth/Throat:     Pharynx: Oropharynx is clear.  Eyes:     General: Lids are normal. No scleral icterus.       Right eye: No discharge.        Left  eye: No discharge.     Conjunctiva/sclera: Conjunctivae normal.  Cardiovascular:     Rate and Rhythm: Normal rate and regular rhythm.     Heart sounds: Normal heart sounds.  Pulmonary:     Effort: Pulmonary effort is normal. No respiratory distress.     Breath sounds: Normal breath sounds.  Abdominal:     General: Bowel sounds are normal.     Palpations: Abdomen is soft.  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 Musculoskeletal:     Comments: Walks with a cane due to pain in left lower back and hips from degenerative disease. No pain sitting at rest. No radiation of pain into legs. SLR's 90 degrees without pain. 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  Lymphadenopathy:     Cervical: No cervical adenopathy.  Skin:    Findings: No lesion or rash.  Neurological:     Mental Status: He is alert and oriented to person, place, and time.  Psychiatric:        Speech: Speech normal.        Behavior: Behavior normal.        Thought Content: Thought content normal.       Assessment & Plan    1. Pre-op evaluation General health stable and low to moderate risk for HOLEP for BPH and urinary retention. EKG on 01-17-19 showed infrequent PVC without acute changes and CXR that day did not show any infiltrate/pneumonia. Some peripheral edema that day has been cleared since ER visit for acute renal failure secondary to BPH with urinary obstruction. No edema, heart murmur, JVD or dyspnea today. Medically cleared for urinary procedure/surgery.  2. BPH with urinary obstruction CT scan on 01-18-19 demonstrated a massive 164 gm prostate with a 2 cm bladder stone causing acute urinary retention. Urologist place a Foley catheter and 900 mL of blood-tinged urine was removed in the ER. Feeling improved but a little weak today. No peripheral edema remains. Proceed with BPH surgery per Dr.Sninsky.   3. Acute renal failure,  unspecified acute renal failure type [facility]) Hospitalized 01-18-19 and discharged 01-19-19 for acute renal failure with creatinine 13+ and bilateral hydronephrosis secondary to massive BPH. Continues to wear Foley catheter to a drainage bag. Creatinine down to 2.76 by discharge and dropped to 0.77 by lab recheck on 01-27-19 when rechecked by Dr. Diamantina Providence (urologist). Feeling improved except for a little weakness today. Proceed with surgical reduction in prostate mass obstructing outflow.  Recent Results (from the past 2160 hour(s))  POCT glycosylated hemoglobin (Hb A1C)     Status: Abnormal  Collection Time: 12/06/18 10:52 AM  Result Value Ref Range   Hemoglobin A1C 5.0 4.0 - 5.6 %   HbA1c POC (<> result, manual entry) 5.0 4.0 - 5.6 %   HbA1c, POC (prediabetic range) 5.0 (A) 5.7 - 6.4 %   HbA1c, POC (controlled diabetic range) 5.0 0.0 - 7.0 %  CBC with Differential/Platelet     Status: None   Collection Time: 12/07/18 10:26 AM  Result Value Ref Range   WBC 6.7 3.4 - 10.8 x10E3/uL   RBC 5.04 4.14 - 5.80 x10E6/uL   Hemoglobin 15.6 13.0 - 17.7 g/dL   Hematocrit 46.6 37.5 - 51.0 %   MCV 93 79 - 97 fL   MCH 31.0 26.6 - 33.0 pg   MCHC 33.5 31.5 - 35.7 g/dL   RDW 12.0 11.6 - 15.4 %    Comment:               **Please note reference interval change**   Platelets 206 150 - 450 x10E3/uL   Neutrophils 63 Not Estab. %   Lymphs 25 Not Estab. %   Monocytes 8 Not Estab. %   Eos 2 Not Estab. %   Basos 1 Not Estab. %   Neutrophils Absolute 4.3 1.4 - 7.0 x10E3/uL   Lymphocytes Absolute 1.7 0.7 - 3.1 x10E3/uL   Monocytes Absolute 0.5 0.1 - 0.9 x10E3/uL   EOS (ABSOLUTE) 0.1 0.0 - 0.4 x10E3/uL   Basophils Absolute 0.1 0.0 - 0.2 x10E3/uL   Immature Granulocytes 1 Not Estab. %   Immature Grans (Abs) 0.0 0.0 - 0.1 x10E3/uL  Comprehensive metabolic panel     Status: Abnormal   Collection Time: 12/07/18 10:26 AM  Result Value Ref Range   Glucose 98 65 - 99 mg/dL   BUN 20 8 - 27 mg/dL   Creatinine, Ser 0.65  (L) 0.76 - 1.27 mg/dL   GFR calc non Af Amer 93 >59 mL/min/1.73   GFR calc Af Amer 107 >59 mL/min/1.73   BUN/Creatinine Ratio 31 (H) 10 - 24   Sodium 143 134 - 144 mmol/L   Potassium 3.9 3.5 - 5.2 mmol/L   Chloride 103 96 - 106 mmol/L   CO2 27 20 - 29 mmol/L   Calcium 9.3 8.6 - 10.2 mg/dL   Total Protein 6.2 6.0 - 8.5 g/dL   Albumin 4.2 3.5 - 4.8 g/dL    Comment:     **Effective December 20, 2018 Albumin reference**       interval will be changing to:              Age                Male          Male           0 -  7 days        3.6 - 4.9      3.6 - 4.9           8 - 30 days        3.4 - 4.7      3.4 - 4.7           1 -  6 month       3.7 - 4.8      3.7 - 4.8    7 months -  2 years       3.9 - 5.0      3.9 - 5.0  3 -  5 years       4.0 - 5.0      4.0 - 5.0           6 - 12 years       4.1 - 5.0      4.0 - 5.0          13 - 30 years       4.1 - 5.2      3.9 - 5.0          31 - 50 years       4.0 - 5.0      3.8 - 4.8          51 - 60 years       3.8 - 4.9      3.8 - 4.9          61 - 70 years       3.8 - 4.8      3.8 - 4.8          71 - 80 years       3.7 - 4.7      3.7 - 4.7          81 - 89 years       3.6 - 4.6      3.6 - 4.6              >89 years       3.5 - 4.6      3.5 - 4.6    Globulin, Total 2.0 1.5 - 4.5 g/dL   Albumin/Globulin Ratio 2.1 1.2 - 2.2   Bilirubin Total 0.7 0.0 - 1.2 mg/dL   Alkaline Phosphatase 76 39 - 117 IU/L   AST 14 0 - 40 IU/L   ALT 18 0 - 44 IU/L  Lipid panel     Status: Abnormal   Collection Time: 12/07/18 10:26 AM  Result Value Ref Range   Cholesterol, Total 102 100 - 199 mg/dL   Triglycerides 62 0 - 149 mg/dL   HDL 39 (L) >39 mg/dL   VLDL Cholesterol Cal 12 5 - 40 mg/dL   LDL Calculated 51 0 - 99 mg/dL   Chol/HDL Ratio 2.6 0.0 - 5.0 ratio    Comment:                                   T. Chol/HDL Ratio                                             Men  Women                               1/2 Avg.Risk  3.4    3.3                                    Avg.Risk  5.0    4.4                                2X Avg.Risk  9.6    7.1  3X Avg.Risk 23.4   11.0   HM DIABETES EYE EXAM     Status: None   Collection Time: 12/23/18 12:00 AM  Result Value Ref Range   HM Diabetic Eye Exam No Retinopathy No Retinopathy  POCT urinalysis dipstick     Status: Abnormal   Collection Time: 01/03/19  3:27 PM  Result Value Ref Range   Color, UA Yellow    Clarity, UA Clear    Glucose, UA Negative Negative   Bilirubin, UA Neg    Ketones, UA Tracw    Spec Grav, UA 1.010 1.010 - 1.025   Blood, UA Moderate    pH, UA 6.0 5.0 - 8.0   Protein, UA Negative Negative   Urobilinogen, UA 0.2 0.2 or 1.0 E.U./dL   Nitrite, UA Neg    Leukocytes, UA Moderate (2+) (A) Negative   Appearance Normal    Odor Normal   CULTURE, URINE COMPREHENSIVE     Status: None   Collection Time: 01/03/19  3:28 PM  Result Value Ref Range   Urine Culture, Comprehensive Final report    Organism ID, Bacteria Comment     Comment: No growth in 36 - 48 hours.  CBC with Differential/Platelet     Status: Abnormal   Collection Time: 01/17/19  2:37 PM  Result Value Ref Range   WBC 8.1 3.4 - 10.8 x10E3/uL   RBC 4.60 4.14 - 5.80 x10E6/uL   Hemoglobin 13.8 13.0 - 17.7 g/dL   Hematocrit 40.5 37.5 - 51.0 %   MCV 88 79 - 97 fL   MCH 30.0 26.6 - 33.0 pg   MCHC 34.1 31.5 - 35.7 g/dL   RDW 12.1 11.6 - 15.4 %   Platelets 258 150 - 450 x10E3/uL   Neutrophils 72 Not Estab. %   Lymphs 15 Not Estab. %   Monocytes 10 Not Estab. %   Eos 1 Not Estab. %   Basos 1 Not Estab. %   Neutrophils Absolute 6.0 1.4 - 7.0 x10E3/uL   Lymphocytes Absolute 1.2 0.7 - 3.1 x10E3/uL   Monocytes Absolute 0.8 0.1 - 0.9 x10E3/uL   EOS (ABSOLUTE) 0.1 0.0 - 0.4 x10E3/uL   Basophils Absolute 0.0 0.0 - 0.2 x10E3/uL   Immature Granulocytes 1 Not Estab. %   Immature Grans (Abs) 0.1 0.0 - 0.1 x10E3/uL   NRBC 1 (H) 0 - 0 %  Comprehensive metabolic panel     Status: Abnormal    Collection Time: 01/17/19  2:37 PM  Result Value Ref Range   Glucose 90 65 - 99 mg/dL   BUN 111 (HH) 8 - 27 mg/dL   Creatinine, Ser 12.38 (H) 0.76 - 1.27 mg/dL    Comment: **Verified by repeat analysis**   GFR calc non Af Amer 3 (L) >59 mL/min/1.73   GFR calc Af Amer 4 (L) >59 mL/min/1.73   BUN/Creatinine Ratio 9 (L) 10 - 24   Sodium 131 (L) 134 - 144 mmol/L   Potassium 5.4 (H) 3.5 - 5.2 mmol/L   Chloride 95 (L) 96 - 106 mmol/L   CO2 11 (L) 20 - 29 mmol/L    Comment: **Verified by repeat analysis**   Calcium 8.9 8.6 - 10.2 mg/dL   Total Protein 6.2 6.0 - 8.5 g/dL   Albumin 3.8 3.7 - 4.7 g/dL    Comment:               **Please note reference interval change**   Globulin, Total 2.4 1.5 - 4.5 g/dL   Albumin/Globulin Ratio  1.6 1.2 - 2.2   Bilirubin Total 0.4 0.0 - 1.2 mg/dL   Alkaline Phosphatase 89 39 - 117 IU/L   AST 14 0 - 40 IU/L   ALT 19 0 - 44 IU/L  CBC with Differential     Status: None   Collection Time: 01/18/19 11:33 AM  Result Value Ref Range   WBC 6.4 4.0 - 10.5 K/uL   RBC 4.58 4.22 - 5.81 MIL/uL   Hemoglobin 13.9 13.0 - 17.0 g/dL   HCT 40.8 39.0 - 52.0 %   MCV 89.1 80.0 - 100.0 fL   MCH 30.3 26.0 - 34.0 pg   MCHC 34.1 30.0 - 36.0 g/dL   RDW 12.6 11.5 - 15.5 %   Platelets 242 150 - 400 K/uL   nRBC 0.0 0.0 - 0.2 %   Neutrophils Relative % 71 %   Neutro Abs 4.7 1.7 - 7.7 K/uL   Lymphocytes Relative 16 %   Lymphs Abs 1.0 0.7 - 4.0 K/uL   Monocytes Relative 10 %   Monocytes Absolute 0.6 0.1 - 1.0 K/uL   Eosinophils Relative 1 %   Eosinophils Absolute 0.1 0.0 - 0.5 K/uL   Basophils Relative 1 %   Basophils Absolute 0.0 0.0 - 0.1 K/uL   Immature Granulocytes 1 %   Abs Immature Granulocytes 0.04 0.00 - 0.07 K/uL    Comment: Performed at [facility], [address] [place], [place] [address]  Comprehensive metabolic panel     Status: Abnormal   Collection Time: 01/18/19 11:33 AM  Result Value Ref Range   Sodium 132 (L) 135 - 145 mmol/L   Potassium 5.2  (H) 3.5 - 5.1 mmol/L   Chloride 100 98 - 111 mmol/L   CO2 12 (L) 22 - 32 mmol/L   Glucose, Bld 117 (H) 70 - 99 mg/dL   BUN 131 (H) 8 - 23 mg/dL    Comment: RESULT CONFIRMED BY MANUAL DILUTION...[place]   Creatinine, Ser 13.37 (H) 0.61 - 1.24 mg/dL   Calcium 8.9 8.9 - 10.3 mg/dL   Total Protein 6.9 6.5 - 8.1 g/dL   Albumin 3.7 3.5 - 5.0 g/dL   AST 19 15 - 41 U/L   ALT 21 0 - 44 U/L   Alkaline Phosphatase 69 38 - 126 U/L   Total Bilirubin 1.0 0.3 - 1.2 mg/dL   GFR calc non Af Amer 3 (L) >60 mL/min   GFR calc Af Amer 4 (L) >60 mL/min   Anion gap 20 (H) 5 - 15    Comment: Performed at [facility], [address] [place], [place] [address]  Urinalysis, Complete w Microscopic     Status: Abnormal   Collection Time: 01/18/19 11:33 AM  Result Value Ref Range   Color, Urine STRAW (A) YELLOW   APPearance CLEAR (A) CLEAR   Specific Gravity, Urine 1.005 1.005 - 1.030   pH 5.0 5.0 - 8.0   Glucose, UA NEGATIVE NEGATIVE mg/dL   Hgb urine dipstick SMALL (A) NEGATIVE   Bilirubin Urine NEGATIVE NEGATIVE   Ketones, ur NEGATIVE NEGATIVE mg/dL   Protein, ur NEGATIVE NEGATIVE mg/dL   Nitrite NEGATIVE NEGATIVE   Leukocytes,Ua LARGE (A) NEGATIVE   RBC / HPF 0-5 0 - 5 RBC/hpf   WBC, UA >50 (H) 0 - 5 WBC/hpf   Bacteria, UA RARE (A) NONE SEEN   Squamous Epithelial / LPF 0-5 0 - 5   Mucus PRESENT     Comment: Performed at [facility], [address], [place], [place] [address]  Urine Culture     Status: Abnormal   Collection Time: 01/18/19 11:33 AM  Result Value Ref Range   Specimen Description      URINE, RANDOM Performed at [facility], [address] [place], [place] [address]    Special Requests      NONE Performed at [facility], [address] [place], [place] [address]    Culture MULTIPLE SPECIES PRESENT, SUGGEST RECOLLECTION (A)    Report Status 01/19/2019 FINAL   Glomerular basement membrane antibodies     Status: None   Collection Time:  01/18/19  3:24 PM  Result Value Ref Range   GBM Ab 3 0 - 20 units    Comment: (NOTE)                   Negative                   0 - 20                   Weak Positive             21 - 30                   Moderate to Strong Positive   >30 Performed At: [facility] [place], [place] [id number] Rush Farmer MD YC:[id number]   ANA Comprehensive Panel     Status: None   Collection Time: 01/18/19  3:24 PM  Result Value Ref Range   ds DNA Ab <1 0 - 9 IU/mL    Comment: (NOTE)                                   Negative      <5                                   Equivocal  5 - 9                                   Positive      >9    Ribonucleic Protein 0.2 0.0 - 0.9 AI   ENA SM Ab Ser-aCnc <0.2 0.0 - 0.9 AI   Scleroderma (Scl-70) (ENA) Antibody, IgG <0.2 0.0 - 0.9 AI   SSA (Ro) (ENA) Antibody, IgG <0.2 0.0 - 0.9 AI   SSB (La) (ENA) Antibody, IgG <0.2 0.0 - 0.9 AI   Chromatin Ab SerPl-aCnc <0.2 0.0 - 0.9 AI   Anti JO-1 <0.2 0.0 - 0.9 AI   Centromere Ab Screen <0.2 0.0 - 0.9 AI   See below: Comment     Comment: (NOTE) Autoantibody                       Disease Association ------------------------------------------------------------                        Condition                  Frequency ---------------------   ------------------------   --------- Antinuclear Antibody,    SLE, mixed connective Direct (ANA-D)           tissue diseases ---------------------   ------------------------   ---------  dsDNA                    SLE                        40 - 60% ---------------------   ------------------------   --------- Chromatin                Drug induced SLE                90%                         SLE                        48 - 97% ---------------------   ------------------------   --------- SSA (Ro)                 SLE                        25 - 35%                         Sjogren's Syndrome         40 - 70%                         Neonatal Lupus                  100% ---------------------   ------------------------   --------- SSB (La)                 SLE                              10%                         Sjogren's Syndrome              30% ---------------------   -----------------------    --------- Sm (anti-Smith)          SLE                        15 - 30% ---------------------   -----------------------    --------- RNP                      Mixed Connective Tissue                         Disease                         95% (U1 nRNP,                SLE                        30 - 50% anti-ribonucleoprotein)  Polymyositis and/or                         Dermatomyositis                 20% ---------------------   ------------------------   --------- Scl-70 (antiDNA  Scleroderma (diffuse)      20 - 35% topoisomerase)           [place]rest                           13% ---------------------   ------------------------   --------- Jo-1                     Polymyositis and/or                         Dermatomyositis            20 - 40% ---------------------   ------------------------   --------- Centromere B             Scleroderma -  Crest                         variant                         80% Performed At: [facility] [place], [place] [id number] Rush Farmer MD MV:[id number]   Protein electrophoresis, serum     Status: None   Collection Time: 01/18/19  3:24 PM  Result Value Ref Range   Total Protein ELP 6.7 6.0 - 8.5 g/dL    Comment: Specimen received hemolyzed. Clinical correlation indicated.   Albumin ELP 3.6 2.9 - 4.4 g/dL   Alpha-1-Globulin 0.4 0.0 - 0.4 g/dL   Alpha-2-Globulin 1.0 0.4 - 1.0 g/dL   Beta Globulin 0.8 0.7 - 1.3 g/dL   Gamma Globulin 0.9 0.4 - 1.8 g/dL   M-Spike, % Not Observed Not Observed g/dL   SPE Interp. Comment     Comment: (NOTE) The SPE pattern appears essentially unremarkable. Evidence of monoclonal protein is not apparent. Performed At: [facility] [place], [place] [id number] Rush Farmer MD UU:[id number]    Comment Comment     Comment: (NOTE) Protein electrophoresis scan will follow via computer, mail, or courier delivery.    Globulin, Total 3.1 2.2 - 3.9 g/dL   A/G Ratio 1.2 0.7 - 1.7  C3 complement     Status: None   Collection Time: 01/18/19  3:24 PM  Result Value Ref Range   C3 Complement 110 82 - 167 mg/dL    Comment: (NOTE) Performed At: [facility] [place], [place] [id number] Rush Farmer MD OV:[id number]   C4 complement     Status: None   Collection Time: 01/18/19  3:24 PM  Result Value Ref Range   Complement C4, Body Fluid 34 14 - 44 mg/dL    Comment: (NOTE) Performed At: [facility] [place], [place] [id number] Rush Farmer MD [id number]   Basic metabolic panel     Status: Abnormal   Collection Time: 01/19/19  4:34 AM  Result Value Ref Range   Sodium 142 135 - 145 mmol/L   Potassium 4.4 3.5 - 5.1 mmol/L   Chloride 113 (H) 98 - 111 mmol/L   CO2 21 (L) 22 - 32 mmol/L   Glucose, Bld 137 (H) 70 - 99 mg/dL   BUN 58 (H) 8 - 23 mg/dL   Creatinine, Ser 2.76 (H) 0.61 - 1.24 mg/dL    Comment: RESULTS VERIFIED BY REPEAT TESTING JJB   Calcium 9.2 8.9 - 10.3 mg/dL   GFR calc non Af  Amer 21 (L) >60 mL/min   GFR calc Af Amer 24 (L) >60 mL/min   Anion gap 8 5 - 15    Comment: Performed at [facility], [address] [place], [place] [address]  CBC     Status: Abnormal   Collection Time: 01/19/19  4:34 AM  Result Value Ref Range   WBC 6.2 4.0 - 10.5 K/uL   RBC 4.35 4.22 - 5.81 MIL/uL   Hemoglobin 13.1 13.0 - 17.0 g/dL   HCT 38.5 (L) 39.0 - 52.0 %   MCV 88.5 80.0 - 100.0 fL   MCH 30.1 26.0 - 34.0 pg   MCHC 34.0 30.0 - 36.0 g/dL   RDW 12.8 11.5 - 15.5 %   Platelets 261 150 - 400 K/uL   nRBC 0.0 0.0 - 0.2 %    Comment: Performed at [facility], [address] [place], [place] [address]  Basic metabolic panel      Status: Abnormal   Collection Time: 01/27/19  3:42 PM  Result Value Ref Range   Glucose 101 (H) 65 - 99 mg/dL   BUN 24 8 - 27 mg/dL   Creatinine, Ser 0.77 0.76 - 1.27 mg/dL   GFR calc non Af Amer 86 >59 mL/min/1.73   GFR calc Af Amer 100 >59 mL/min/1.73   BUN/Creatinine Ratio 31 (H) 10 - 24   Sodium 138 134 - 144 mmol/L   Potassium 4.0 3.5 - 5.2 mmol/L   Chloride 101 96 - 106 mmol/L   CO2 23 20 - 29 mmol/L   Calcium 9.0 8.6 - 10.2 mg/dL  BLADDER SCAN AMB NON-IMAGING     Status: None   Collection Time: 01/28/19 10:05 AM  Result Value Ref Range   Scan Result 480m   Urinalysis, Routine w reflex microscopic     Status: Abnormal   Collection Time: 02/07/19 10:20 AM  Result Value Ref Range   Color, Urine YELLOW (A) YELLOW   APPearance CLOUDY (A) CLEAR   Specific Gravity, Urine 1.015 1.005 - 1.030   pH 6.0 5.0 - 8.0   Glucose, UA NEGATIVE NEGATIVE mg/dL   Hgb urine dipstick MODERATE (A) NEGATIVE   Bilirubin Urine NEGATIVE NEGATIVE   Ketones, ur NEGATIVE NEGATIVE mg/dL   Protein, ur 100 (A) NEGATIVE mg/dL   Nitrite NEGATIVE NEGATIVE   Leukocytes,Ua LARGE (A) NEGATIVE   RBC / HPF >50 (H) 0 - 5 RBC/hpf   WBC, UA >50 (H) 0 - 5 WBC/hpf   Bacteria, UA NONE SEEN NONE SEEN   Squamous Epithelial / LPF NONE SEEN 0 - 5   WBC Clumps PRESENT    Mucus PRESENT     Comment: [facility] [address], [place] [address] Urine culture     Status: Abnormal (Preliminary result)   Collection Time: 02/07/19 10:20 AM  Result Value Ref Range   Specimen Description      URINE, CATHETERIZED [facility]formed at [facility] [address], [place] [address]   Special Requests      NONE [facility]formed at [facility] [address] [place] [address]   Culture (A)     >=100,000 COLONIES/mL STAPHYLOCOCCUS AUREUS SUSCEPTIBILITIES TO FOLLOW [facility]formed at [facility], [address], [place] [address]   Report  Status PENDING     4. 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 Has used Aleve prn once a day for discomfort. Has continued to be active "fishing".  5. History of diabetes mellitus Well controlled without hypoglycemic episodes being reported at last follow up on 12-06-18. Hgb A1C was 5.0% and no medications. Diabetes control maintained by diet and exercise alone. Last ophthalmology exam on 12-23-18 was normal without retinopathy. No peripheral neuropathy on detailed foot exam on 08-06-18. Medically stable for surgery.     [name], PA  [facility]

## 2019-01-27 ENCOUNTER — Ambulatory Visit (INDEPENDENT_AMBULATORY_CARE_PROVIDER_SITE_OTHER): Payer: Medicare HMO | Admitting: Urology

## 2019-01-27 ENCOUNTER — Encounter: Payer: Self-pay | Admitting: Urology

## 2019-01-27 VITALS — BP 135/80 | HR 86 | Ht 68.0 in | Wt 183.0 lb

## 2019-01-27 DIAGNOSIS — N401 Enlarged prostate with lower urinary tract symptoms: Secondary | ICD-10-CM

## 2019-01-27 DIAGNOSIS — N138 Other obstructive and reflux uropathy: Secondary | ICD-10-CM

## 2019-01-27 MED ORDER — SULFAMETHOXAZOLE-TRIMETHOPRIM 800-160 MG PO TABS: 1.0000 | ORAL_TABLET | Freq: Two times a day (BID) | ORAL | 0 refills | Status: DC

## 2019-01-27 NOTE — H&P (Signed)
01/27/2019 6:31 PM   [name] 1941/11/15 [id number]  Referring provider: Margo Common, [place], [place] [address]  CC: Urinary retention  HPI: I saw [name] in urology clinic today to discuss his urinary retention.  He is a 78 year old male who does not get regular medical care, who was admitted to [facility] in acute urinary retention with a bilateral upstream hydronephrosis and AKI on 01/18/2019.  Foley catheter was placed, and he improved rapidly.  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  He was started on Flomax and finasteride, and discharged home with a Foley catheter in place.  Urinalysis was negative.   He reports a long history of urinary symptoms with very weak stream, dribbling, and difficulty voiding.  There is no family history of prostate cancer.  His only prior PSA value was 4.6 at the age of 21(normal for age).  There are no aggravating or alleviating factors.  Severity is moderate.  He denies any pain.  He is very interested in having the catheter removed.   PMH: Past Medical History:  Diagnosis Date  . Diabetes mellitus without complication ([place])    type 2  . Hypertension     Surgical History: Past Surgical History:  Procedure Laterality Date  . CATARACT EXTRACTION, BILATERAL    . CHOLECYSTECTOMY  04/1995  . LIPOMA EXCISION  01/28/2011    Allergies:  Allergies  Allergen Reactions  . Lisinopril Cough    Family History: Family History  Problem Relation Age of Onset  . Heart attack Brother   . Alzheimer's disease Sister   . Lung cancer Son     Social History:  reports that he has quit smoking. His smoking use included cigarettes. He has quit using smokeless tobacco.  His smokeless tobacco use included chew. He reports that he does not drink alcohol or use drugs.  ROS: Please see flowsheet from today's date for complete review of systems.  Physical  Exam: BP 135/80 (BP Location: Left Arm, Patient Position: Sitting)   Pulse 86   Ht 5\' 8"  (1.727 m)   Wt 183 lb (83 kg)   BMI 27.83 kg/m    Constitutional:  Alert and oriented, No acute distress. Cardiovascular: Regular rate and rhythm Respiratory: Clear to auscultation bilaterally GI: Abdomen is soft, nontender, nondistended, no abdominal masses GU: No CVA tenderness, Foley with clear yellow urine Lymph: No cervical or inguinal lymphadenopathy. Skin: No rashes, bruises or suspicious lesions. Neurologic: Grossly intact, no focal deficits, moving all 4 extremities. Psychiatric: Normal mood and affect.  Laboratory Data: Reviewed  Pertinent Imaging: I have personally reviewed the CT, 164 g prostate.  Assessment & Plan:   In summary, the patient is a 78 year old male who presented to the hospital in mid February with acute urinary retention, bilateral hydroureteronephrosis down to the bladder, and AKI secondary to bladder outlet obstruction.  He was able to void in clinic today with a catheter removed, with a PVR of 90 cc.  We discussed options moving forward including observation on Flomax and finasteride, with high risk for recurrent urinary retention and ongoing urinary symptoms, as well as infection from his known bladder stone.  His other option for an outlet procedure would be HOLEP with his massive gland size.  We also discussed the role of urodynamics versus proceeding with the presumptive diagnosis of bladder outlet obstruction.  We discussed the risks and benefits of HOLEP at  length.  The procedure requires general anesthesia and takes 2 to 3 hours, and a holmium laser is used to enucleate the prostate and push this tissue into the bladder.  A morcellator is then used to remove this tissue, which is sent for pathology.  Majority of patients are able to discharge the same day with a catheter in place for 2 to 3 days, and will follow-up in clinic for a voiding trial.  Approximately  10% of patients will be admitted overnight to monitor the urine, or if they have multiple comorbidities.  We specifically discussed the risks of bleeding, infection, retrograde ejaculation, temporary urgency and urge incontinence, very low risk of long-term incontinence, and possible need for additional procedures.  Repeat BMP today Schedule HOLEP  Billey Co, MD  [facility] [address], [place], [place] [address] [phone]

## 2019-01-27 NOTE — Progress Notes (Signed)
Fill and Pull Catheter Removal  Patient is present today for a catheter removal.  Patient was cleaned and prepped in a sterile fashion 266ml of sterile water/ saline was instilled into the bladder when the patient felt the urge to urinate and had bladder spasm. 45ml of water was then drained from the balloon.  A 16FR foley cath was removed from the bladder no complications were noted .  Patient as then given some time to void on their own.  Patient can void  26ml on their own after some time.  Patient tolerated well.  After drinking an 8oz bottle of water, patient was not able to void any more. Instructed patient to return to office in the morning for PVR per Dr Diamantina Providence  Preformed by: Shawnie Dapper, CMA

## 2019-01-28 ENCOUNTER — Ambulatory Visit (INDEPENDENT_AMBULATORY_CARE_PROVIDER_SITE_OTHER): Payer: Medicare HMO | Admitting: Family Medicine

## 2019-01-28 ENCOUNTER — Encounter: Payer: Self-pay | Admitting: Family Medicine

## 2019-01-28 VITALS — BP 120/74 | HR 79 | Temp 98.3°F | Resp 16 | Wt 169.0 lb

## 2019-01-28 VITALS — BP 150/80 | HR 93 | Ht 68.0 in | Wt 183.0 lb

## 2019-01-28 DIAGNOSIS — N138 Other obstructive and reflux uropathy: Secondary | ICD-10-CM | POA: Diagnosis not present

## 2019-01-28 DIAGNOSIS — M1612 Unilateral primary osteoarthritis, left hip: Secondary | ICD-10-CM

## 2019-01-28 DIAGNOSIS — N401 Enlarged prostate with lower urinary tract symptoms: Secondary | ICD-10-CM

## 2019-01-28 DIAGNOSIS — Z8639 Personal history of other endocrine, nutritional and metabolic disease: Secondary | ICD-10-CM | POA: Diagnosis not present

## 2019-01-28 DIAGNOSIS — Z01818 Encounter for other preprocedural examination: Secondary | ICD-10-CM | POA: Diagnosis not present

## 2019-01-28 DIAGNOSIS — Z466 Encounter for fitting and adjustment of urinary device: Secondary | ICD-10-CM

## 2019-01-28 DIAGNOSIS — N179 Acute kidney failure, unspecified: Secondary | ICD-10-CM | POA: Diagnosis not present

## 2019-01-28 LAB — BASIC METABOLIC PANEL
BUN/Creatinine Ratio: 31 — ABNORMAL HIGH (ref 10–24)
BUN: 24 mg/dL (ref 8–27)
CO2: 23 mmol/L (ref 20–29)
Calcium: 9 mg/dL (ref 8.6–10.2)
Chloride: 101 mmol/L (ref 96–106)
Creatinine, Ser: 0.77 mg/dL (ref 0.76–1.27)
GFR calc Af Amer: 100 mL/min/{1.73_m2} (ref >59)
GFR calc non Af Amer: 86 mL/min/{1.73_m2} (ref >59)
Glucose: 101 mg/dL — ABNORMAL HIGH (ref 65–99)
Potassium: 4 mmol/L (ref 3.5–5.2)
Sodium: 138 mmol/L (ref 134–144)

## 2019-01-28 LAB — BLADDER SCAN AMB NON-IMAGING

## 2019-01-28 NOTE — Progress Notes (Addendum)
Patient present today for PVR post catheter removal from yesterday per Dr Diamantina Providence. PVR today 46ml. Consulted Zara Council, PA-C. Reinsert catheter per Larene Beach.   Simple Catheter Placement  Due to urinary retention patient is present today for a foley cath placement.  Patient was cleaned and prepped in a sterile fashion with betadine and lidocaine jelly 2% was instilled into the urethra.  A 16 FR foley catheter was inserted, urine return was noted  400 ml, urine was dark yellow in color.  The balloon was filled with 10cc of sterile water.  A bedside bag was attached for drainage. Patient was also given a night bag to take home and was given instruction on how to change from one bag to another.  Patient was given instruction on proper catheter care.  Patient tolerated well, no complications were noted   Preformed by: Elberta Leatherwood, CMA  Additional notes/ Follow up: Patient scheduled for surgery February 11, 2019. Instructed to return to office on 02/07/2019 on nurse schedule for cath exchange and ucx per Dr Diamantina Providence. Appt made.

## 2019-01-31 ENCOUNTER — Other Ambulatory Visit: Payer: Self-pay | Admitting: Family Medicine

## 2019-01-31 DIAGNOSIS — E119 Type 2 diabetes mellitus without complications: Secondary | ICD-10-CM

## 2019-02-03 ENCOUNTER — Ambulatory Visit: Payer: Self-pay

## 2019-02-04 ENCOUNTER — Other Ambulatory Visit: Payer: Medicare HMO

## 2019-02-07 ENCOUNTER — Other Ambulatory Visit: Payer: Self-pay

## 2019-02-07 ENCOUNTER — Other Ambulatory Visit: Payer: Self-pay | Admitting: Radiology

## 2019-02-07 ENCOUNTER — Encounter
Admission: RE | Admit: 2019-02-07 | Discharge: 2019-02-07 | Disposition: A | Discharge status: Home / Self Care | Payer: Medicare HMO | Source: Ambulatory Visit | Attending: Urology | Admitting: Urology

## 2019-02-07 ENCOUNTER — Ambulatory Visit: Payer: Medicare HMO

## 2019-02-07 ENCOUNTER — Telehealth: Payer: Self-pay

## 2019-02-07 ENCOUNTER — Telehealth: Payer: Self-pay | Admitting: Family Medicine

## 2019-02-07 DIAGNOSIS — Z01812 Encounter for preprocedural laboratory examination: Secondary | ICD-10-CM | POA: Diagnosis not present

## 2019-02-07 HISTORY — DX: Cardiac murmur, unspecified: R01.1

## 2019-02-07 LAB — URINALYSIS, ROUTINE W REFLEX MICROSCOPIC
Bacteria, UA: NONE SEEN
Bilirubin Urine: NEGATIVE
Glucose, UA: NEGATIVE mg/dL
Ketones, ur: NEGATIVE mg/dL
Nitrite: NEGATIVE
Protein, ur: 100 mg/dL — AB
RBC / HPF: >50 RBC/hpf — ABNORMAL HIGH (ref 0–5)
Specific Gravity, Urine: 1.015 (ref 1.005–1.030)
Squamous Epithelial / HPF: NONE SEEN (ref 0–5)
WBC, UA: >50 WBC/hpf — ABNORMAL HIGH (ref 0–5)
pH: 6 (ref 5.0–8.0)

## 2019-02-07 MED ORDER — SULFAMETHOXAZOLE-TRIMETHOPRIM 800-160 MG PO TABS: 1.0000 | ORAL_TABLET | Freq: Two times a day (BID) | ORAL | 0 refills | Status: DC

## 2019-02-07 NOTE — Telephone Encounter (Signed)
-----   Message from Billey Co, MD sent at 02/07/2019 12:33 PM EDT ----- Please start him on Bactrim DS BID x 5 days in anticipation of surgerythis Friday.  Thanks Nickolas Madrid, MD 02/07/2019

## 2019-02-07 NOTE — Telephone Encounter (Signed)
Received form and placed it on Dennis's desk. Please advise?

## 2019-02-07 NOTE — Patient Instructions (Signed)
INSTRUCTIONS FOR SURGERY     Your surgery is scheduled for: Friday, February 11, 2019       To find out your arrival time for the day of surgery,          please call [phone] between 1 pm and 3 pm on : Thursday, February 10, 2019     When you arrive for surgery, report to the [facility].       Do NOT stop on the first floor to register.    REMEMBER: Instructions that are not followed completely may result in serious medical risk,  up to and including death, or upon the discretion of your surgeon and anesthesiologist,            your surgery may need to be rescheduled.  __X__ 1. Do not eat food after midnight the night before your procedure.                    No gum, candy, lozenger, tic tacs, tums or hard candies.                  ABSOLUTELY NOTHING SOLID IN YOUR MOUTH AFTER MIDNIGHT                    You may drink unlimited clear liquids up to 2 hours before you are scheduled to arrive for surgery.                   Do not drink anything within those 2 hours unless you need to take medicine, then take the                   smallest amount you need.  Clear liquids include:  water, apple juice without pulp,                   any flavor Gatorade, Black coffee, black tea.  Sugar may be added but no dairy/ honey /lemon.                        Broth and jello is not considered a clear liquid.  __x__  2. On the morning of surgery, please brush your teeth with toothpaste and water. You may rinse with                  mouthwash if you wish but DO NOT SWALLOW TOOTHPASTE OR MOUTHWASH  __X___3. NO alcohol for 24 hours before or after surgery.  __x___ 4.  Do NOT smoke or use e-cigarettes for 24 HOURS PRIOR TO SURGERY.                      DO NOT use any chewable tobacco products for at least 6 hours prior to surgery.  __x___ 5. If you start any new medication after this appointment and prior to surgery, please            Bring it with you on the day of surgery.  ___x__ 6. Notify your doctor if there is any change in your medical condition, such as fever, infection,  vomitting, diarrhea.  __x___ 7.  TAKE A SHOWER/BATH as instructed, on the day of surgery.                   Once you have washed with this soap, do NOT use any of the following: Powders, perfumes or lotions.                   Please do not wear make up, hairpins, clips or nail polish. You MAY wear deodorant.                   Men may shave their face and neck.  Women need to shave 48 hours prior to surgery.                    DO NOT wear ANY jewelry on the day of surgery. If there are rings that are too tight to remove easily,                     please address this prior to the surgery day. Piercings need to be removed.                                                                     NO METAL ON YOUR BODY.                    Do NOT bring any valuables.                      If you came to Pre-Admit testing then you will not need license, insurance card or credit card.                      If you will be staying overnight, please either leave your things in the car or have your family be                     responsible for these items.                     [facility] IS NOT RESPONSIBLE FOR BELONGINGS OR VALUABLES.  ___X__ 8. DO NOT wear contact lenses on surgery day.  You may not have dentures,                     Hearing aides, contacts or glasses in the operating room. These items can be                    Placed in the Recovery Room to receive immediately after surgery.  __x___ 9. IF YOU ARE SCHEDULED TO GO HOME ON THE SAME DAY, YOU MUST                   Have someone to drive you home and to stay with you  for the first 24 hours.                    Have an arrangement prior to arriving on surgery day.  ___x__ 10. Take the following medications on the morning of surgery with a sip of water:  1.  AMLODIPINE/NORVASC                     2. FINASTERIDE/ PROSCAR                     3. TAMSULOSIN/ FLOMAX                     4.                     5.                     6.  _____ 11.  Follow any instructions provided to you by your surgeon.                        Such as enema, clear liquid bowel prep  __X__  12. STOP COUMADIN / PLAVIX / ELIQUIS / ASPIRIN AS OF: TODAY                       THIS INCLUDES BC POWDERS / GOODIES POWDER  __x___ 13. STOP Anti-inflammatories as of: TODAY                      This includes IBUPROFEN / MOTRIN / ADVIL / ALEVE/ NAPROXYN                    YOU MAY TAKE TYLENOL ANY TIME PRIOR TO SURGERY.  __X___ 14.  Stop supplements until after surgery.                     This includes:CINNAMON BARK . STOP AS OF TODAY                  You may continue taking Vitamin B12 / Vitamin D3 but do not take on the morning of surgery.  _____ 15. Bring your CPAP machine into preop with you on the morning of surgery.  ______16.  Stop Metformin 2 full days prior to surgery.  Stop on:                     TAKE 1/2 OF USUAL INSULIN DOSE ON THE EVENING PRIOR TO SURGERY.                     Do NOT take any diabetes medications on surgery day.  ______17.  Continue to take the following medications but do not take on the morning of surgery:                          LOSARTAN   ______18. If staying overnight, please have appropriate shoes to wear to be able to walk around the unit.                   Wear clean and comfortable clothing to the hospital.

## 2019-02-07 NOTE — Telephone Encounter (Signed)
Patient notified he states bactrim was sent to his pharmacy on 01-27-19 at his visit and he filled but was instructed not to start until 3 days prior to surgery. Patient has script and will start tomorrow

## 2019-02-07 NOTE — Telephone Encounter (Signed)
chLeah with [facility] called asking for the medical clearance notes from patients appt. On 01/28/19  Pt will need to stop aspirin  now due to surgery being on the 02/11/19  CB#  [phone]  Thanks [employer]

## 2019-02-07 NOTE — Pre-Procedure Instructions (Signed)
Recent EKG sent to Dr. Natale Milch to review and comment on ABNORMAL note on it.  Last EKG available for comparison is from 2012.

## 2019-02-08 NOTE — Telephone Encounter (Signed)
Copies left on your desk to be faxed to Dr. Doristine Counter office.

## 2019-02-09 ENCOUNTER — Other Ambulatory Visit: Payer: Self-pay | Admitting: Radiology

## 2019-02-09 DIAGNOSIS — N401 Enlarged prostate with lower urinary tract symptoms: Secondary | ICD-10-CM

## 2019-02-09 DIAGNOSIS — N138 Other obstructive and reflux uropathy: Secondary | ICD-10-CM

## 2019-02-09 LAB — URINE CULTURE: Culture: >=100000 — AB

## 2019-02-09 NOTE — Pre-Procedure Instructions (Signed)
UC/MRSA/ STILL PENDING FINAL REPORT MESSAGED TO [facility]

## 2019-02-09 NOTE — Pre-Procedure Instructions (Signed)
CLEARED LOW TO MED RISK/ OPTIMIZED BY PCP ON CHART

## 2019-02-09 NOTE — Telephone Encounter (Signed)
Faxed

## 2019-02-10 DIAGNOSIS — E119 Type 2 diabetes mellitus without complications: Secondary | ICD-10-CM | POA: Diagnosis not present

## 2019-02-10 DIAGNOSIS — R338 Other retention of urine: Secondary | ICD-10-CM | POA: Diagnosis not present

## 2019-02-10 DIAGNOSIS — Z79899 Other long term (current) drug therapy: Secondary | ICD-10-CM | POA: Diagnosis not present

## 2019-02-10 DIAGNOSIS — Z7982 Long term (current) use of aspirin: Secondary | ICD-10-CM | POA: Diagnosis not present

## 2019-02-10 DIAGNOSIS — I10 Essential (primary) hypertension: Secondary | ICD-10-CM | POA: Diagnosis not present

## 2019-02-10 DIAGNOSIS — N210 Calculus in bladder: Secondary | ICD-10-CM | POA: Diagnosis not present

## 2019-02-10 DIAGNOSIS — N401 Enlarged prostate with lower urinary tract symptoms: Secondary | ICD-10-CM | POA: Diagnosis not present

## 2019-02-10 DIAGNOSIS — B9562 Methicillin resistant Staphylococcus aureus infection as the cause of diseases classified elsewhere: Secondary | ICD-10-CM | POA: Diagnosis not present

## 2019-02-10 DIAGNOSIS — N3289 Other specified disorders of bladder: Secondary | ICD-10-CM | POA: Diagnosis not present

## 2019-02-10 MED ORDER — SODIUM CHLORIDE 0.9 % IV SOLN: 1.0000 g | INTRAVENOUS | Status: DC

## 2019-02-10 MED ORDER — GENTAMICIN SULFATE 40 MG/ML IJ SOLN
1.5000 mg/kg | Freq: Once | INTRAVENOUS | Status: AC
  Administered 2019-02-11: 120 mg via INTRAVENOUS
  Filled 2019-02-10: qty 3

## 2019-02-10 MED ORDER — SODIUM CHLORIDE 0.9 % IV SOLN
1.0000 g | INTRAVENOUS | Status: AC
  Administered 2019-02-11: 1 g via INTRAVENOUS
  Filled 2019-02-10: qty 1000

## 2019-02-10 NOTE — Consult Note (Signed)
Pharmacy Antibiotic Note  [name] is a 78 y.o. male admitted on (Not on file) with a urinary obstruction.  Pharmacy has been consulted for gentamicin prophylactic dosing. Appropriate dose is 1.5 mg/kg given that his renal function is normal. Patient is 16% greater than IBW, therefore ABW will be used  Plan: Gentamicin 120 mg IV once   No results for input(s): WBC, CREATININE, LATICACIDVEN, VANCOTROUGH, VANCOPEAK, VANCORANDOM, GENTTROUGH, GENTPEAK, GENTRANDOM, TOBRATROUGH, TOBRAPEAK, TOBRARND, AMIKACINPEAK, AMIKACINTROU, AMIKACIN in the last 168 hours.  Estimated Creatinine Clearance: 70 mL/min (by C-G formula based on SCr of 0.77 mg/dL).    Allergies  Allergen Reactions  . Lisinopril Cough    Antimicrobials this admission: gentamicin 3/12 x 1  Microbiology results: 3/11 UCx: MRSA   Thank you for allowing pharmacy to be a part of this patient's care.  Dallie Piles, PharmD 02/10/2019 2:13 PM

## 2019-02-11 ENCOUNTER — Encounter
Admission: RE | Disposition: A | Discharge status: Home / Self Care | Payer: Self-pay | Source: Ambulatory Visit | Attending: Urology

## 2019-02-11 ENCOUNTER — Ambulatory Visit: Payer: Medicare HMO | Admitting: Certified Registered"

## 2019-02-11 ENCOUNTER — Ambulatory Visit
Admission: RE | Admit: 2019-02-11 | Discharge: 2019-02-11 | Disposition: A | Discharge status: Home / Self Care | Payer: Medicare HMO | Source: Ambulatory Visit | Attending: Urology | Admitting: Urology

## 2019-02-11 ENCOUNTER — Encounter: Payer: Self-pay | Admitting: *Deleted

## 2019-02-11 DIAGNOSIS — B9562 Methicillin resistant Staphylococcus aureus infection as the cause of diseases classified elsewhere: Secondary | ICD-10-CM | POA: Insufficient documentation

## 2019-02-11 DIAGNOSIS — N401 Enlarged prostate with lower urinary tract symptoms: Secondary | ICD-10-CM | POA: Insufficient documentation

## 2019-02-11 DIAGNOSIS — N210 Calculus in bladder: Secondary | ICD-10-CM | POA: Diagnosis not present

## 2019-02-11 DIAGNOSIS — I10 Essential (primary) hypertension: Secondary | ICD-10-CM | POA: Diagnosis not present

## 2019-02-11 DIAGNOSIS — N3289 Other specified disorders of bladder: Secondary | ICD-10-CM | POA: Diagnosis not present

## 2019-02-11 DIAGNOSIS — J45909 Unspecified asthma, uncomplicated: Secondary | ICD-10-CM | POA: Diagnosis not present

## 2019-02-11 DIAGNOSIS — N189 Chronic kidney disease, unspecified: Secondary | ICD-10-CM | POA: Diagnosis not present

## 2019-02-11 DIAGNOSIS — R338 Other retention of urine: Secondary | ICD-10-CM | POA: Diagnosis not present

## 2019-02-11 DIAGNOSIS — E119 Type 2 diabetes mellitus without complications: Secondary | ICD-10-CM | POA: Insufficient documentation

## 2019-02-11 DIAGNOSIS — Z79899 Other long term (current) drug therapy: Secondary | ICD-10-CM | POA: Diagnosis not present

## 2019-02-11 DIAGNOSIS — Z7689 Persons encountering health services in other specified circumstances: Secondary | ICD-10-CM | POA: Diagnosis not present

## 2019-02-11 DIAGNOSIS — E1122 Type 2 diabetes mellitus with diabetic chronic kidney disease: Secondary | ICD-10-CM | POA: Diagnosis not present

## 2019-02-11 DIAGNOSIS — Z7982 Long term (current) use of aspirin: Secondary | ICD-10-CM | POA: Diagnosis not present

## 2019-02-11 DIAGNOSIS — N138 Other obstructive and reflux uropathy: Secondary | ICD-10-CM

## 2019-02-11 DIAGNOSIS — I129 Hypertensive chronic kidney disease with stage 1 through stage 4 chronic kidney disease, or unspecified chronic kidney disease: Secondary | ICD-10-CM | POA: Diagnosis not present

## 2019-02-11 HISTORY — PX: HOLEP-LASER ENUCLEATION OF THE PROSTATE WITH MORCELLATION: SHX6641

## 2019-02-11 HISTORY — PX: CYSTOSCOPY WITH LITHOLAPAXY: SHX1425

## 2019-02-11 LAB — GLUCOSE, CAPILLARY
Glucose-Capillary: 90 mg/dL (ref 70–99)
Glucose-Capillary: 145 mg/dL — ABNORMAL HIGH (ref 70–99)

## 2019-02-11 SURGERY — ENUCLEATION, PROSTATE, USING LASER, WITH MORCELLATION
Anesthesia: General

## 2019-02-11 MED ORDER — SUCCINYLCHOLINE CHLORIDE 20 MG/ML IJ SOLN
INTRAMUSCULAR | Status: DC | PRN
  Administered 2019-02-11: 80 mg via INTRAVENOUS

## 2019-02-11 MED ORDER — PHENYLEPHRINE HCL 10 MG/ML IJ SOLN
INTRAMUSCULAR | Status: AC
  Filled 2019-02-11: qty 1

## 2019-02-11 MED ORDER — LIDOCAINE HCL (CARDIAC) PF 100 MG/5ML IV SOSY
PREFILLED_SYRINGE | INTRAVENOUS | Status: DC | PRN
  Administered 2019-02-11: 100 mg via INTRAVENOUS

## 2019-02-11 MED ORDER — ONDANSETRON HCL 4 MG/2ML IJ SOLN
INTRAMUSCULAR | Status: DC | PRN
  Administered 2019-02-11: 4 mg via INTRAVENOUS

## 2019-02-11 MED ORDER — ROCURONIUM BROMIDE 100 MG/10ML IV SOLN
INTRAVENOUS | Status: DC | PRN
  Administered 2019-02-11: 20 mg via INTRAVENOUS
  Administered 2019-02-11: 30 mg via INTRAVENOUS
  Administered 2019-02-11 (×2): 20 mg via INTRAVENOUS

## 2019-02-11 MED ORDER — FENTANYL CITRATE (PF) 100 MCG/2ML IJ SOLN: 25.0000 ug | INTRAMUSCULAR | Status: DC | PRN

## 2019-02-11 MED ORDER — PROPOFOL 10 MG/ML IV BOLUS
INTRAVENOUS | Status: AC
  Filled 2019-02-11: qty 20

## 2019-02-11 MED ORDER — LIDOCAINE HCL (PF) 2 % IJ SOLN
INTRAMUSCULAR | Status: AC
  Filled 2019-02-11: qty 10

## 2019-02-11 MED ORDER — ACETAMINOPHEN 10 MG/ML IV SOLN
INTRAVENOUS | Status: AC
  Filled 2019-02-11: qty 100

## 2019-02-11 MED ORDER — MIDAZOLAM HCL 2 MG/2ML IJ SOLN
INTRAMUSCULAR | Status: AC
  Filled 2019-02-11: qty 2

## 2019-02-11 MED ORDER — HYDROCODONE-ACETAMINOPHEN 5-325 MG PO TABS: 1.0000 | ORAL_TABLET | ORAL | 0 refills | Status: AC | PRN

## 2019-02-11 MED ORDER — FAMOTIDINE 20 MG PO TABS
ORAL_TABLET | ORAL | Status: AC
  Administered 2019-02-11: 20 mg via ORAL
  Filled 2019-02-11: qty 1

## 2019-02-11 MED ORDER — MIDAZOLAM HCL 2 MG/2ML IJ SOLN
INTRAMUSCULAR | Status: DC | PRN
  Administered 2019-02-11 (×2): 1 mg via INTRAVENOUS

## 2019-02-11 MED ORDER — FENTANYL CITRATE (PF) 100 MCG/2ML IJ SOLN
INTRAMUSCULAR | Status: AC
  Filled 2019-02-11: qty 2

## 2019-02-11 MED ORDER — BELLADONNA ALKALOIDS-OPIUM 16.2-60 MG RE SUPP
RECTAL | Status: DC | PRN
  Administered 2019-02-11: 1 via RECTAL

## 2019-02-11 MED ORDER — BELLADONNA ALKALOIDS-OPIUM 16.2-60 MG RE SUPP
RECTAL | Status: AC
  Filled 2019-02-11: qty 1

## 2019-02-11 MED ORDER — EPHEDRINE SULFATE 50 MG/ML IJ SOLN
INTRAMUSCULAR | Status: AC
  Filled 2019-02-11: qty 1

## 2019-02-11 MED ORDER — PROPOFOL 10 MG/ML IV BOLUS
INTRAVENOUS | Status: DC | PRN
  Administered 2019-02-11: 140 mg via INTRAVENOUS

## 2019-02-11 MED ORDER — FAMOTIDINE 20 MG PO TABS
20.0000 mg | ORAL_TABLET | Freq: Once | ORAL | Status: AC
  Administered 2019-02-11: 20 mg via ORAL

## 2019-02-11 MED ORDER — ROCURONIUM BROMIDE 50 MG/5ML IV SOLN
INTRAVENOUS | Status: AC
  Filled 2019-02-11: qty 1

## 2019-02-11 MED ORDER — SUGAMMADEX SODIUM 200 MG/2ML IV SOLN
INTRAVENOUS | Status: DC | PRN
  Administered 2019-02-11: 330 mg via INTRAVENOUS

## 2019-02-11 MED ORDER — ACETAMINOPHEN 10 MG/ML IV SOLN
INTRAVENOUS | Status: DC | PRN
  Administered 2019-02-11: 1000 mg via INTRAVENOUS

## 2019-02-11 MED ORDER — ONDANSETRON HCL 4 MG/2ML IJ SOLN
INTRAMUSCULAR | Status: AC
  Filled 2019-02-11: qty 2

## 2019-02-11 MED ORDER — SUGAMMADEX SODIUM 200 MG/2ML IV SOLN
INTRAVENOUS | Status: AC
  Filled 2019-02-11: qty 4

## 2019-02-11 MED ORDER — DEXAMETHASONE SODIUM PHOSPHATE 10 MG/ML IJ SOLN
INTRAMUSCULAR | Status: AC
  Filled 2019-02-11: qty 1

## 2019-02-11 MED ORDER — SODIUM CHLORIDE 0.9 % IV SOLN
INTRAVENOUS | Status: DC | PRN
  Administered 2019-02-11: 10 ug/min via INTRAVENOUS

## 2019-02-11 MED ORDER — ONDANSETRON HCL 4 MG/2ML IJ SOLN: 4.0000 mg | Freq: Once | INTRAMUSCULAR | Status: DC | PRN

## 2019-02-11 MED ORDER — EPHEDRINE SULFATE 50 MG/ML IJ SOLN
INTRAMUSCULAR | Status: DC | PRN
  Administered 2019-02-11 (×2): 10 mg via INTRAVENOUS
  Administered 2019-02-11 (×4): 5 mg via INTRAVENOUS
  Administered 2019-02-11: 10 mg via INTRAVENOUS

## 2019-02-11 MED ORDER — FENTANYL CITRATE (PF) 100 MCG/2ML IJ SOLN
INTRAMUSCULAR | Status: DC | PRN
  Administered 2019-02-11 (×6): 50 ug via INTRAVENOUS

## 2019-02-11 MED ORDER — SODIUM CHLORIDE 0.9 % IV SOLN
INTRAVENOUS | Status: DC
  Administered 2019-02-11 (×2): via INTRAVENOUS

## 2019-02-11 MED ORDER — DEXAMETHASONE SODIUM PHOSPHATE 10 MG/ML IJ SOLN
INTRAMUSCULAR | Status: DC | PRN
  Administered 2019-02-11: 10 mg via INTRAVENOUS

## 2019-02-11 MED ORDER — PHENYLEPHRINE HCL 10 MG/ML IJ SOLN
INTRAMUSCULAR | Status: DC | PRN
  Administered 2019-02-11: 50 ug via INTRAVENOUS
  Administered 2019-02-11: 150 ug via INTRAVENOUS
  Administered 2019-02-11: 50 ug via INTRAVENOUS
  Administered 2019-02-11: 150 ug via INTRAVENOUS
  Administered 2019-02-11: 100 ug via INTRAVENOUS

## 2019-02-11 MED ORDER — SUCCINYLCHOLINE CHLORIDE 20 MG/ML IJ SOLN
INTRAMUSCULAR | Status: AC
  Filled 2019-02-11: qty 1

## 2019-02-11 SURGICAL SUPPLY — 35 items
ADAPTER IRRIG TUBE 2 SPIKE SOL (ADAPTER) ×6 IMPLANT
BAG URINE DRAINAGE (UROLOGICAL SUPPLIES) IMPLANT
BAG URO DRAIN 4000ML (MISCELLANEOUS) IMPLANT
CATH FOL 2WAY LX 20X30 (CATHETERS) IMPLANT
CATH FOL 2WAY LX 22X30 (CATHETERS) IMPLANT
CATH FOLEY 3WAY 30CC 22FR (CATHETERS) IMPLANT
CATH FOLEY 3WAY 30CC 24FR (CATHETERS) ×1
CATH URETL 5X70 OPEN END (CATHETERS) ×3 IMPLANT
CATH URTH STD 24FR FL 3W 2 (CATHETERS) IMPLANT
CONTAINER COLLECT MORCELLATR (MISCELLANEOUS) ×2 IMPLANT
DRAPE SHEET LG 3/4 BI-LAMINATE (DRAPES) ×3 IMPLANT
DRAPE UTILITY 15X26 TOWEL STRL (DRAPES) ×1 IMPLANT
FILTER OVERFLOW MORCELLATOR (FILTER) ×2 IMPLANT
GLOVE BIOGEL PI IND STRL 7.5 (GLOVE) ×2 IMPLANT
GLOVE BIOGEL PI INDICATOR 7.5 (GLOVE) ×1
GOWN STRL REUS W/ TWL LRG LVL3 (GOWN DISPOSABLE) ×2 IMPLANT
GOWN STRL REUS W/ TWL XL LVL3 (GOWN DISPOSABLE) ×2 IMPLANT
GOWN STRL REUS W/TWL LRG LVL3 (GOWN DISPOSABLE) ×1
GOWN STRL REUS W/TWL XL LVL3 (GOWN DISPOSABLE) ×1
GUIDEWIRE STR DUAL SENSOR (WIRE) IMPLANT
HOLDER FOLEY CATH W/STRAP (MISCELLANEOUS) ×3 IMPLANT
KIT TURNOVER CYSTO (KITS) ×3 IMPLANT
LASER FIBER 550M SMARTSCOPE (Laser) ×3 IMPLANT
MORCELLATOR COLLECT CONTAINER (MISCELLANEOUS) ×3
MORCELLATOR OVERFLOW FILTER (FILTER) ×3
MORCELLATOR ROTATION 4.75 335 (MISCELLANEOUS) ×3 IMPLANT
PACK CYSTO AR (MISCELLANEOUS) ×3 IMPLANT
SET CYSTO W/LG BORE CLAMP LF (SET/KITS/TRAYS/PACK) ×3 IMPLANT
SET IRRIG Y TYPE TUR BLADDER L (SET/KITS/TRAYS/PACK) ×3 IMPLANT
SLEEVE PROTECTION STRL DISP (MISCELLANEOUS) ×6 IMPLANT
SOL .9 NS 3000ML IRR  AL (IV SOLUTION) ×22
SOL .9 NS 3000ML IRR UROMATIC (IV SOLUTION) ×8 IMPLANT
SYRINGE IRR TOOMEY STRL 70CC (SYRINGE) ×3 IMPLANT
TUBE PUMP MORCELLATOR PIRANHA (TUBING) ×3 IMPLANT
WATER STERILE IRR 1000ML POUR (IV SOLUTION) ×3 IMPLANT

## 2019-02-11 NOTE — Anesthesia Preprocedure Evaluation (Signed)
Anesthesia Evaluation  Patient identified by MRN, date of birth, ID band Patient awake    Reviewed: Allergy & Precautions, H&P , NPO status , Patient's Chart, lab work & pertinent test results, reviewed documented beta blocker date and time   History of Anesthesia Complications Negative for: history of anesthetic complications  Airway Mallampati: II  TM Distance: >3 FB Neck ROM: full    Dental  (+) Dental Advidsory Given, Edentulous Upper, Upper Dentures, Teeth Intact   Pulmonary neg shortness of breath, asthma , neg sleep apnea, neg COPD, neg recent URI, former smoker,           Cardiovascular Exercise Tolerance: Good hypertension, (-) angina(-) CAD, (-) Past MI, (-) Cardiac Stents and (-) CABG (-) dysrhythmias + Valvular Problems/Murmurs      Neuro/Psych negative neurological ROS  negative psych ROS   GI/Hepatic GERD  ,(+) Hepatitis -, C  Endo/Other  diabetes  Renal/GU CRFRenal disease  negative genitourinary   Musculoskeletal   Abdominal   Peds  Hematology negative hematology ROS (+)   Anesthesia Other Findings Past Medical History: No date: Diabetes mellitus without complication ([facility])     Comment:  type 2. has not taken meds in two years  No date: Heart murmur     Comment:  has had for years No date: Hypertension   Reproductive/Obstetrics negative OB ROS                             Anesthesia Physical Anesthesia Plan  ASA: III  Anesthesia Plan: General   Post-op Pain Management:    Induction: Intravenous  PONV Risk Score and Plan: 2 and Ondansetron, Dexamethasone and Treatment may vary due to age or medical condition  Airway Management Planned: Oral ETT  Additional Equipment:   Intra-op Plan:   Post-operative Plan: Extubation in OR  Informed Consent: I have reviewed the patients History and Physical, chart, labs and discussed the procedure including the risks,  benefits and alternatives for the proposed anesthesia with the patient or authorized representative who has indicated his/her understanding and acceptance.     Dental Advisory Given  Plan Discussed with: Anesthesiologist, CRNA and Surgeon  Anesthesia Plan Comments:         Anesthesia Quick Evaluation

## 2019-02-11 NOTE — Anesthesia Procedure Notes (Signed)
Procedure Name: Intubation Date/Time: 02/11/2019 7:53 AM Performed by: Lavone Orn, CRNA Pre-anesthesia Checklist: Patient identified, Emergency Drugs available, Suction available, Patient being monitored and Timeout performed Patient Re-evaluated:Patient Re-evaluated prior to induction Oxygen Delivery Method: Circle system utilized Preoxygenation: Pre-oxygenation with 100% oxygen Induction Type: IV induction Ventilation: Oral airway inserted - appropriate to patient size and Mask ventilation without difficulty Laryngoscope Size: Mac and 4 Grade View: Grade I Tube type: Oral Number of attempts: 1 Airway Equipment and Method: Stylet Placement Confirmation: ETT inserted through vocal cords under direct vision,  positive ETCO2 and breath sounds checked- equal and bilateral Secured at: 21 cm Tube secured with: Tape Dental Injury: Teeth and Oropharynx as per pre-operative assessment

## 2019-02-11 NOTE — Discharge Instructions (Signed)
Indwelling Urinary Catheter Care, Adult An indwelling urinary catheter is a thin tube that is put into your bladder. The tube helps to drain pee (urine) out of your body. The tube goes in through your urethra. Your urethra is where pee comes out of your body. Your pee will come out through the catheter, then it will go into a bag (drainage bag). Take good care of your catheter so it will work well. How to wear your catheter and bag Supplies needed  Sticky tape (adhesive tape) or a leg strap.  Alcohol wipe or soap and water (if you use tape).  A clean towel (if you use tape).  Large overnight bag.  Smaller bag (leg bag). Wearing your catheter Attach your catheter to your leg with tape or a leg strap.  Make sure the catheter is not pulled tight.  If a leg strap gets wet, take it off and put on a dry strap.  If you use tape to hold the bag on your leg: 1. Use an alcohol wipe or soap and water to wash your skin where the tape made it sticky before. 2. Use a clean towel to pat-dry that skin. 3. Use new tape to make the bag stay on your leg. Wearing your bags You should have been given a large overnight bag.  You may wear the overnight bag in the day or night.  Always have the overnight bag lower than your bladder.  Do not let the bag touch the floor.  Before you go to sleep, put a clean plastic bag in a wastebasket. Then hang the overnight bag inside the wastebasket. You should also have a smaller leg bag that fits under your clothes.  Always wear the leg bag below your knee.  Do not wear your leg bag at night. How to care for your skin and catheter Supplies needed  A clean washcloth.  Water and mild soap.  A clean towel. Caring for your skin and catheter      Clean the skin around your catheter every day: ? Wash your hands with soap and water. ? Wet a clean washcloth in warm water and mild soap. ? Clean the skin around your urethra. ? If you are male: ? Gently  spread the folds of skin around your vagina (labia). ? With the washcloth in your other hand, wipe the inner side of your labia on each side. Wipe from front to back. ? If you are male: ? Pull back any skin that covers the end of your penis (foreskin). ? With the washcloth in your other hand, wipe your penis in small circles. Start wiping at the tip of your penis, then move away from the catheter. ? With your free hand, hold the catheter close to where it goes into your body. ? Keep holding the catheter during cleaning so it does not get pulled out. ? With the washcloth in your other hand, clean the catheter. ? Only wipe downward on the catheter. ? Do not wipe upward toward your body. Doing this may push germs into your urethra and cause infection. ? Use a clean towel to pat-dry the catheter and the skin around it. Make sure to wipe off all soap. ? Wash your hands with soap and water.  Shower every day. Do not take baths.  Do not use cream, ointment, or lotion on the area where the catheter goes into your body, unless your doctor tells you to.  Do not use powders, sprays, or lotions  on your genital area.  Check your skin around the catheter every day for signs of infection. Check for: ? Redness, swelling, or pain. ? Fluid or blood. ? Warmth. ? Pus or a bad smell. How to empty the bag Supplies needed  Rubbing alcohol.  Gauze pad or cotton ball.  Tape or a leg strap. Emptying the bag Pour the pee out of your bag when it is ?- full, or at least 2-3 times a day. Do this for your overnight bag and your leg bag. 1. Wash your hands with soap and water. 2. Separate (detach) the bag from your leg. 3. Hold the bag over the toilet or a clean pail. Keep the bag lower than your hips and bladder. This is so the pee (urine) does not go back into the tube. 4. Open the pour spout. It is at the bottom of the bag. 5. Empty the pee into the toilet or pail. Do not let the pour spout touch any  surface. 6. Put rubbing alcohol on a gauze pad or cotton ball. 7. Use the gauze pad or cotton ball to clean the pour spout. 8. Close the pour spout. 9. Attach the bag to your leg with tape or a leg strap. 10. Wash your hands with soap and water. Follow instructions for cleaning the drainage bag:  From the product maker.  As told by your doctor. How to change the bag Supplies needed  Alcohol wipes.  A clean bag.  Tape or a leg strap. Changing the bag Replace your bag with a clean bag once a month. If it starts to leak, smell bad, or look dirty, change it sooner. 1. Wash your hands with soap and water. 2. Separate the dirty bag from your leg. 3. Pinch the catheter with your fingers so that pee does not spill out. 4. Separate the catheter tube from the bag tube where these tubes connect (at the connection valve). Do not let the tubes touch any surface. 5. Clean the end of the catheter tube with an alcohol wipe. Use a different alcohol wipe to clean the end of the bag tube. 6. Connect the catheter tube to the tube of the clean bag. 7. Attach the clean bag to your leg with tape or a leg strap. Do not make the bag tight on your leg. 8. Wash your hands with soap and water. General rules   Never pull on your catheter. Never try to take it out. Doing that can hurt you.  Always wash your hands before and after you touch your catheter or bag. Use a mild, fragrance-free soap. If you do not have soap and water, use hand sanitizer.  Always make sure there are no twists or bends (kinks) in the catheter tube.  Always make sure there are no leaks in the catheter or bag.  Drink enough fluid to keep your pee pale yellow.  Do not take baths, swim, or use a hot tub.  If you are male, wipe from front to back after you poop (have a bowel movement). Contact a doctor if:  Your pee is cloudy.  Your pee smells worse than usual.  Your catheter gets clogged.  Your catheter leaks.  Your  bladder feels full. Get help right away if:  You have redness, swelling, or pain where the catheter goes into your body.  You have fluid, blood, pus, or a bad smell coming from the area where the catheter goes into your body.  Your skin feels warm  where the catheter goes into your body.  You have a fever.  You have pain in your: ? Belly (abdomen). ? Legs. ? Lower back. ? Bladder.  You see blood in the catheter.  Your pee is pink or red.  You feel sick to your stomach (nauseous).  You throw up (vomit).  You have chills.  Your pee is not draining into the bag.  Your catheter gets pulled out. Summary  An indwelling urinary catheter is a thin tube that is placed into the bladder to help drain pee (urine) out of the body.  The catheter is placed into the part of the body that drains pee from the bladder (urethra).  Taking good care of your catheter will keep it working properly and help prevent problems.  Always wash your hands before and after touching your catheter or bag.  Never pull on your catheter or try to take it out. This information is not intended to replace advice given to you by your health care provider. Make sure you discuss any questions you have with your health care provider. Document Released: 03/14/2013 Document Revised: 05/10/2018 Document Reviewed: 07/03/2017 Elsevier Interactive Patient Education  2019 [place]   1) The drugs that you were given will stay in your system until tomorrow so for the next 24 hours you should not:  A) Drive an automobile B) Make any legal decisions C) Drink any alcoholic beverage   2) You may resume regular meals tomorrow.  Today it is better to start with liquids and gradually work up to solid foods.  You may eat anything you prefer, but it is better to start with liquids, then soup and crackers, and gradually work up to solid foods.   3) Please notify your  doctor immediately if you have any unusual bleeding, trouble breathing, redness and pain at the surgery site, drainage, fever, or pain not relieved by medication. 4)   5) Your post-operative visit with Dr.                                     is: Date:                        Time:    Please call to schedule your post-operative visit.  6) Additional Instructions:

## 2019-02-11 NOTE — H&P (Signed)
UROLOGY H&P UPDATE  Agree with prior H&P dated 01/27/2019. 78 yo M with recurrent foley dependent urinary retention, severe obstructive urinary symptoms, 2cm bladder stone, and 164g prostate on CT.  Cardiac: RRR Lungs: CTA bilaterally  Laterality: n/a Procedure: HOLEP and cystolitholopaxy  Urine: urine cx 3/9 MRSA >100k, has been on culture appropriate Bactrim x 5 days. Will do Gentamycin  Informed consent obtained, we specifically discussed the risks of bleeding, infection, post-operative pain, need for additional procedures, temporary urgency and urge incontinence, low risk of long term incontinence, possible overnight hospitalization, and need for temporary foley placement.  Billey Co, MD 02/11/2019

## 2019-02-11 NOTE — Op Note (Signed)
Date of procedure: 02/11/19  Preoperative diagnosis:  1. BPH with urinary retention, bladder stone  Postoperative diagnosis:  1. Same  Procedure: 1. Cystolitholopaxy 2cm stone 2. HoLEP  Surgeon: Nickolas Madrid, MD  Anesthesia: General  Complications: None  Intraoperative findings:  1. Massive prostate, severe bladder trabeculations, 2cm bladder stone 2. Uncomplicated cystolitholopaxy and Holep, 100g resection 3. Ureteral orifices intact bilaterally and veru at conclusion of case  EBL: 50cc  Specimens: prostate chips   Enucleation time: 112 minutes  Morcellation time: 12 minutes  Intra-op weight: 100g  Drains: 24F 3-way, 60cc in balloon  Indication: [name] is a 78 y.o. patient with foley dependent urinary retention and 2cm bladder stone.  After reviewing the management options for treatment, they elected to proceed with the above surgical procedure(s). We have discussed the potential benefits and risks of the procedure, side effects of the proposed treatment, the likelihood of the patient achieving the goals of the procedure, and any potential problems that might occur during the procedure or recuperation.  We specifically discussed the risks of bleeding, infection, hematuria and clot retention, need for additional procedures, possible overnight hospital stay, temporary urgency and urge incontinence, and retrograde ejaculation.  Informed consent has been obtained.   Description of procedure:  The patient was taken to the operating room and general anesthesia was induced.  The patient was placed in the dorsal lithotomy position, prepped and draped in the usual sterile fashion, and preoperative antibiotics(Amp/gentamycin) were administered.  SCDs were placed for DVT prophylaxis.  A preoperative time-out was performed.   The 44 French continuous flow resectoscope was inserted into the urethra using the visual obturator  The prostate was massive with a high bladder neck.  The bladder was thoroughly inspected and notable for severe bladder trabeculations and a black 2cm bladder stone.  The ureteral orifices were located in orthotopic position.   The 500 micron laser fiber was advanced through the laser bridge, and the stone fragmented to small pieces using settings of 1.5J and 15Hz . These were irrigated free from the bladder.  The laser was then set to 2 J and 50 Hz and was used to make an incision at the 6 o'clock position to the level of the capsule from the bladder neck to the verumontanum.  The lateral lobes were then incised circumferentially until they were disconnected from the surrounding tissue.  The capsule was examined and laser was used for meticulous hemostasis.  The 44 French resectoscope was then switched out for the 58 French nephroscope and the lobes were morcellated and the tissue sent to pathology.  A 24 French three-way catheter was inserted easily, and CBI was initiated.  60cc were placed in the balloon.  Urine was clear on fast CBI. A belladonna suppository was placed in the rectum.  The patient tolerated the procedure well without any immediate complications and was extubated and transferred to the recovery room in stable condition.  Urine was clear on fast CBI.  Disposition: Stable to PACU  Plan: Maintain foley 3 days, continue treatment dose BACTRIM Likely discharge home today if urine clear to light pink in PACU  Nickolas Madrid, MD

## 2019-02-11 NOTE — Anesthesia Post-op Follow-up Note (Signed)
Anesthesia QCDR form completed.        

## 2019-02-11 NOTE — Transfer of Care (Signed)
Immediate Anesthesia Transfer of Care Note  Patient: [name]  Procedure(s) Performed: HOLEP-LASER ENUCLEATION OF THE PROSTATE WITH MORCELLATION (N/A ) CYSTOSCOPY WITH LITHOLAPAXY  Patient Location: PACU  Anesthesia Type:General  Level of Consciousness: drowsy  Airway & Oxygen Therapy: Patient Spontanous Breathing and Patient connected to face mask oxygen  Post-op Assessment: Report given to RN and Post -op Vital signs reviewed and stable  Post vital signs: stable  Last Vitals:  Vitals Value Taken Time  BP 127/49 02/11/2019 11:22 AM  Temp    Pulse 79 02/11/2019 11:27 AM  Resp 18 02/11/2019 11:27 AM  SpO2 98 % 02/11/2019 11:27 AM  Vitals shown include unvalidated device data.  Last Pain:  Vitals:   02/11/19 0640  TempSrc: Tympanic  PainSc: 0-No pain      Patients Stated Pain Goal: 0 (01/11/14 5208)  Complications: No apparent anesthesia complications

## 2019-02-13 ENCOUNTER — Encounter: Payer: Self-pay | Admitting: Urology

## 2019-02-13 NOTE — Anesthesia Postprocedure Evaluation (Signed)
Anesthesia Post Note  Patient: [name]  Procedure(s) Performed: HOLEP-LASER ENUCLEATION OF THE PROSTATE WITH MORCELLATION (N/A ) CYSTOSCOPY WITH LITHOLAPAXY  Patient location during evaluation: PACU Anesthesia Type: General Level of consciousness: awake and alert Pain management: pain level controlled Vital Signs Assessment: post-procedure vital signs reviewed and stable Respiratory status: spontaneous breathing, nonlabored ventilation, respiratory function stable and patient connected to nasal cannula oxygen Cardiovascular status: blood pressure returned to baseline and stable Postop Assessment: no apparent nausea or vomiting Anesthetic complications: no     Last Vitals:  Vitals:   02/11/19 1243 02/11/19 1306  BP: (!) 118/56 (!) 119/54  Pulse: 92 80  Resp: 16 16  Temp: 36.6 C   SpO2: 98% 98%    Last Pain:  Vitals:   02/11/19 1306  TempSrc:   PainSc: 0-No pain                 Martha Clan

## 2019-02-14 LAB — SURGICAL PATHOLOGY

## 2019-02-15 ENCOUNTER — Telehealth: Payer: Self-pay

## 2019-02-15 NOTE — Telephone Encounter (Signed)
Patient notified

## 2019-02-15 NOTE — Telephone Encounter (Signed)
-----   Message from Billey Co, MD sent at 02/15/2019 10:09 AM EDT ----- Doristine Devoid news, no cancer seen in his prostate chips from his HOLEP surgery. Keep scheduled follow up for foley removal Thursday, ok to move up void trial sooner if patient amenable.  Nickolas Madrid, MD 02/15/2019

## 2019-02-17 ENCOUNTER — Ambulatory Visit: Payer: Medicare HMO

## 2019-02-17 ENCOUNTER — Other Ambulatory Visit: Payer: Self-pay

## 2019-02-17 DIAGNOSIS — Z466 Encounter for fitting and adjustment of urinary device: Secondary | ICD-10-CM

## 2019-02-17 NOTE — Progress Notes (Signed)
Catheter Removal  Patient is present today for a catheter removal.  27ml of water was drained from the balloon. A 24FR foley cath was removed from the bladder no complications were noted . Patient tolerated well.  Preformed by: Fonnie Jarvis, CMA  Follow up/ Additional notes: keep post op appointment

## 2019-03-03 DIAGNOSIS — R69 Illness, unspecified: Secondary | ICD-10-CM | POA: Diagnosis not present

## 2019-03-23 ENCOUNTER — Ambulatory Visit: Payer: Medicare HMO | Admitting: Urology

## 2019-03-24 ENCOUNTER — Encounter: Payer: Self-pay | Admitting: Urology

## 2019-03-24 ENCOUNTER — Ambulatory Visit (INDEPENDENT_AMBULATORY_CARE_PROVIDER_SITE_OTHER): Payer: Medicare HMO | Admitting: Urology

## 2019-03-24 ENCOUNTER — Other Ambulatory Visit: Payer: Self-pay

## 2019-03-24 VITALS — BP 173/85 | HR 79 | Ht 67.0 in | Wt 171.0 lb

## 2019-03-24 DIAGNOSIS — N138 Other obstructive and reflux uropathy: Secondary | ICD-10-CM

## 2019-03-24 DIAGNOSIS — N401 Enlarged prostate with lower urinary tract symptoms: Secondary | ICD-10-CM

## 2019-03-24 LAB — BLADDER SCAN AMB NON-IMAGING

## 2019-03-24 NOTE — Progress Notes (Signed)
   03/24/2019 9:30 AM   [name] 11-Aug-1941 [id number]  Reason for visit: Follow up after HOLEP 02/11/2019  HPI: [name] is a 78 year old male here for 6-week postop visit after undergoing HOLEP and cystolitholapaxy on 02/11/2019.  He was in Foley dependent urinary retention with a 164 g prostate on CT and a 2 cm bladder stone.  Pathology showed only benign prostatic tissue.  Since surgery, he reports he has been doing extremely well.  His IPSS score today is 5, with quality-of-life delighted.  PVR was 10 cc.  He reports he is voiding with a very strong stream.  He is having very mild urge incontinence that is minimally bothersome.  This is continued to improve since surgery.  He also reports nocturia 3-4 times per night, however he is drinking coffee in 16 ounces of water in the evening before bed.  Overall he is very pleased with his outcome.   ROS: Please see flowsheet from today's date for complete review of systems.  Assessment & Plan:   In summary, the patient is a 78 year old male with history of Foley dependent urinary retention and 164 g prostate with 2 cm bladder stone status post HOLEP and cystolitholapaxy on 02/11/2019.  Post-op he is doing extremely well with a strong stream and PVR of 10 cc.  We discussed strategies to reduce nocturia including minimizing fluids and especially coffee in the evenings, ideally after 5-6 PM, and double voiding prior to bed.  RTC 6 months for PVR  A total of 15 minutes were spent face-to-face with the patient, greater than 50% was spent in patient education, counseling, and coordination of care regarding postop expectations after HOLEP, and strategies for nocturia.   Billey Co, [facility] [address] [place],  [address] [phone]

## 2019-06-01 ENCOUNTER — Other Ambulatory Visit: Payer: Self-pay | Admitting: Family Medicine

## 2019-06-02 DIAGNOSIS — R69 Illness, unspecified: Secondary | ICD-10-CM | POA: Diagnosis not present

## 2019-06-07 ENCOUNTER — Other Ambulatory Visit: Payer: Self-pay | Admitting: Gastroenterology

## 2019-06-07 DIAGNOSIS — R140 Abdominal distension (gaseous): Secondary | ICD-10-CM

## 2019-06-07 DIAGNOSIS — R1901 Right upper quadrant abdominal swelling, mass and lump: Secondary | ICD-10-CM | POA: Diagnosis not present

## 2019-06-09 ENCOUNTER — Ambulatory Visit: Payer: Medicare HMO

## 2019-06-13 ENCOUNTER — Other Ambulatory Visit: Payer: Self-pay

## 2019-06-13 ENCOUNTER — Ambulatory Visit (INDEPENDENT_AMBULATORY_CARE_PROVIDER_SITE_OTHER): Payer: Medicare HMO | Admitting: Family Medicine

## 2019-06-13 ENCOUNTER — Encounter: Payer: Self-pay | Admitting: Family Medicine

## 2019-06-13 VITALS — BP 160/84 | HR 57 | Temp 98.6°F | Wt 175.0 lb

## 2019-06-13 DIAGNOSIS — R5383 Other fatigue: Secondary | ICD-10-CM

## 2019-06-13 DIAGNOSIS — L02422 Furuncle of left axilla: Secondary | ICD-10-CM

## 2019-06-13 MED ORDER — CEPHALEXIN 500 MG PO CAPS: 500.0000 mg | ORAL_CAPSULE | Freq: Four times a day (QID) | ORAL | 0 refills | Status: DC

## 2019-06-13 NOTE — Progress Notes (Signed)
[name]  MRN: [id number] DOB: 08-Jul-1941  Subjective:  HPI   The patient is a 78 year old male who presents for evaluation of a boil under his left arm.  He states he first noticed it 4 days ago.  He has not had any fever.  It is tender to the touch and he states today was the first day that it started draining a little.  Patient Active Problem List   Diagnosis Date Noted  . ARF (acute renal failure) ([place]) 01/18/2019  . Closed fracture of shaft of fibula 07/03/2016  . 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 LBP (low back pain) 08/20/2015  . CAFL (chronic airflow limitation) ([place]) 08/20/2015  . Esophageal reflux 08/20/2015  . Cardiac murmur 08/20/2015  . 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 Personal history of methicillin resistant Staphylococcus aureus 08/20/2015  . Essential (primary) hypertension 08/20/2015  . Muscle ache 08/20/2015  . Edema, peripheral 08/20/2015  . Asthma 08/20/2015  . Allergic rhinitis, seasonal 08/20/2015  . 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 Diabetes mellitus, type 2 ([place]) 08/20/2015    Past Medical History:  Diagnosis Date  . Diabetes mellitus without complication ([place])    type 2. has not taken meds in two years   . Heart murmur    has had for years  . Hypertension    Past Surgical History:  Procedure Laterality Date  . CATARACT EXTRACTION, BILATERAL    . CHOLECYSTECTOMY  04/1995  . CYSTOSCOPY WITH LITHOLAPAXY  02/11/2019   Procedure: CYSTOSCOPY WITH LITHOLAPAXY;  Surgeon: Billey Co, MD;  Location: [facility] ORS;  Service: Urology;;  . EYE SURGERY    . HOLEP-LASER ENUCLEATION OF THE PROSTATE WITH MORCELLATION N/A 02/11/2019   Procedure: HOLEP-LASER ENUCLEATION OF THE PROSTATE WITH MORCELLATION;  Surgeon: Billey Co, MD;  Location: [facility] ORS;  Service: Urology;  Laterality: N/A;  . LIPOMA EXCISION  01/28/2011   Family History  Problem Relation Age of  Onset  . Heart attack Brother   . Alzheimer's disease Sister   . Lung cancer Son    Social History   Socioeconomic History  . Marital status: Married    Spouse name: [name]  . Number of children: 5  . Years of education: Not on file  . Highest education level: 10th grade  Occupational History  . Occupation: worked at [employer] for 46 yrs    Comment: retired  Scientific laboratory technician  . Financial resource strain: Not hard at all  . Food insecurity    Worry: Never true    Inability: Never true  . Transportation needs    Medical: No    Non-medical: No  Tobacco Use  . Smoking status: Former Smoker    Packs/day: 1.00    Types: Cigarettes    Quit date: 1956    Years since quitting: 64.5  . Smokeless tobacco: Former Systems developer    Types: Chew    Quit date: 2005  . Tobacco comment: 78 years old for 1 year  Substance and Sexual Activity  . Alcohol use: No    Alcohol/week: 0.0 standard drinks  . Drug use: No  . Sexual activity: Not on file  Lifestyle  . Physical activity    Days per week: 0 days    Minutes per session: 0 min  . Stress: Not at all  Relationships  . Social connections    Talks on phone: Patient refused  Gets together: Patient refused    Attends religious service: Patient refused    Active member of club or organization: Patient refused    Attends meetings of clubs or organizations: Patient refused    Relationship status: Patient refused  . Intimate partner violence    Fear of current or ex partner: No    Emotionally abused: No    Physically abused: No    Forced sexual activity: No  Other Topics Concern  . Not on file  Social History Narrative  . Not on file   Outpatient Encounter Medications as of 06/13/2019  Medication Sig  . amLODipine (NORVASC) 10 MG tablet TAKE 1 TABLET BY MOUTH EVERY DAY (Patient taking differently: Take 10 mg by mouth daily. )  . Blood Glucose Monitoring Suppl (ONE TOUCH ULTRA 2) w/Device KIT OneTouch Ultra2 Meter kit  . Cinnamon Bark  POWD Take 1 Scoop by mouth daily with breakfast.   . ONETOUCH VERIO test strip TEST FASTING BLOOD SUGAR ONCE DAILY   No facility-administered encounter medications on file as of 06/13/2019.    Allergies  Allergen Reactions  . Lisinopril Cough   Review of Systems  Constitutional: Negative for fever.    Objective:  BP (!) 160/84 (BP Location: Right Arm, Patient Position: Sitting, Cuff Size: Normal)   Pulse (!) 57   Temp 98.6 F (37 C) (Oral)   Wt 175 lb (79.4 kg)   SpO2 97%   BMI 27.41 kg/m   Physical Exam  Constitutional: He is oriented to person, place, and time and well-developed, well-nourished, and in no distress.  HENT:  Head: Normocephalic.  Eyes: Conjunctivae are normal.  Neck: Neck supple.  Cardiovascular: Normal rate.  Pulmonary/Chest: Effort normal.  Abdominal: Soft.  Musculoskeletal: Normal range of motion.  Neurological: He is alert and oriented to person, place, and time.  Skin: No rash noted. There is erythema.  1.5 cm red tender furuncle in left axilla with purulent drainage.  Psychiatric: Mood, affect and judgment normal.    Assessment and Plan :   1. Furuncle of left axilla Onset with a red very tender boil in the left axilla with purulent drainage over the past 4-5 days. No fever. Will treat with Keflex 500 mg QID for a week. Apply hot Epsom Salt water compresses. Recheck prn. - cephALEXin (KEFLEX) 500 MG capsule; Take 1 capsule (500 mg total) by mouth 4 (four) times daily.  Dispense: 30 capsule; Refill: 0  2. Fatigue, unspecified type More fatigue working out in the heat or fishing recently. Will check labs for dehydration or electrolyte imbalance. Must drink extra fluid (Gatorade and water). Follow up pending lab reports. - CBC with Differential/Platelet - Comprehensive metabolic panel

## 2019-06-14 LAB — CBC WITH DIFFERENTIAL/PLATELET
Basophils Absolute: 0.1 10*3/uL (ref 0.0–0.2)
Basos: 1 %
EOS (ABSOLUTE): 0.1 10*3/uL (ref 0.0–0.4)
Eos: 1 %
Hematocrit: 46.6 % (ref 37.5–51.0)
Hemoglobin: 15.1 g/dL (ref 13.0–17.7)
Immature Grans (Abs): 0 10*3/uL (ref 0.0–0.1)
Immature Granulocytes: 0 %
Lymphocytes Absolute: 2.1 10*3/uL (ref 0.7–3.1)
Lymphs: 30 %
MCH: 29.3 pg (ref 26.6–33.0)
MCHC: 32.4 g/dL (ref 31.5–35.7)
MCV: 90 fL (ref 79–97)
Monocytes Absolute: 0.6 10*3/uL (ref 0.1–0.9)
Monocytes: 8 %
Neutrophils Absolute: 4.3 10*3/uL (ref 1.4–7.0)
Neutrophils: 60 %
Platelets: 207 10*3/uL (ref 150–450)
RBC: 5.16 x10E6/uL (ref 4.14–5.80)
RDW: 12.1 % (ref 11.6–15.4)
WBC: 7.1 10*3/uL (ref 3.4–10.8)

## 2019-06-14 LAB — COMPREHENSIVE METABOLIC PANEL
ALT: 18 IU/L (ref 0–44)
AST: 19 IU/L (ref 0–40)
Albumin/Globulin Ratio: 2 (ref 1.2–2.2)
Albumin: 4.3 g/dL (ref 3.7–4.7)
Alkaline Phosphatase: 84 IU/L (ref 39–117)
BUN/Creatinine Ratio: 33 — ABNORMAL HIGH (ref 10–24)
BUN: 24 mg/dL (ref 8–27)
Bilirubin Total: 0.4 mg/dL (ref 0.0–1.2)
CO2: 20 mmol/L (ref 20–29)
Calcium: 9.6 mg/dL (ref 8.6–10.2)
Chloride: 99 mmol/L (ref 96–106)
Creatinine, Ser: 0.72 mg/dL — ABNORMAL LOW (ref 0.76–1.27)
GFR calc Af Amer: 103 mL/min/{1.73_m2} (ref >59)
GFR calc non Af Amer: 89 mL/min/{1.73_m2} (ref >59)
Globulin, Total: 2.2 g/dL (ref 1.5–4.5)
Glucose: 127 mg/dL — ABNORMAL HIGH (ref 65–99)
Potassium: 3.9 mmol/L (ref 3.5–5.2)
Sodium: 137 mmol/L (ref 134–144)
Total Protein: 6.5 g/dL (ref 6.0–8.5)

## 2019-08-10 DIAGNOSIS — R69 Illness, unspecified: Secondary | ICD-10-CM | POA: Diagnosis not present

## 2019-09-07 IMAGING — CT CT ABD-PELV W/O
2 of 4 series · 15 of 46 positions shown, 17 images · non-contrast
Comparison: No comparison CT.

CLINICAL DATA: 77-year-old male with acute renal failure. Recent
URI and prostate infection. Prior cholecystectomy. Initial
encounter.

EXAM:
CT ABDOMEN AND PELVIS WITHOUT CONTRAST
TECHNIQUE: Multidetector CT imaging of the abdomen and pelvis was performed
following the standard protocol without IV contrast.

[Series 2: axial st · axial · 0.85mm/px · z∈[-1014,-574]mm · 12 of 97 slices shown, 14 images]
[im 5/97  soft-tissue]
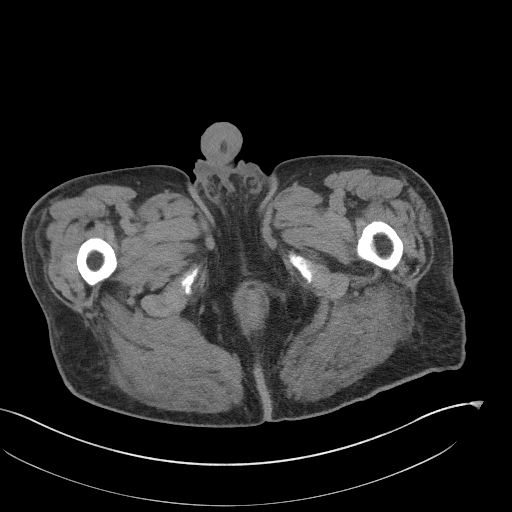
[im 5/97  bone]
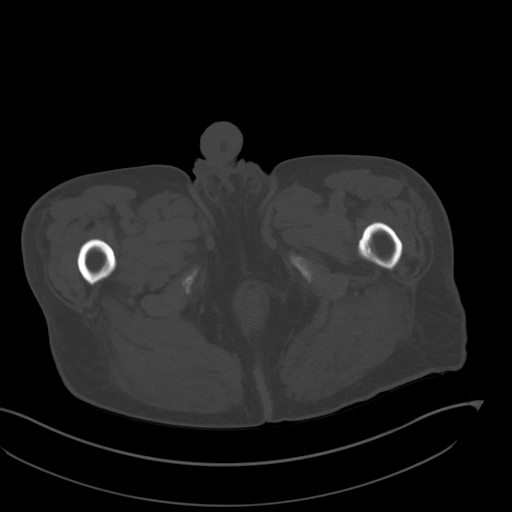
[im 13/97  soft-tissue]
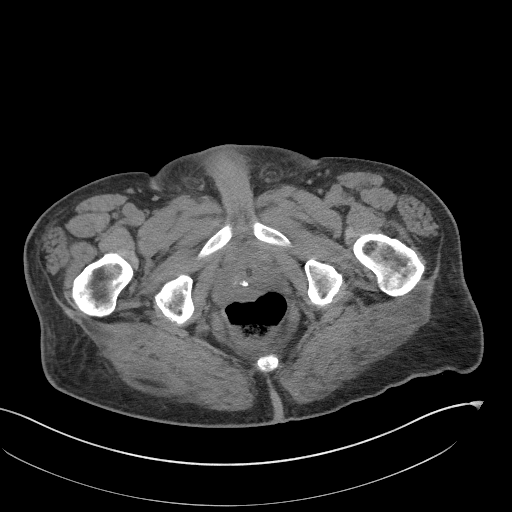
[im 21/97  soft-tissue]
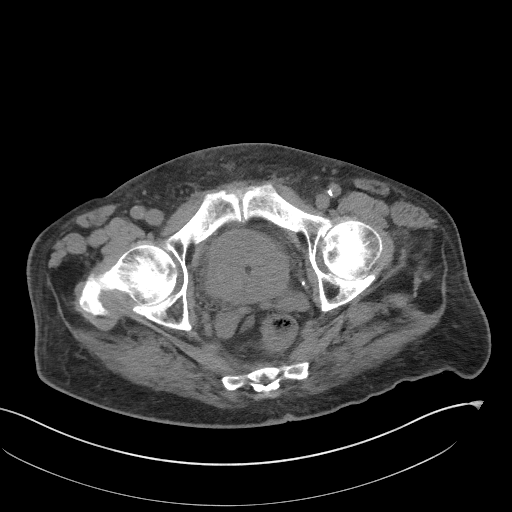
[im 29/97  soft-tissue]
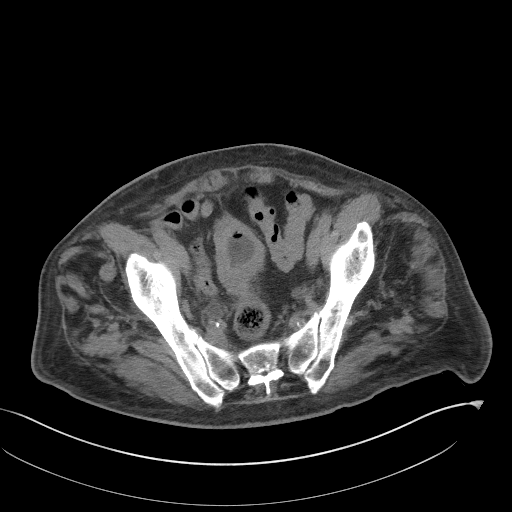
[im 37/97  soft-tissue]
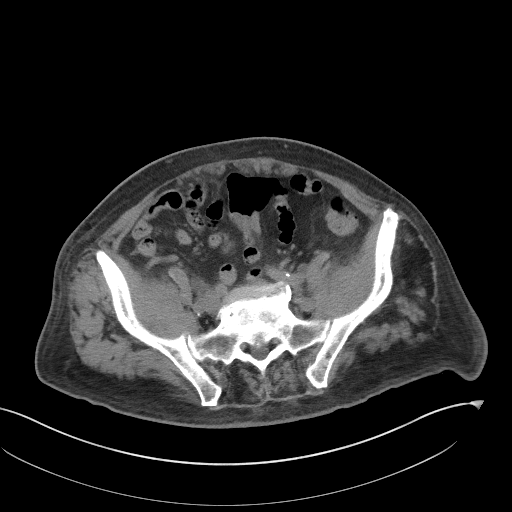
[im 45/97  soft-tissue]
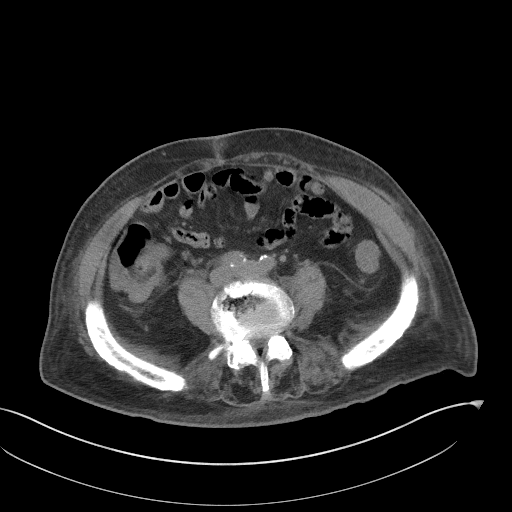
[im 53/97  soft-tissue]
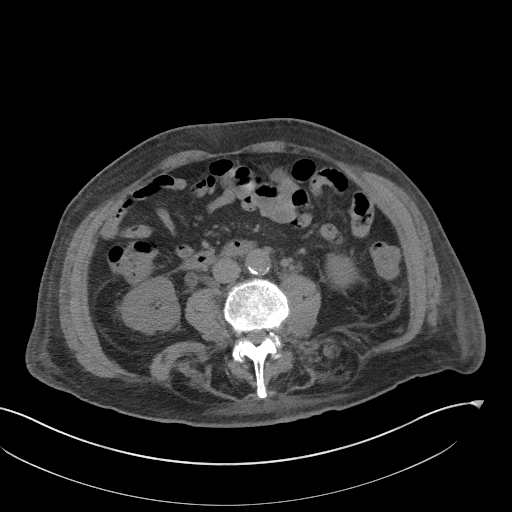
[im 61/97  soft-tissue]
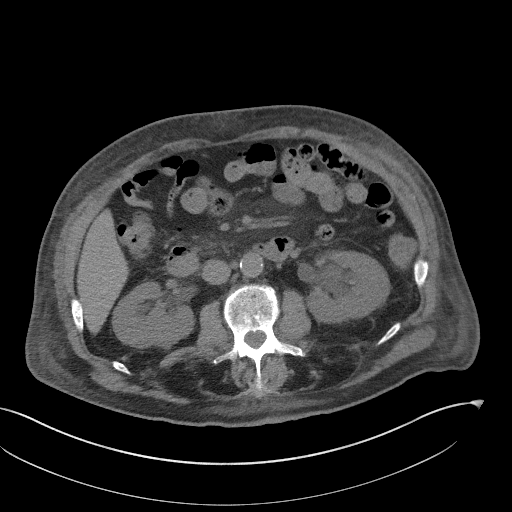
[im 69/97  soft-tissue]
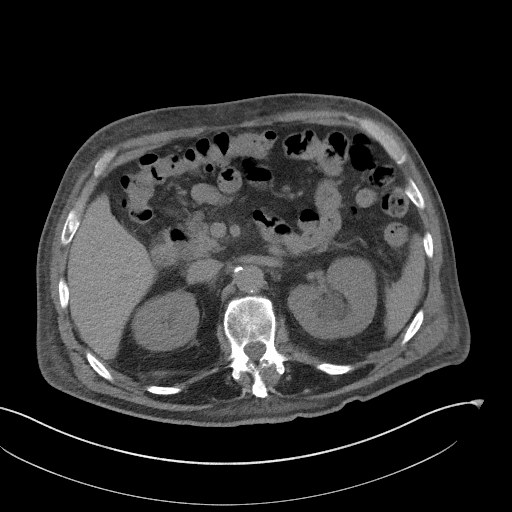
[im 69/97  bone]
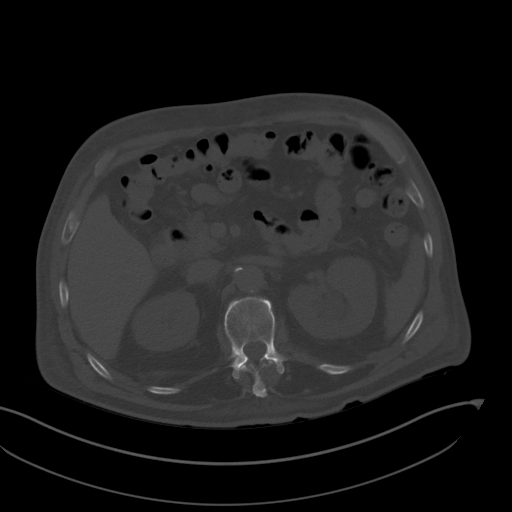
[im 77/97  soft-tissue]
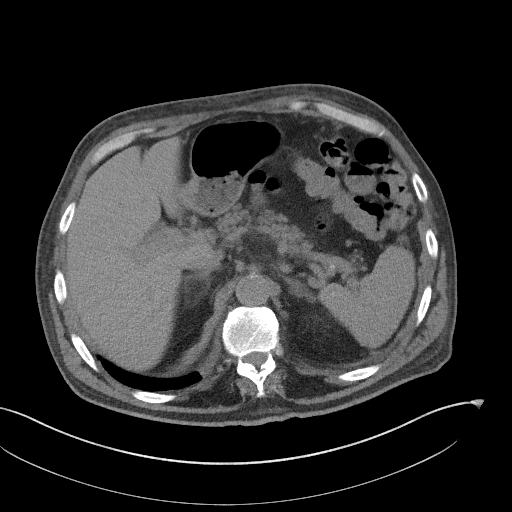
[im 85/97  soft-tissue]
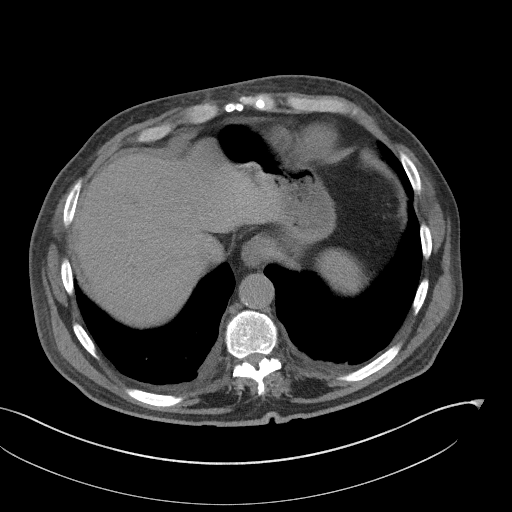
[im 93/97  soft-tissue]
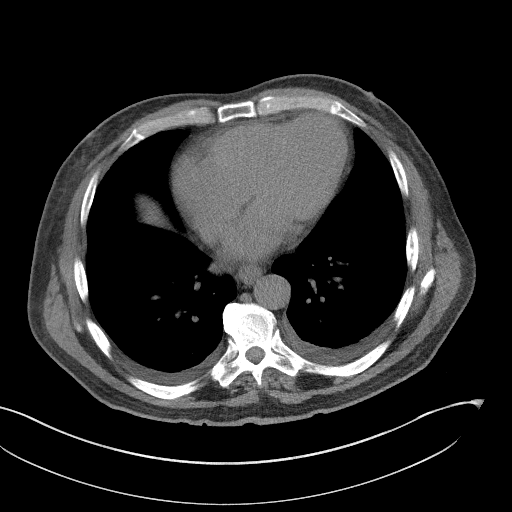

[Series 5: coronal st · coronal · 0.88mm/px · 3 of 91 slices shown]
[im 31/91  soft-tissue]
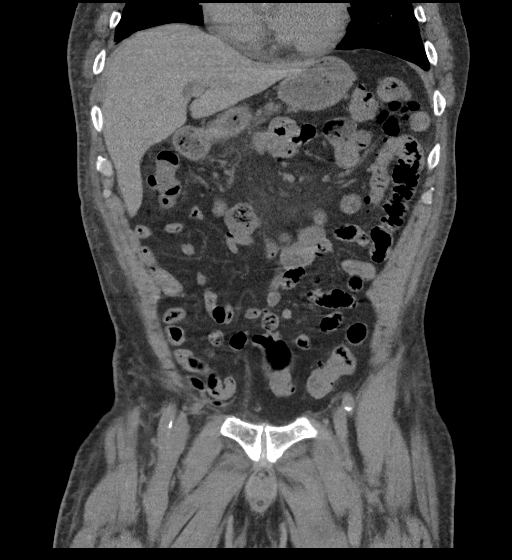
[im 41/91  soft-tissue]
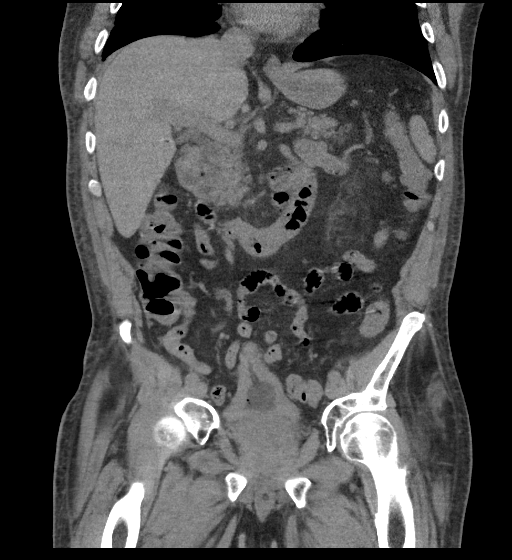
[im 51/91  soft-tissue]
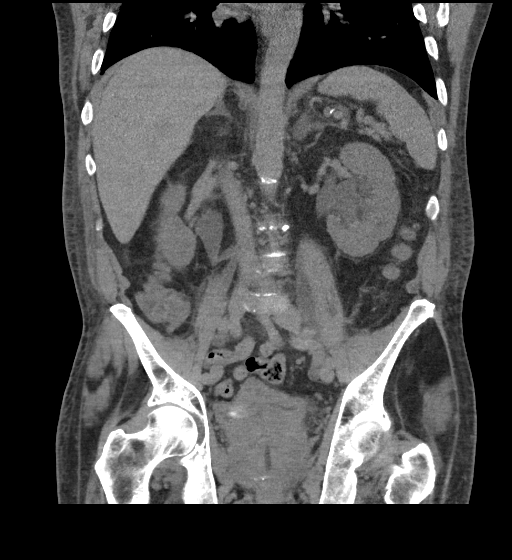

[15 of 46 positions shown; findings below may reference images not displayed]

FINDINGS: Lower chest: Small pleural effusions with minimal basilar
atelectasis. Heart top-normal size. Aortic valve calcification.

Hepatobiliary: Taking into account limitation by non contrast
imaging, no worrisome hepatic lesion. Post cholecystectomy.

Pancreas: Taking into account limitation by non contrast imaging, no
worrisome pancreatic mass.

Spleen: Taking into account limitation by non contrast imaging, no
splenic mass or enlargement.

Adrenals/Urinary Tract: Bilateral moderate to marked
hydroureteronephrosis. 1.8 cm right bladder calculus near the level
of the right ureteral vesicle junction. Prominent bladder wall
thickening. Foley catheter in place with partially decompressed
urinary bladder. Diffuse bladder wall thickening. Significantly
enlarged prostate gland elevating bladder base. Several tiny
nonobstructing left renal calculi. No ureteral obstructing stone
noted.

Taking into account limitation by non contrast imaging, no worrisome
renal mass. Hyperplasia adrenal glands.

Stomach/Bowel: Colonic diverticula. Third spacing of fluid slightly
limits evaluation however, no discrete findings of diverticulitis.
No inflammation surrounds the appendix.

Circumferential mild thickening distal esophagus may be related to
under distension/reflux. Limited for evaluating for mass at this
level.

No gastric abnormality noted. Duodenal diverticulum felt to be
present rather than ulcer.

Vascular/Lymphatic: Atherosclerotic changes aorta and aortic branch
vessels. No abdominal aortic aneurysm.

Scattered normal size lymph nodes.

Reproductive: Markedly enlarged prostate gland elevating the bladder
base.

Other: No free air or bowel containing hernia. Third spacing of
fluid. Fatty atrophy most notable gluteal muscles.

Musculoskeletal: Degenerative changes lumbar spine most notable L4-5
and L5-S1.
IMPRESSION: 1. Moderate-to-marked bilateral hydroureteronephrosis. Cause of this
is indeterminate. No ureteral obstructing stone is noted. Markedly
thickened bladder wall secondary to bladder outlet obstruction
caused by significantly enlarged prostate gland may contribute to
the hydroureteronephrosis. There is a 1.8 cm right bladder calculi
near the right renal vesical junction. Limited with respect to
detecting bladder mass given the degree of contraction. Foley
catheter is in place.
2. Tiny nonobstructing left renal calculi.
3. Third spacing of fluid.  Small pleural effusions.
4. Taking into account third spacing of fluid, no primary bowel
inflammatory process is noted.
5. Circumferential thickening distal esophagus may be related to
under distension/reflux. Limited for evaluating for mass at this
level.
6.  Aortic Atherosclerosis (6FVDW-3BC.C).
7. Post cholecystectomy.

## 2019-09-21 ENCOUNTER — Ambulatory Visit: Payer: Medicare HMO | Admitting: Urology

## 2019-09-28 ENCOUNTER — Ambulatory Visit: Payer: Medicare HMO | Admitting: Urology

## 2019-10-03 ENCOUNTER — Ambulatory Visit
Admission: RE | Admit: 2019-10-03 | Discharge: 2019-10-03 | Disposition: A | Discharge status: Home / Self Care | Payer: Medicare HMO | Source: Ambulatory Visit | Attending: Family Medicine | Admitting: Family Medicine

## 2019-10-03 ENCOUNTER — Other Ambulatory Visit: Payer: Self-pay

## 2019-10-03 ENCOUNTER — Encounter: Payer: Self-pay | Admitting: Family Medicine

## 2019-10-03 ENCOUNTER — Ambulatory Visit (INDEPENDENT_AMBULATORY_CARE_PROVIDER_SITE_OTHER): Payer: Medicare HMO | Admitting: Family Medicine

## 2019-10-03 ENCOUNTER — Ambulatory Visit
Admission: RE | Admit: 2019-10-03 | Discharge: 2019-10-03 | Disposition: A | Discharge status: Home / Self Care | Payer: Medicare HMO | Attending: Family Medicine | Admitting: Family Medicine

## 2019-10-03 VITALS — BP 152/86 | HR 83 | Temp 97.5°F | Wt 173.0 lb

## 2019-10-03 DIAGNOSIS — M79602 Pain in left arm: Secondary | ICD-10-CM | POA: Diagnosis not present

## 2019-10-03 DIAGNOSIS — M19012 Primary osteoarthritis, left shoulder: Secondary | ICD-10-CM | POA: Diagnosis not present

## 2019-10-03 DIAGNOSIS — I10 Essential (primary) hypertension: Secondary | ICD-10-CM | POA: Diagnosis not present

## 2019-10-03 MED ORDER — LOSARTAN POTASSIUM 100 MG PO TABS: 100.0000 mg | ORAL_TABLET | Freq: Every day | ORAL | 0 refills | Status: DC

## 2019-10-03 NOTE — Progress Notes (Signed)
[name]  MRN: [id number] DOB: 1941/11/03  Subjective:  HPI   The patient is an 78 year old male who presents for evaluation of his left arm and neck pain.  He states it is really in his shoulder and neck that hurt more than the arm.  He states it has been present for about 2 weeks and is worse in the morning with movement than at night.  He reports when laying down it does not bother him.  Patient denies any shortness of breath, chest pain or sweats at any time.  He reports he is not currently having any pain.  Patient Active Problem List   Diagnosis Date Noted  . ARF (acute renal failure) ([place]) 01/18/2019  . Closed fracture of shaft of fibula 07/03/2016  . 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 LBP (low back pain) 08/20/2015  . CAFL (chronic airflow limitation) ([place]) 08/20/2015  . Esophageal reflux 08/20/2015  . Cardiac murmur 08/20/2015  . 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 Personal history of methicillin resistant Staphylococcus aureus 08/20/2015  . Essential (primary) hypertension 08/20/2015  . Muscle ache 08/20/2015  . Edema, peripheral 08/20/2015  . Asthma 08/20/2015  . Allergic rhinitis, seasonal 08/20/2015  . 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 Diabetes mellitus, type 2 ([place]) 08/20/2015    Past Medical History:  Diagnosis Date  . Diabetes mellitus without complication ([place])    type 2. has not taken meds in two years   . Heart murmur    has had for years  . Hypertension    Past Surgical History:  Procedure Laterality Date  . CATARACT EXTRACTION, BILATERAL    . CHOLECYSTECTOMY  04/1995  . CYSTOSCOPY WITH LITHOLAPAXY  02/11/2019   Procedure: CYSTOSCOPY WITH LITHOLAPAXY;  Surgeon: Billey Co, MD;  Location: [facility] ORS;  Service: Urology;;  . EYE SURGERY    . HOLEP-LASER ENUCLEATION OF THE PROSTATE WITH MORCELLATION N/A 02/11/2019   Procedure: HOLEP-LASER ENUCLEATION OF THE  PROSTATE WITH MORCELLATION;  Surgeon: Billey Co, MD;  Location: [facility] ORS;  Service: Urology;  Laterality: N/A;  . LIPOMA EXCISION  01/28/2011   Family History  Problem Relation Age of Onset  . Heart attack Brother   . Alzheimer's disease Sister   . Lung cancer Son    Social History   Socioeconomic History  . Marital status: Married    Spouse name: [name]  . Number of children: 5  . Years of education: Not on file  . Highest education level: 10th grade  Occupational History  . Occupation: worked at [employer] for 46 yrs    Comment: retired  Scientific laboratory technician  . Financial resource strain: Not hard at all  . Food insecurity    Worry: Never true    Inability: Never true  . Transportation needs    Medical: No    Non-medical: No  Tobacco Use  . Smoking status: Former Smoker    Packs/day: 1.00    Types: Cigarettes    Quit date: 1956    Years since quitting: 64.8  . Smokeless tobacco: Former Systems developer    Types: Chew    Quit date: 2005  . Tobacco comment: 78 years old for 1 year  Substance and Sexual Activity  . Alcohol use: No    Alcohol/week: 0.0 standard drinks  . Drug use: No  . Sexual activity: Not on file  Lifestyle  . Physical activity  Days per week: 0 days    Minutes per session: 0 min  . Stress: Not at all  Relationships  . Social Herbalist on phone: Patient refused    Gets together: Patient refused    Attends religious service: Patient refused    Active member of club or organization: Patient refused    Attends meetings of clubs or organizations: Patient refused    Relationship status: Patient refused  . Intimate partner violence    Fear of current or ex partner: No    Emotionally abused: No    Physically abused: No    Forced sexual activity: No  Other Topics Concern  . Not on file  Social History Narrative  . Not on file   Outpatient Encounter Medications as of 10/03/2019  Medication Sig  . amLODipine (NORVASC) 10 MG tablet TAKE 1  TABLET BY MOUTH EVERY DAY (Patient taking differently: Take 10 mg by mouth daily. )  . Blood Glucose Monitoring Suppl (ONE TOUCH ULTRA 2) w/Device KIT OneTouch Ultra2 Meter kit  . Cinnamon Bark POWD Take 1 Scoop by mouth daily with breakfast.   . ONETOUCH VERIO test strip TEST FASTING BLOOD SUGAR ONCE DAILY  . [DISCONTINUED] cephALEXin (KEFLEX) 500 MG capsule Take 1 capsule (500 mg total) by mouth 4 (four) times daily.   No facility-administered encounter medications on file as of 10/03/2019.     Allergies  Allergen Reactions  . Lisinopril Cough   Review of Systems  Constitutional: Negative for chills, diaphoresis, fever and malaise/fatigue.  HENT: Negative for congestion, ear pain, sinus pain and sore throat.   Respiratory: Negative for cough and shortness of breath.   Cardiovascular: Negative for chest pain.  Gastrointestinal: Negative for abdominal pain and diarrhea.  Musculoskeletal: Positive for joint pain (shoulder-left) and neck pain. Negative for myalgias.  Neurological: Negative for headaches.    Objective:  BP (!) 170/92 (BP Location: Right Arm, Patient Position: Sitting, Cuff Size: Normal)   Pulse 83   Temp (!) 97.5 F (36.4 C) (Skin)   Wt 173 lb (78.5 kg)   SpO2 97%   BMI 27.10 kg/m    Vitals:   10/03/19 0950 10/03/19 1003  BP: (!) 170/92 (!) 152/86  Pulse: 83   Temp: (!) 97.5 F (36.4 C)   TempSrc: Skin   SpO2: 97%   Weight: 173 lb (78.5 kg)    Physical Exam  Constitutional: He is oriented to person, place, and time and well-developed, well-nourished, and in no distress.  HENT:  Head: Normocephalic.  Eyes: Conjunctivae are normal.  Cardiovascular: Normal rate and regular rhythm.  Pulmonary/Chest: Effort normal and breath sounds normal.  Abdominal: Soft. Bowel sounds are normal.  Musculoskeletal:     Cervical back: Neck supple.     Comments: Walks with a cane due to pain in left lower back and hips from degenerative disease. No pain sitting at rest. No  radiation of pain into legs. SLR's 90 degrees without pain. 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 Slight puffiness left lower leg from healed crush injury with fracture of fibula July 2017. Intermittent pain in the left arm/shoulder and neck. No specific injury noted. Good grip strength bilaterally. Pain occurs when reaching overhead or behind head. No weakness or numbness.  Neurological: He is alert and oriented to person, place, and time.  Skin: No rash noted.  Psychiatric: Mood, affect and judgment normal.    Assessment and Plan :   1. Pain of left  upper extremity Pain in the left arm and neck without dyspnea, diaphoresis or chest pains over the past 2 weeks. No specific injury. States the shoulder and neck pain goes away when he lies down. No specific action recreates or worsens pain. No numbness or swelling. Good strength throughout. Aleve relieves discomfort. EKG with a few PVC's but not acute changes of ischemia. Does notice discomfort is worse in the mornings. Will get x-ray evaluation for suspected arthritis. Continue Aleve BID prn and may apply moist heat or Aspercreme with Lidocaine.  - EKG 12-Lead - DG Shoulder Left  2. Essential (primary) hypertension Has been taking Losartan 100 mg 1/2 tablet qd and Amlodipine 10 mg qd. BP elevated today and should increase Losartan to 100 mg one (1) tablet qd with the Amlodipine. Restrict salt intake and recheck BP in a month.  - losartan (COZAAR) 100 MG tablet; Take 1 tablet (100 mg total) by mouth daily.  Dispense: 90 tablet; Refill: 0

## 2019-10-14 ENCOUNTER — Other Ambulatory Visit: Payer: Self-pay | Admitting: Family Medicine

## 2019-10-14 DIAGNOSIS — I10 Essential (primary) hypertension: Secondary | ICD-10-CM

## 2019-10-24 ENCOUNTER — Ambulatory Visit: Payer: Medicare HMO | Admitting: Urology

## 2019-10-24 ENCOUNTER — Encounter: Payer: Self-pay | Admitting: Urology

## 2019-10-24 ENCOUNTER — Other Ambulatory Visit: Payer: Self-pay

## 2019-10-24 VITALS — BP 176/95 | HR 82 | Ht 68.0 in | Wt 164.0 lb

## 2019-10-24 DIAGNOSIS — N138 Other obstructive and reflux uropathy: Secondary | ICD-10-CM | POA: Diagnosis not present

## 2019-10-24 DIAGNOSIS — N401 Enlarged prostate with lower urinary tract symptoms: Secondary | ICD-10-CM | POA: Diagnosis not present

## 2019-10-24 LAB — BLADDER SCAN AMB NON-IMAGING

## 2019-10-24 NOTE — Progress Notes (Signed)
   10/24/2019 10:52 AM   [name] December 19, 1940 [id number]  Reason for visit: Follow up after HOLEP  HPI: I saw [name] back in urology clinic in follow-up after undergoing an uncomplicated HOLEP on 2/86/7519.  He is an 78 year old male who I originally met as an inpatient who was hospitalized with acute urinary retention, bilateral hydronephrosis, and renal failure with a 164 g prostate on CT and a 2 cm bladder stone.  He underwent uncomplicated HOLEP on 07/24/2997 and has been voiding well since that time.  He denies any complaints today and reports he is voiding with a very strong stream.  He has nocturia 1-2 times per night.  He denies any incontinence, urgency, or frequency.  He is delighted with his urinary symptoms compared to before surgery.  His renal function has returned to normal with her most recent creatinine of 0.7.  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  PVR today is 63m.  Follow-up in 6 months with PVR, if doing well at that time and normal PVR <150 mL can follow-up as needed  A total of 15 minutes were spent face-to-face with the patient, greater than 50% was spent in patient education, counseling, and coordination of care regarding postop expectations after HOLEP.  [facility] [address] [place] [phone]

## 2019-10-28 DIAGNOSIS — R69 Illness, unspecified: Secondary | ICD-10-CM | POA: Diagnosis not present

## 2019-11-28 NOTE — Progress Notes (Addendum)
Subjective:   [name] is a 78 y.o. male who presents for Medicare Annual/Subsequent preventive examination.    This visit is being conducted through telemedicine due to the COVID-19 pandemic. This patient has given me verbal consent via doximity to conduct this visit, patient states they are participating from their home address. Some vital signs may be absent or patient reported.    Patient identification: identified by name, DOB, and current address  Review of Systems:  N/A  Cardiac Risk Factors include: advanced age (>53mn, >>13women);hypertension;male gender     Objective:    Vitals: There were no vitals taken for this visit.  There is no height or weight on file to calculate BMI. Unable to obtain vitals due to visit being conducted via telephonically.   Advanced Directives 11/29/2019 02/07/2019 01/18/2019 01/18/2019 11/26/2018 11/18/2017 11/14/2016  Does Patient Have a Medical Advance Directive? No No No No Yes Yes No  Type of Advance Directive - - - - HPress photographerLiving will Living will -  Copy of [place]? - - - - No - copy requested - -  Would patient like information on creating a medical advance directive? No - Patient declined No - Patient declined No - Patient declined No - Patient declined - - No - Patient declined    Tobacco Social History   Tobacco Use  Smoking Status Former Smoker   Packs/day: 1.00   Types: Cigarettes   Quit date: 1956   Years since quitting: 65.0  Smokeless Tobacco Former USystems developer  Types: Chew   Quit date: 2005  Tobacco Comment   78years old for 1 year     Counseling given: Not Answered Comment: 78years old for 1 year   Clinical Intake:  Pre-visit preparation completed: Yes  Pain : 0-10 Pain Score: 8  Pain Type: Chronic pain Pain Location: Back Pain Orientation: Lower Pain Descriptors / Indicators: Aching Pain Frequency: Constant     Nutritional Risks: None Diabetes:  Yes  How often do you need to have someone help you when you read instructions, pamphlets, or other written materials from your doctor or pharmacy?: 1 - Never   Diabetes:  Is the patient diabetic?  Yes type 2 If diabetic, was a CBG obtained today?  No  Did the patient bring in their glucometer from home?  No  How often do you monitor your CBG's? Once a day.   Financial Strains and Diabetes Management:  Are you having any financial strains with the device, your supplies or your medication? No .  Does the patient want to be seen by Chronic Care Management for management of their diabetes?  No  Would the patient like to be referred to a Nutritionist or for Diabetic Management?  No   Diabetic Exams:  Diabetic Eye Exam: Completed 12/23/18. Repeat yearly.  Diabetic Foot Exam: Completed 08/06/18. Pt has been advised about the importance in completing this exam. Note made to follow up on this at next in office visit.    Interpreter Needed?: No  Information entered by :: Mmarkoski, LPN  Past Medical History:  Diagnosis Date   Di[place]    type 2. has not taken meds in two years    Heart murmur    has had for years   Hypertension    Stone, bladder    Past Surgical History:  Procedure Laterality Date   CATARACT EXTRACTION, BILATERAL     CHOLECYSTECTOMY  04/1995   CYSTOSCOPY WITH LITHOLAPAXY  02/11/2019   Procedure: CYSTOSCOPY WITH LITHOLAPAXY;  Surgeon: Billey Co, MD;  Location: [facility] ORS;  Service: Urology;;   EYE SURGERY     HOLEP-LASER ENUCLEATION OF THE PROSTATE WITH MORCELLATION N/A 02/11/2019   Procedure: HOLEP-LASER ENUCLEATION OF THE PROSTATE WITH MORCELLATION;  Surgeon: Billey Co, MD;  Location: [facility] OR[facility];  Service: Urology;  Laterality: N/A;   LIPOMA EXCISION  01/28/2011   Family History  Problem Relation Age of Onset   Heart attack Brother    Alzheimer's disease Sister    Lung cancer Son    Social History   Socioeconomic  History   Marital status: Married    Spouse name: [name]   Number of children: 5   Years of education: Not on file   Highest education level: 10th grade  Occupational History   Occupation: worked at [employer] for 46 yrs    Comment: retired  Tobacco Use   Smoking status: Former Smoker    Packs/day: 1.00    Types: Cigarettes    Quit date: 1956    Years since quitting: 65.0   Smokeless tobacco: Former Systems developer    Types: Chew    Quit date: 2005   Tobacco comment: 78 years old for 1 year  Substance and Sexual Activity   Alcohol use: No    Alcohol/week: 0.0 standard drinks   Drug use: No   Sexual activity: Not on file  Other Topics Concern   Not on file  Social History Narrative   Not on file   Social Determinants of Health   Financial Resource Strain: Low Risk    Difficulty of Paying Living Expenses: Not hard at all  Food Insecurity: No Food Insecurity   Worried About Charity fundraiser in the Last Year: Never true   [place] in the Last Year: Never true  Transportation Needs: No Transportation Needs   Lack of Transportation (Medical): No   Lack of Transportation (Non-Medical): No  Physical Activity: Inactive   Days of Exercise per Week: 0 days   Minutes of Exercise per Session: 0 min  Stress: No Stress Concern Present   Feeling of Stress : Not at all  Social Connections: Slightly Isolated   Frequency of Communication with Friends and Family: More than three times a week   Frequency of Social Gatherings with Friends and Family: Twice a week   Attends Religious Services: More than 4 times per year   Active Member of [employer] or Organizations: No   Attends Archivist Meetings: Never   Marital Status: Married    Outpatient Encounter Medications as of 11/29/2019  Medication Sig   amLODipine (NORVASC) 10 MG tablet TAKE 1 TABLET BY MOUTH EVERY DAY (Patient taking differently: Take 10 mg by mouth daily. )   Blood Glucose Monitoring Suppl (ONE TOUCH ULTRA  2) w/Device KIT OneTouch Ultra2 Meter kit   Cinnamon Bark POWD Take 1 Scoop by mouth daily with breakfast.    losartan (COZAAR) 100 MG tablet TAKE 1/2 TABLET BY MOUTH DAILY   naproxen sodium (ALEVE) 220 MG tablet Take 220 mg by mouth 2 (two) times daily as needed.   ONETOUCH VERIO test strip TEST FASTING BLOOD SUGAR ONCE DAILY   No facility-administered encounter medications on file as of 11/29/2019.    Activities of Daily Living In your present state of health, do you have any difficulty performing the following activities: 11/29/2019 02/07/2019  Hearing? N N  Vision? N N  Difficulty concentrating or making decisions? N N  Walking or climbing stairs? N N  Dressing or bathing? N N  Doing errands, shopping? N -  Preparing Food and eating ? N -  Using the Toilet? N -  In the past six months, have you accidently leaked urine? N -  Do you have problems with loss of bowel control? N -  Managing your Medications? N -  Managing your Finances? N -  Housekeeping or managing your Housekeeping? N -  Some recent data might be hidden    Patient Care Team: Chrismon, Vickki Muff, PA as PCP - General (Physician Assistant) Odette Fraction as Consulting Physician (Optometry)   Assessment:   This is a routine wellness examination for Darrin.  Exercise Activities and Dietary recommendations Current Exercise Habits: The patient does not participate in regular exercise at present, Exercise limited by: None identified  Goals      DIET - REDUCE PORTION SIZE     Continue cutting back on portion sizes by half and eating 3 small meals a day with two healthy snacks in between.     Exercise 3x per week (30 min per time)     Recommend to start exercising 3 days a week for 30 minutes at a time.     Increase water intake     Starting 11/14/16, I will continue to drink 6-8 glasses of water a day.        Fall Risk Fall Risk  11/29/2019 11/26/2018 11/18/2017 11/14/2016  Falls in the past year? 0 0  No No  Number falls in past yr: 0 0 - -  Injury with Fall? 0 0 - -   FALL RISK PREVENTION PERTAINING TO THE HOME:  Any stairs in or around the home? Yes  If so, are there any without handrails? No   Home free of loose throw rugs in walkways, pet beds, electrical cords, etc? Yes  Adequate lighting in your home to reduce risk of falls? Yes   ASSISTIVE DEVICES UTILIZED TO PREVENT FALLS:  Life alert? No  Use of a cane, walker or w/c? Yes  Grab bars in the bathroom? Yes  Shower chair or bench in shower? Yes  Elevated toilet seat or a handicapped toilet? No    TIMED UP AND GO:  Was the test performed? No .    Depression Screen PHQ 2/9 Scores 11/29/2019 11/29/2019 11/26/2018 11/26/2018  PHQ - 2 Score 0 0 0 0  PHQ- 9 Score - - 0 -    Cognitive Function     6CIT Screen 11/29/2019 11/14/2016  What Year? 0 points 0 points  What month? 0 points 0 points  What time? 0 points 0 points  Count back from 20 0 points 0 points  Months in reverse 0 points 0 points  Repeat phrase 0 points 4 points  Total Score 0 4    Immunization History  Administered Date(s) Administered   Fluad Quad(high Dose 65+) 08/10/2019   Influenza, High Dose Seasonal PF 11/14/2016, 09/17/2018   Influenza, Seasonal, Injecte, Preservative Fre 09/20/2012   Influenza-Unspecified 09/17/2017   Pneumococcal Conjugate-13 11/27/2017   Tdap 06/13/2016    Qualifies for Shingles Vaccine? Yes . Due for Shingrix. Pt has been advised to call insurance company to determine out of pocket expense. Advised may also receive vaccine at local pharmacy or Health Dept. Verbalized acceptance and understanding.  Tdap: Up to date  Flu Vaccine: Up to date  Pneumococcal Vaccine: Due  for Pneumococcal vaccine. Does the patient want to receive this vaccine today?  No .  Advised may receive this vaccine at local pharmacy or Health Dept. Aware to provide a copy of the vaccination record if obtained from local pharmacy or Health Dept.  Verbalized acceptance and understanding.   Screening Tests Health Maintenance  Topic Date Due   PNA vac Low Risk Adult (2 of 2 - PPSV23) 11/27/2018   HEMOGLOBIN A1C  06/06/2019   FOOT EXAM  08/07/2019   OPHTHALMOLOGY EXAM  12/24/2019   TETANUS/TDAP  06/13/2026   INFLUENZA VACCINE  Completed   Cancer Screenings:  Colorectal Screening: No longer required.   Lung Cancer Screening: (Low Dose CT Chest recommended if Age 31-80 years, 30 pack-year currently smoking OR have quit w/in 15years.) does not qualify.   Additional Screening:  Dental Screening: Recommended annual dental exams for proper oral hygiene  Community Resource Referral:  CRR required this visit?  No        Plan:  I have personally reviewed and addressed the Medicare Annual Wellness questionnaire and have noted the following in the patient's chart:  A. Medical and social history B. Use of alcohol, tobacco or illicit drugs  C. Current medications and supplements D. Functional ability and status E.  Nutritional status F.  Physical activity G. Advance directives H. List of other physicians I.  Hospitalizations, surgeries, and ER visits in previous 12 months J.  [place] such as hearing and vision if needed, cognitive and depression L. Referrals and appointments   In addition, I have reviewed and discussed with patient certain preventive protocols, quality metrics, and best practice recommendations. A written personalized care plan for preventive services as well as general preventive health recommendations were provided to patient.   [place] Score, [place]  03/70/4888 Nurse Health Advisor  Nurse Notes: Pt needs a diabetic foot exam, Hgb A1c check and Pneumovax 23 vaccine at next in office apt.   Reviewed documentation of screening by Nurse Health Advisor. Agree with note and recommendations.

## 2019-11-29 ENCOUNTER — Other Ambulatory Visit: Payer: Self-pay

## 2019-11-29 ENCOUNTER — Ambulatory Visit (INDEPENDENT_AMBULATORY_CARE_PROVIDER_SITE_OTHER): Payer: Medicare HMO

## 2019-11-29 DIAGNOSIS — Z0000 Encounter for general adult medical examination without abnormal findings: Secondary | ICD-10-CM | POA: Diagnosis not present

## 2019-11-29 NOTE — Patient Instructions (Signed)
Mr. [name] , Thank you for taking time to come for your Medicare Wellness Visit. I appreciate your ongoing commitment to your health goals. Please review the following plan we discussed and let me know if I can assist you in the future.   Screening recommendations/referrals: Colonoscopy: No longer required.  Recommended yearly ophthalmology/optometry visit for glaucoma screening and checkup Recommended yearly dental visit for hygiene and checkup  Vaccinations: Influenza vaccine: Up to date Pneumococcal vaccine: Pneumovax 23 due. Pt to receive at next in office apt. Tdap vaccine: Up to date, due 05/2026 Shingles vaccine: Pt declines today.     Advanced directives: Advance directive discussed with you today. Even though you declined this today please call our office should you change your mind and we can give you the proper paperwork for you to fill out.  Conditions/risks identified: Continue to work towards eating 3 small meals a day with 2 healthy snacks in between. Recommend to start walking 3 days a week for at least 30 minutes at a time.   Next appointment: 12/13/19 @ 1:20 PM with Simona Huh Chrismon. Declined scheduling an AWV for next year, at this time.   Preventive Care 80 Years and Older, Male Preventive care refers to lifestyle choices and visits with your health care provider that can promote health and wellness. What does preventive care include?  A yearly physical exam. This is also called an annual well check.  Dental exams once or twice a year.  Routine eye exams. Ask your health care provider how often you should have your eyes checked.  Personal lifestyle choices, including:  Daily care of your teeth and gums.  Regular physical activity.  Eating a healthy diet.  Avoiding tobacco and drug use.  Limiting alcohol use.  Practicing safe sex.  Taking low doses of aspirin every day.  Taking vitamin and mineral supplements as recommended by your health care  provider. What happens during an annual well check? The services and screenings done by your health care provider during your annual well check will depend on your age, overall health, lifestyle risk factors, and family history of disease. Counseling  Your health care provider may ask you questions about your:  Alcohol use.  Tobacco use.  Drug use.  Emotional well-being.  Home and relationship well-being.  Sexual activity.  Eating habits.  History of falls.  Memory and ability to understand (cognition).  Work and work Statistician. Screening  You may have the following tests or measurements:  Height, weight, and BMI.  Blood pressure.  Lipid and cholesterol levels. These may be checked every 5 years, or more frequently if you are over 45 years old.  Skin check.  Lung cancer screening. You may have this screening every year starting at age 80 if you have a 30-pack-year history of smoking and currently smoke or have quit within the past 15 years.  Fecal occult blood test (FOBT) of the stool. You may have this test every year starting at age 80.  Flexible sigmoidoscopy or colonoscopy. You may have a sigmoidoscopy every 5 years or a colonoscopy every 10 years starting at age 80.  Prostate cancer screening. Recommendations will vary depending on your family history and other risks.  Hepatitis C blood test.  Hepatitis B blood test.  Sexually transmitted disease (STD) testing.  Diabetes screening. This is done by checking your blood sugar (glucose) after you have not eaten for a while (fasting). You may have this done every 1-3 years.  Abdominal aortic aneurysm (AAA) screening.  You may need this if you are a current or former smoker.  Osteoporosis. You may be screened starting at age 80 if you are at high risk. Talk with your health care provider about your test results, treatment options, and if necessary, the need for more tests. Vaccines  Your health care provider  may recommend certain vaccines, such as:  Influenza vaccine. This is recommended every year.  Tetanus, diphtheria, and acellular pertussis (Tdap, Td) vaccine. You may need a Td booster every 10 years.  Zoster vaccine. You may need this after age 80.  Pneumococcal 13-valent conjugate (PCV13) vaccine. One dose is recommended after age 80.  Pneumococcal polysaccharide (PPSV23) vaccine. One dose is recommended after age 80. Talk to your health care provider about which screenings and vaccines you need and how often you need them. This information is not intended to replace advice given to you by your health care provider. Make sure you discuss any questions you have with your health care provider. Document Released: 12/14/2015 Document Revised: 08/06/2016 Document Reviewed: 09/18/2015 Elsevier Interactive Patient Education  2017 [place] Prevention in the Home Falls can cause injuries. They can happen to people of all ages. There are many things you can do to make your home safe and to help prevent falls. What can I do on the outside of my home?  Regularly fix the edges of walkways and driveways and fix any cracks.  Remove anything that might make you trip as you walk through a door, such as a raised step or threshold.  Trim any bushes or trees on the path to your home.  Use bright outdoor lighting.  Clear any walking paths of anything that might make someone trip, such as rocks or tools.  Regularly check to see if handrails are loose or broken. Make sure that both sides of any steps have handrails.  Any raised decks and porches should have guardrails on the edges.  Have any leaves, snow, or ice cleared regularly.  Use sand or salt on walking paths during winter.  Clean up any spills in your garage right away. This includes oil or grease spills. What can I do in the bathroom?  Use night lights.  Install grab bars by the toilet and in the tub and shower. Do not use  towel bars as grab bars.  Use non-skid mats or decals in the tub or shower.  If you need to sit down in the shower, use a plastic, non-slip stool.  Keep the floor dry. Clean up any water that spills on the floor as soon as it happens.  Remove soap buildup in the tub or shower regularly.  Attach bath mats securely with double-sided non-slip rug tape.  Do not have throw rugs and other things on the floor that can make you trip. What can I do in the bedroom?  Use night lights.  Make sure that you have a light by your bed that is easy to reach.  Do not use any sheets or blankets that are too big for your bed. They should not hang down onto the floor.  Have a firm chair that has side arms. You can use this for support while you get dressed.  Do not have throw rugs and other things on the floor that can make you trip. What can I do in the kitchen?  Clean up any spills right away.  Avoid walking on wet floors.  Keep items that you use a lot in easy-to-reach places.  If you need to reach something above you, use a strong step stool that has a grab bar.  Keep electrical cords out of the way.  Do not use floor polish or wax that makes floors slippery. If you must use wax, use non-skid floor wax.  Do not have throw rugs and other things on the floor that can make you trip. What can I do with my stairs?  Do not leave any items on the stairs.  Make sure that there are handrails on both sides of the stairs and use them. Fix handrails that are broken or loose. Make sure that handrails are as long as the stairways.  Check any carpeting to make sure that it is firmly attached to the stairs. Fix any carpet that is loose or worn.  Avoid having throw rugs at the top or bottom of the stairs. If you do have throw rugs, attach them to the floor with carpet tape.  Make sure that you have a light switch at the top of the stairs and the bottom of the stairs. If you do not have them, ask  someone to add them for you. What else can I do to help prevent falls?  Wear shoes that:  Do not have high heels.  Have rubber bottoms.  Are comfortable and fit you well.  Are closed at the toe. Do not wear sandals.  If you use a stepladder:  Make sure that it is fully opened. Do not climb a closed stepladder.  Make sure that both sides of the stepladder are locked into place.  Ask someone to hold it for you, if possible.  Clearly mark and make sure that you can see:  Any grab bars or handrails.  First and last steps.  Where the edge of each step is.  Use tools that help you move around (mobility aids) if they are needed. These include:  Canes.  Walkers.  Scooters.  Crutches.  Turn on the lights when you go into a dark area. Replace any light bulbs as soon as they burn out.  Set up your furniture so you have a clear path. Avoid moving your furniture around.  If any of your floors are uneven, fix them.  If there are any pets around you, be aware of where they are.  Review your medicines with your doctor. Some medicines can make you feel dizzy. This can increase your chance of falling. Ask your doctor what other things that you can do to help prevent falls. This information is not intended to replace advice given to you by your health care provider. Make sure you discuss any questions you have with your health care provider. Document Released: 09/13/2009 Document Revised: 04/24/2016 Document Reviewed: 12/22/2014 Elsevier Interactive Patient Education  2017 [employer].

## 2019-12-12 DIAGNOSIS — H524 Presbyopia: Secondary | ICD-10-CM | POA: Diagnosis not present

## 2019-12-12 LAB — HM DIABETES EYE EXAM

## 2019-12-13 ENCOUNTER — Encounter: Payer: Medicare HMO | Admitting: Family Medicine

## 2020-01-05 ENCOUNTER — Other Ambulatory Visit: Payer: Self-pay

## 2020-01-05 ENCOUNTER — Ambulatory Visit (INDEPENDENT_AMBULATORY_CARE_PROVIDER_SITE_OTHER): Payer: Medicare HMO | Admitting: Family Medicine

## 2020-01-05 ENCOUNTER — Encounter: Payer: Self-pay | Admitting: Family Medicine

## 2020-01-05 VITALS — BP 122/82 | HR 80 | Temp 97.3°F | Wt 175.0 lb

## 2020-01-05 DIAGNOSIS — N401 Enlarged prostate with lower urinary tract symptoms: Secondary | ICD-10-CM

## 2020-01-05 DIAGNOSIS — I10 Essential (primary) hypertension: Secondary | ICD-10-CM

## 2020-01-05 DIAGNOSIS — E1100 Type 2 diabetes mellitus with hyperosmolarity without nonketotic hyperglycemic-hyperosmolar coma (NKHHC): Secondary | ICD-10-CM

## 2020-01-05 DIAGNOSIS — E786 Lipoprotein deficiency: Secondary | ICD-10-CM

## 2020-01-05 DIAGNOSIS — N138 Other obstructive and reflux uropathy: Secondary | ICD-10-CM

## 2020-01-05 DIAGNOSIS — M4726 Other spondylosis with radiculopathy, lumbar region: Secondary | ICD-10-CM | POA: Diagnosis not present

## 2020-01-05 NOTE — Progress Notes (Signed)
Patient: [name], Male    DOB: [birth year], 79 y.o.   MRN: [id number] Visit Date: 01/05/2020  Today's Provider: [name], PA   Chief Complaint  Patient presents with  . Annual Exam   Subjective:  [name] is a 79 y.o. male who presents today for health maintenance and complete physical. He feels well. He reports exercising daily. He reports he is sleeping well.   Review of Systems  Constitutional: Negative.   HENT: Negative.   Eyes: Negative.   Respiratory: Negative.   Cardiovascular: Negative.   Gastrointestinal: Negative.   Endocrine: Negative.   Genitourinary: Negative.   Musculoskeletal: Positive for arthralgias and back pain.  Skin: Negative.   Allergic/Immunologic: Negative.   Neurological: Negative.   Hematological: Negative.   Psychiatric/Behavioral: Negative.     Social History   Socioeconomic History  . Marital status: Married    Spouse name: [name]  . Number of children: 5  . Years of education: Not on file  . Highest education level: 10th grade  Occupational History  . Occupation: worked at [employer] for 46 yrs    Comment: retired  Tobacco Use  . Smoking status: Former Smoker    Packs/day: 1.00    Types: Cigarettes    Quit date: 1956    Years since quitting: 65.1  . Smokeless tobacco: Former Systems developer    Types: Chew    Quit date: 2005  . Tobacco comment: 79 years old for 1 year  Substance and Sexual Activity  . Alcohol use: No    Alcohol/week: 0.0 standard drinks  . Drug use: No  . Sexual activity: Not on file  Other Topics Concern  . Not on file  Social History Narrative  . Not on file   Social Determinants of Health   Financial Resource Strain: Low Risk   . Difficulty of Paying Living Expenses: Not hard at all  Food Insecurity: No Food Insecurity  . Worried About Charity fundraiser in the Last Year: Never true  . Ran Out of Food in the Last Year: Never true  Transportation Needs: No Transportation Needs  . Lack of  Transportation (Medical): No  . Lack of Transportation (Non-Medical): No  Physical Activity: Inactive  . Days of Exercise per Week: 0 days  . Minutes of Exercise per Session: 0 min  Stress: No Stress Concern Present  . Feeling of Stress : Not at all  Social Connections: Slightly Isolated  . Frequency of Communication with Friends and Family: More than three times a week  . Frequency of Social Gatherings with Friends and Family: Twice a week  . Attends Religious Services: More than 4 times per year  . Active Member of Clubs or Organizations: No  . Attends Archivist Meetings: Never  . Marital Status: Married  Human resources officer Violence: Not At Risk  . Fear of Current or Ex-Partner: No  . Emotionally Abused: No  . Physically Abused: No  . Sexually Abused: No    Patient Active Problem List   Diagnosis Date Noted  . ARF (acute renal failure) ([place]) 01/18/2019  . Closed fracture of shaft of fibula 07/03/2016  . 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 LBP (low back pain) 08/20/2015  . CAFL (chronic airflow limitation) ([place]) 08/20/2015  . Esophageal reflux 08/20/2015  . Cardiac murmur 08/20/2015  . 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 Personal history of methicillin resistant Staphylococcus aureus 08/20/2015  . Essential (primary)  hypertension 08/20/2015  . Muscle ache 08/20/2015  . Edema, peripheral 08/20/2015  . Asthma 08/20/2015  . Allergic rhinitis, seasonal 08/20/2015  . 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 Diabetes mellitus, type 2 ([place]) 08/20/2015    Past Surgical History:  Procedure Laterality Date  . CATARACT EXTRACTION, BILATERAL    . CHOLECYSTECTOMY  04/1995  . CYSTOSCOPY WITH LITHOLAPAXY  02/11/2019   Procedure: CYSTOSCOPY WITH LITHOLAPAXY;  Surgeon: Billey Co, MD;  Location: [facility] ORS;  Service: Urology;;  . EYE SURGERY    . HOLEP-LASER ENUCLEATION OF THE PROSTATE WITH  MORCELLATION N/A 02/11/2019   Procedure: HOLEP-LASER ENUCLEATION OF THE PROSTATE WITH MORCELLATION;  Surgeon: Billey Co, MD;  Location: [facility] ORS;  Service: Urology;  Laterality: N/A;  . LIPOMA EXCISION  01/28/2011    His family history includes Alzheimer's disease in his sister; Heart attack in his brother; Lung cancer in his son.     Outpatient Encounter Medications as of 01/05/2020  Medication Sig  . amLODipine (NORVASC) 10 MG tablet TAKE 1 TABLET BY MOUTH EVERY DAY (Patient taking differently: Take 10 mg by mouth daily. )  . Blood Glucose Monitoring Suppl (ONE TOUCH ULTRA 2) w/Device KIT OneTouch Ultra2 Meter kit  . Cinnamon Bark POWD Take 1 Scoop by mouth daily with breakfast.   . losartan (COZAAR) 100 MG tablet TAKE 1/2 TABLET BY MOUTH DAILY  . naproxen sodium (ALEVE) 220 MG tablet Take 220 mg by mouth 2 (two) times daily as needed.  Glory Rosebush VERIO test strip TEST FASTING BLOOD SUGAR ONCE DAILY   No facility-administered encounter medications on file as of 01/05/2020.    Patient Care Team: Chrismon, Vickki Muff, PA as PCP - General (Physician Assistant) Odette Fraction as Consulting Physician (Optometry)      Objective:   Vitals:  Vitals:   01/05/20 1327  BP: 122/82  Pulse: 80  Temp: (!) 97.3 F (36.3 C)  TempSrc: Skin  SpO2: 98%  Weight: 175 lb (79.4 kg)   Wt Readings from Last 3 Encounters:  01/05/20 175 lb (79.4 kg)  10/24/19 164 lb (74.4 kg)  10/03/19 173 lb (78.5 kg)    Physical Exam Constitutional:      Appearance: He is well-developed.  HENT:     Head: Normocephalic and atraumatic.     Right Ear: External ear normal.     Left Ear: External ear normal.     Nose: Nose normal.  Eyes:     General:        Right eye: No discharge.     Conjunctiva/sclera: Conjunctivae normal.     Pupils: Pupils are equal, round, and reactive to light.  Neck:     Thyroid: No thyromegaly.     Trachea: No tracheal deviation.  Cardiovascular:     Rate and Rhythm: Normal  rate and regular rhythm.     Heart sounds: Normal heart sounds. No murmur.  Pulmonary:     Effort: Pulmonary effort is normal. No respiratory distress.     Breath sounds: Normal breath sounds. No wheezing or rales.  Chest:     Chest wall: No tenderness.  Abdominal:     General: There is no distension.     Palpations: Abdomen is soft. There is no mass.     Tenderness: There is no abdominal tenderness. There is no guarding or rebound.  Musculoskeletal:        General: No tenderness. Normal range of motion.     Cervical  back: Normal range of motion and neck supple.  Lymphadenopathy:     Cervical: No cervical adenopathy.  Skin:    General: Skin is warm and dry.     Findings: No erythema or rash.  Neurological:     Mental Status: He is alert and oriented to person, place, and time.     Cranial Nerves: No cranial nerve deficit.     Motor: No abnormal muscle tone.     Coordination: Coordination normal.     Gait: Gait abnormal.     Deep Tendon Reflexes: Reflexes are normal and symmetric. Reflexes normal.  Psychiatric:        Behavior: Behavior normal.        Thought Content: Thought content normal.        Judgment: Judgment normal.     Depression Screen PHQ 2/9 Scores 11/29/2019 11/29/2019 11/26/2018 11/26/2018  PHQ - 2 Score 0 0 0 0  PHQ- 9 Score - - 0 -      Assessment & Plan:     Routine Health Maintenance and Physical Exam  Exercise Activities and Dietary recommendations Goals    . DIET - REDUCE PORTION SIZE     Continue cutting back on portion sizes by half and eating 3 small meals a day with two healthy snacks in between.    . Exercise 3x per week (30 min per time)     Recommend to start exercising 3 days a week for 30 minutes at a time.    . Increase water intake     Starting 11/14/16, I will continue to drink 6-8 glasses of water a day.       Immunization History  Administered Date(s) Administered  . Fluad Quad(high Dose 65+) 08/10/2019  . Influenza,  High Dose Seasonal PF 11/14/2016, 09/17/2018  . Influenza, Seasonal, Injecte, Preservative Fre 09/20/2012  . Influenza-Unspecified 09/17/2017  . Pneumococcal Conjugate-13 11/27/2017  . Tdap 06/13/2016    Health Maintenance  Topic Date Due  . PNA vac Low Risk Adult (2 of 2 - PPSV23) 11/27/2018  . HEMOGLOBIN A1C  06/06/2019  . FOOT EXAM  08/07/2019  . OPHTHALMOLOGY EXAM  12/11/2020  . TETANUS/TDAP  06/13/2026  . INFLUENZA VACCINE  Completed     Discussed health benefits of physical activity, and encouraged him to engage in regular exercise appropriate for his age and condition.   1. Type 2 diabetes mellitus with hyperosmolarity without coma, without long-term current use of insulin (HCC) Diet controlled FBS in the 80-96 range recently. No hypoglycemic episodes. Had last eye exam January 2021. Normal foot exam and presently on ARB. Recheck routine labs. - Hemoglobin A1c - CBC with Differential/Platelet - Comprehensive metabolic panel - Lipid Panel With LDL/HDL Ratio  2. Essential (primary) hypertension             Well controlled with Amlodipine 10 mg qd with                        andkeeping weight well controlled. Recheck CBC,             LipidPanel and CMP. Follow up pending reports. - CBC with Differential/Platelet - Comprehensive metabolic panel - Lipid Panel With LDL/HDL Ratio - TSH  3. Low HDL (under 40) HDL below 39 on 12-07-18. Not on any statins. Trying to follow low fat diet. Recheck CMP, TSH and Lipid Panel. - Comprehensive metabolic panel - Lipid Panel With LDL/HDL Ratio - TSH  4. BPH with urinary  obstruction Had urinary retention with 164 gm prostate on CT with pathology report of BPH requiring HOLEP and cystolitholapaxy on 02-11-19 by Dr. Diamantina Providence (urologist). Having normal stream with nocturia only 2 times a night now. Recheck labs to recheck renal function and PSA. - CBC with Differential/Platelet - Comprehensive metabolic panel - PSA  5. Osteoarthritis of  spine with radiculopathy, lumbar region Intermittent bilateral hip pains and low back pain with osteoarthritis documented on x-rays in 2016. Uses Aleve BID now with moderate relief. 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 Recheck prn.

## 2020-01-06 DIAGNOSIS — N401 Enlarged prostate with lower urinary tract symptoms: Secondary | ICD-10-CM | POA: Diagnosis not present

## 2020-01-06 DIAGNOSIS — I10 Essential (primary) hypertension: Secondary | ICD-10-CM | POA: Diagnosis not present

## 2020-01-06 DIAGNOSIS — E786 Lipoprotein deficiency: Secondary | ICD-10-CM | POA: Diagnosis not present

## 2020-01-06 DIAGNOSIS — E1100 Type 2 diabetes mellitus with hyperosmolarity without nonketotic hyperglycemic-hyperosmolar coma (NKHHC): Secondary | ICD-10-CM | POA: Diagnosis not present

## 2020-01-06 DIAGNOSIS — M4726 Other spondylosis with radiculopathy, lumbar region: Secondary | ICD-10-CM | POA: Diagnosis not present

## 2020-01-06 DIAGNOSIS — N138 Other obstructive and reflux uropathy: Secondary | ICD-10-CM | POA: Diagnosis not present

## 2020-01-06 LAB — POCT UA - MICROALBUMIN: Microalbumin Ur, POC: NEGATIVE mg/L

## 2020-01-06 NOTE — Addendum Note (Signed)
Addended by: Julieta Bellini on: 01/06/2020 10:47 AM   Modules accepted: Orders

## 2020-01-07 LAB — CBC WITH DIFFERENTIAL/PLATELET
Basophils Absolute: 0.1 10*3/uL (ref 0.0–0.2)
Basos: 1 %
EOS (ABSOLUTE): 0.1 10*3/uL (ref 0.0–0.4)
Eos: 2 %
Hematocrit: 46.8 % (ref 37.5–51.0)
Hemoglobin: 15.5 g/dL (ref 13.0–17.7)
Immature Grans (Abs): 0 10*3/uL (ref 0.0–0.1)
Immature Granulocytes: 0 %
Lymphocytes Absolute: 1.8 10*3/uL (ref 0.7–3.1)
Lymphs: 28 %
MCH: 30.5 pg (ref 26.6–33.0)
MCHC: 33.1 g/dL (ref 31.5–35.7)
MCV: 92 fL (ref 79–97)
Monocytes Absolute: 0.5 10*3/uL (ref 0.1–0.9)
Monocytes: 7 %
Neutrophils Absolute: 4 10*3/uL (ref 1.4–7.0)
Neutrophils: 62 %
Platelets: 202 10*3/uL (ref 150–450)
RBC: 5.08 x10E6/uL (ref 4.14–5.80)
RDW: 12.2 % (ref 11.6–15.4)
WBC: 6.5 10*3/uL (ref 3.4–10.8)

## 2020-01-07 LAB — COMPREHENSIVE METABOLIC PANEL
ALT: 18 IU/L (ref 0–44)
AST: 16 IU/L (ref 0–40)
Albumin/Globulin Ratio: 2 (ref 1.2–2.2)
Albumin: 4 g/dL (ref 3.7–4.7)
Alkaline Phosphatase: 71 IU/L (ref 39–117)
BUN/Creatinine Ratio: 38 — ABNORMAL HIGH (ref 10–24)
BUN: 27 mg/dL (ref 8–27)
Bilirubin Total: 0.7 mg/dL (ref 0.0–1.2)
CO2: 23 mmol/L (ref 20–29)
Calcium: 8.6 mg/dL (ref 8.6–10.2)
Chloride: 103 mmol/L (ref 96–106)
Creatinine, Ser: 0.71 mg/dL — ABNORMAL LOW (ref 0.76–1.27)
GFR calc Af Amer: 102 mL/min/{1.73_m2} (ref >59)
GFR calc non Af Amer: 89 mL/min/{1.73_m2} (ref >59)
Globulin, Total: 2 g/dL (ref 1.5–4.5)
Glucose: 85 mg/dL (ref 65–99)
Potassium: 4.2 mmol/L (ref 3.5–5.2)
Sodium: 140 mmol/L (ref 134–144)
Total Protein: 6 g/dL (ref 6.0–8.5)

## 2020-01-07 LAB — LIPID PANEL WITH LDL/HDL RATIO
Cholesterol, Total: 91 mg/dL — ABNORMAL LOW (ref 100–199)
HDL: 38 mg/dL — ABNORMAL LOW (ref >39)
LDL Chol Calc (NIH): 40 mg/dL (ref 0–99)
LDL/HDL Ratio: 1.1 ratio (ref 0.0–3.6)
Triglycerides: 50 mg/dL (ref 0–149)
VLDL Cholesterol Cal: 13 mg/dL (ref 5–40)

## 2020-01-07 LAB — PSA: Prostate Specific Ag, Serum: 2 ng/mL (ref 0.0–4.0)

## 2020-01-07 LAB — HEMOGLOBIN A1C
Est. average glucose Bld gHb Est-mCnc: 91 mg/dL
Hgb A1c MFr Bld: 4.8 % (ref 4.8–5.6)

## 2020-01-07 LAB — TSH: TSH: 2.06 u[IU]/mL (ref 0.450–4.500)

## 2020-01-09 ENCOUNTER — Other Ambulatory Visit: Payer: Self-pay

## 2020-01-09 ENCOUNTER — Other Ambulatory Visit: Payer: Self-pay | Admitting: Family Medicine

## 2020-01-09 MED ORDER — CONTOUR NEXT TEST VI STRP: ORAL_STRIP | 12 refills | Status: DC

## 2020-01-09 MED ORDER — ONETOUCH VERIO VI STRP: ORAL_STRIP | 12 refills | Status: DC

## 2020-01-09 NOTE — Progress Notes (Unsigned)
ravastatin  

## 2020-01-10 ENCOUNTER — Telehealth: Payer: Self-pay | Admitting: Family Medicine

## 2020-01-10 NOTE — Telephone Encounter (Signed)
Pt was given contour meter and he will have to pay 45.00 for testing strips. Pt would like to see if office has one touch meter and he does not have to pay for testing strips

## 2020-01-11 NOTE — Telephone Encounter (Signed)
Meter place at front desk for patient and patient has been notified.

## 2020-01-17 ENCOUNTER — Other Ambulatory Visit: Payer: Self-pay | Admitting: Family Medicine

## 2020-01-17 DIAGNOSIS — I10 Essential (primary) hypertension: Secondary | ICD-10-CM

## 2020-01-24 DIAGNOSIS — G8929 Other chronic pain: Secondary | ICD-10-CM | POA: Diagnosis not present

## 2020-01-24 DIAGNOSIS — Z6829 Body mass index (BMI) 29.0-29.9, adult: Secondary | ICD-10-CM | POA: Diagnosis not present

## 2020-01-24 DIAGNOSIS — M1990 Unspecified osteoarthritis, unspecified site: Secondary | ICD-10-CM | POA: Diagnosis not present

## 2020-01-24 DIAGNOSIS — Z833 Family history of diabetes mellitus: Secondary | ICD-10-CM | POA: Diagnosis not present

## 2020-01-24 DIAGNOSIS — E663 Overweight: Secondary | ICD-10-CM | POA: Diagnosis not present

## 2020-01-24 DIAGNOSIS — Z7982 Long term (current) use of aspirin: Secondary | ICD-10-CM | POA: Diagnosis not present

## 2020-01-24 DIAGNOSIS — Z8249 Family history of ischemic heart disease and other diseases of the circulatory system: Secondary | ICD-10-CM | POA: Diagnosis not present

## 2020-01-24 DIAGNOSIS — Z791 Long term (current) use of non-steroidal anti-inflammatories (NSAID): Secondary | ICD-10-CM | POA: Diagnosis not present

## 2020-01-24 DIAGNOSIS — I10 Essential (primary) hypertension: Secondary | ICD-10-CM | POA: Diagnosis not present

## 2020-01-24 DIAGNOSIS — E119 Type 2 diabetes mellitus without complications: Secondary | ICD-10-CM | POA: Diagnosis not present

## 2020-04-24 ENCOUNTER — Ambulatory Visit: Payer: Medicare HMO | Admitting: Urology

## 2020-05-01 ENCOUNTER — Ambulatory Visit: Payer: Medicare HMO | Admitting: Urology

## 2020-05-25 ENCOUNTER — Ambulatory Visit (INDEPENDENT_AMBULATORY_CARE_PROVIDER_SITE_OTHER): Payer: Medicare HMO | Admitting: Family Medicine

## 2020-05-25 ENCOUNTER — Encounter: Payer: Self-pay | Admitting: Family Medicine

## 2020-05-25 ENCOUNTER — Other Ambulatory Visit: Payer: Self-pay

## 2020-05-25 VITALS — BP 147/81 | HR 81 | Temp 97.5°F | Resp 18 | Ht 68.0 in | Wt 169.0 lb

## 2020-05-25 DIAGNOSIS — E1100 Type 2 diabetes mellitus with hyperosmolarity without nonketotic hyperglycemic-hyperosmolar coma (NKHHC): Secondary | ICD-10-CM | POA: Diagnosis not present

## 2020-05-25 DIAGNOSIS — I10 Essential (primary) hypertension: Secondary | ICD-10-CM

## 2020-05-25 DIAGNOSIS — E786 Lipoprotein deficiency: Secondary | ICD-10-CM | POA: Diagnosis not present

## 2020-05-25 NOTE — Progress Notes (Signed)
Established patient visit  I,April Miller,acting as a scribe for [employer], [employer].,have documented all relevant documentation on the behalf of [employer], [employer],as directed by  [employer], [employer] while in the presence of [employer], [place].   Patient: [name]   DOB: 1941/02/17   79 y.o. Male  MRN: [id number] Visit Date: 05/25/2020  Today's healthcare provider: [name], PA   Chief Complaint  Patient presents with   Follow-up   Diabetes   Hypertension   Hyperlipidemia   Subjective    HPI Diabetes Mellitus Type II, follow-up  Lab Results  Component Value Date   HGBA1C 4.8 01/06/2020   HGBA1C 5.0 12/06/2018   HGBA1C 5.0 12/06/2018   HGBA1C 5.0 (A) 12/06/2018   HGBA1C 5.0 12/06/2018   Last seen for diabetes 4 months ago.  Management since then includes continuing the same treatment. He reports good compliance with treatment. He is not having side effects. none  Home blood sugar records: fasting range: not checking  Episodes of hypoglycemia? No none   Current insulin regiment: n/a Most Recent Eye Exam: 12/12/19  --------------------------------------------------------------------  Hypertension, follow-up  BP Readings from Last 3 Encounters:  05/25/20 (!) 147/81  01/05/20 122/82  10/24/19 (!) 176/95   Wt Readings from Last 3 Encounters:  05/25/20 169 lb (76.7 kg)  01/05/20 175 lb (79.4 kg)  10/24/19 164 lb (74.4 kg)     He was last seen for hypertension 4 months ago.  BP at that visit was 122/82. Management since that visit includes; controlled. He reports good compliance with treatment. He is not having side effects. none He is not exercising. He is adherent to low salt diet.   Outside blood pressures are 137/82.  He does not smoke.  Use of agents associated with hypertension: none.   --------------------------------------------------------------------  Lipid/Cholesterol, follow-up  Last Lipid Panel: Lab Results   Component Value Date   CHOL 91 (L) 01/06/2020   LDLCALC 40 01/06/2020   HDL 38 (L) 01/06/2020   TRIG 50 01/06/2020    He was last seen for this 4 months ago.  Management since that visit includes; labs checked showing-advised statin?. He reports good compliance with treatment. He is not having side effects. none He is following a Regular diet. Current exercise: none  Last metabolic panel Lab Results  Component Value Date   GLUCOSE 85 01/06/2020   NA 140 01/06/2020   K 4.2 01/06/2020   BUN 27 01/06/2020   CREATININE 0.71 (L) 01/06/2020   GFRNONAA 89 01/06/2020   GFRAA 102 01/06/2020   CALCIUM 8.6 01/06/2020   AST 16 01/06/2020   ALT 18 01/06/2020   The ASCVD Risk score Mikey Bussing DC Jr., et al., 2013) failed to calculate for the following reasons:   The 2013 ASCVD risk score is only valid for ages 82 to 21  --------------------------------------------------------------------   Past Medical History:  Diagnosis Date   Diabetes mellitus without complication ([place])    type 2. has not taken meds in two years    Heart murmur    has had for years   Hypertension    Stone, bladder    Social History   Tobacco Use   Smoking status: Former Smoker    Packs/day: 1.00    Types: Cigarettes    Quit date: 1956    Years since quitting: 65.5   Smokeless tobacco: Former Systems developer    Types: [place] date: 2005   Tobacco comment: 79 years  old for 1 year  Vaping Use   Vaping Use: Never used  Substance Use Topics   Alcohol use: No    Alcohol/week: 0.0 standard drinks   Drug use: No   Family Status  Relation Name Status   Mother  Deceased at age 1       unsure   Father  Deceased at age 36       unsure   Sister 42 Alive   Brother 1 Deceased   Sister 2 Alive   Sister 3 Deceased   Sister 87 Alive   Sister 36 Alive   Brother 2 Alive   Brother 3 Alive   Brother 51 Alive   Son  Alive   Son  Alive   Son  Alive   Son  Alive   Allergies  Allergen Reactions   Lisinopril Cough        Medications: Outpatient Medications Prior to Visit  Medication Sig   amLODipine (NORVASC) 10 MG tablet TAKE 1 TABLET BY MOUTH EVERY DAY   Blood Glucose Monitoring Suppl (ONE TOUCH ULTRA 2) w/Device KIT OneTouch Ultra2 Meter kit   Cinnamon Bark POWD Take 1 Scoop by mouth daily with breakfast.    glucose blood (CONTOUR NEXT TEST) test strip Use as instructed   glucose blood (ONETOUCH VERIO) test strip Use as instructed   losartan (COZAAR) 100 MG tablet TAKE 1/2 TABLET BY MOUTH DAILY   naproxen sodium (ALEVE) 220 MG tablet Take 220 mg by mouth 2 (two) times daily as needed.   ONETOUCH VERIO test strip TEST FASTING BLOOD SUGAR ONCE DAILY   No facility-administered medications prior to visit.    Review of Systems  Constitutional: Negative for appetite change, chills and fever.  Respiratory: Negative for chest tightness, shortness of breath and wheezing.   Cardiovascular: Negative for chest pain and palpitations.  Gastrointestinal: Negative for abdominal pain, nausea and vomiting.    Objective    BP (!) 147/81 (BP Location: Right Arm, Patient Position: Sitting, Cuff Size: Large)   Pulse 81   Temp (!) 97.5 F (36.4 C) (Other (Comment))   Resp 18   Ht 5' 8"  (1.727 m)   Wt 169 lb (76.7 kg)   SpO2 96%   BMI 25.70 kg/m  BP Readings from Last 3 Encounters:  05/25/20 (!) 147/81  01/05/20 122/82  10/24/19 (!) 176/95   Wt Readings from Last 3 Encounters:  05/25/20 169 lb (76.7 kg)  01/05/20 175 lb (79.4 kg)  10/24/19 164 lb (74.4 kg)   Physical Exam Constitutional:      General: He is not in acute distress.    Appearance: He is well-developed.  HENT:     Head: Normocephalic and atraumatic.     Right Ear: Hearing normal.     Left Ear: Hearing normal.     Nose: Nose normal.     Mouth/Throat:     Mouth: Mucous membranes are moist.     Pharynx: Oropharynx is clear.  Eyes:     General: Lids are normal. No scleral icterus.       Right eye: No discharge.        Left eye:  No discharge.     Conjunctiva/sclera: Conjunctivae normal.  Cardiovascular:     Rate and Rhythm: Normal rate and regular rhythm.     Heart sounds: Normal heart sounds.  Pulmonary:     Effort: Pulmonary effort is normal. No respiratory distress.  Abdominal:     General: Bowel sounds are normal.  Palpations: Abdomen is soft.  Musculoskeletal:        General: Normal range of motion.     Cervical back: Neck supple.  Skin:    Findings: No lesion or rash.  Neurological:     Mental Status: He is alert and oriented to person, place, and time.     Sensory: No sensory deficit.     Gait: Gait abnormal.  Psychiatric:        Speech: Speech normal.        Behavior: Behavior normal.        Thought Content: Thought content normal.     No results found for any visits on 05/25/20.  Assessment & Plan     1. 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 Last Hgb A1C on 01-06-20 was 4.8%. Continue present regimen and encouraged to get eye exam annually. Recheck labs. - CBC with Differential/Platelet - Basic Metabolic Panel (BMET)  2. Essential (primary) hypertension Good control of BP on the Losartan 50 mg qd and Norvasc 10 mg qd. No swelling or cough. Denies chest pains or palpitations. Recheck labs. - CBC with Differential/Platelet - Basic Metabolic Panel (BMET)  3. Low HDL (under 40) LDL 38 on 01-26-20. Trying to follow low fat diet. Declines additional medications now. Recheck labs. - Basic Metabolic Panel (BMET) - Lipid panel   No follow-ups on file.      I, Analisa Sledd, PA-C, have reviewed all documentation for this visit. The documentation on 06/05/21 for the exam, diagnosis, procedures, and orders are all accurate and complete.    [name], [place] [phone] (phone) [phone] (fax)  [place]

## 2020-05-28 DIAGNOSIS — E786 Lipoprotein deficiency: Secondary | ICD-10-CM | POA: Diagnosis not present

## 2020-05-28 DIAGNOSIS — I10 Essential (primary) hypertension: Secondary | ICD-10-CM | POA: Diagnosis not present

## 2020-05-28 DIAGNOSIS — E1100 Type 2 diabetes mellitus with hyperosmolarity without nonketotic hyperglycemic-hyperosmolar coma (NKHHC): Secondary | ICD-10-CM | POA: Diagnosis not present

## 2020-05-29 LAB — BASIC METABOLIC PANEL
BUN/Creatinine Ratio: 31 — ABNORMAL HIGH (ref 10–24)
BUN: 21 mg/dL (ref 8–27)
CO2: 23 mmol/L (ref 20–29)
Calcium: 9 mg/dL (ref 8.6–10.2)
Chloride: 102 mmol/L (ref 96–106)
Creatinine, Ser: 0.67 mg/dL — ABNORMAL LOW (ref 0.76–1.27)
GFR calc Af Amer: 105 mL/min/{1.73_m2} (ref >59)
GFR calc non Af Amer: 91 mL/min/{1.73_m2} (ref >59)
Glucose: 105 mg/dL — ABNORMAL HIGH (ref 65–99)
Potassium: 4 mmol/L (ref 3.5–5.2)
Sodium: 139 mmol/L (ref 134–144)

## 2020-05-29 LAB — CBC WITH DIFFERENTIAL/PLATELET
Basophils Absolute: 0.1 10*3/uL (ref 0.0–0.2)
Basos: 1 %
EOS (ABSOLUTE): 0.1 10*3/uL (ref 0.0–0.4)
Eos: 2 %
Hematocrit: 43.1 % (ref 37.5–51.0)
Hemoglobin: 14.4 g/dL (ref 13.0–17.7)
Immature Grans (Abs): 0 10*3/uL (ref 0.0–0.1)
Immature Granulocytes: 1 %
Lymphocytes Absolute: 1.7 10*3/uL (ref 0.7–3.1)
Lymphs: 29 %
MCH: 30.3 pg (ref 26.6–33.0)
MCHC: 33.4 g/dL (ref 31.5–35.7)
MCV: 91 fL (ref 79–97)
Monocytes Absolute: 0.4 10*3/uL (ref 0.1–0.9)
Monocytes: 7 %
Neutrophils Absolute: 3.6 10*3/uL (ref 1.4–7.0)
Neutrophils: 60 %
Platelets: 176 10*3/uL (ref 150–450)
RBC: 4.75 x10E6/uL (ref 4.14–5.80)
RDW: 12.3 % (ref 11.6–15.4)
WBC: 5.9 10*3/uL (ref 3.4–10.8)

## 2020-05-29 LAB — LIPID PANEL
Chol/HDL Ratio: 3 ratio (ref 0.0–5.0)
Cholesterol, Total: 118 mg/dL (ref 100–199)
HDL: 40 mg/dL (ref >39)
LDL Chol Calc (NIH): 64 mg/dL (ref 0–99)
Triglycerides: 68 mg/dL (ref 0–149)
VLDL Cholesterol Cal: 14 mg/dL (ref 5–40)

## 2020-05-30 ENCOUNTER — Telehealth: Payer: Self-pay

## 2020-05-30 NOTE — Telephone Encounter (Signed)
-----   Message from Margo Common, [place] sent at 05/29/2020  3:06 PM EDT ----- All blood tests in good shape. Continue present medications and follow diabetic diet plan. Recheck in 4-6 months.

## 2020-05-30 NOTE — Telephone Encounter (Signed)
Pt advised.  Apt made for 10/16/2020.   Thanks,   -Mickel Baas

## 2020-06-15 ENCOUNTER — Telehealth: Payer: Self-pay | Admitting: Family Medicine

## 2020-06-15 NOTE — Chronic Care Management (AMB) (Signed)
  Chronic Care Management   Note  06/15/2020 Name: ASAPH SERENA MRN: [id number] DOB: Dec 10, 1940  Murlean Caller is a 79 y.o. year old male who is a primary care patient of Chrismon, Vickki Muff, [place]. I reached out to [employer] by phone today in response to a referral sent by Mr. Asiel Chrostowski Moustafa's health plan.     Mr. Rozario was given information about Chronic Care Management services today including:  1. CCM service includes personalized support from designated clinical staff supervised by his physician, including individualized plan of care and coordination with other care providers 2. 24/7 contact phone numbers for assistance for urgent and routine care needs. 3. Service will only be billed when office clinical staff spend 20 minutes or more in a month to coordinate care. 4. Only one practitioner may furnish and bill the service in a calendar month. 5. The patient may stop CCM services at any time (effective at the end of the month) by phone call to the office staff. 6. The patient will be responsible for cost sharing (co-pay) of up to 20% of the service fee (after annual deductible is met).  Patient agreed to services and verbal consent obtained.   Follow up plan: Telephone appointment with care management team member scheduled for:07/12/2020.  [place], [place] [address] Direct Dial: [phone] [email or web address]: [email or web address]

## 2020-07-12 ENCOUNTER — Ambulatory Visit: Payer: Medicare HMO

## 2020-07-13 ENCOUNTER — Other Ambulatory Visit: Payer: Self-pay | Admitting: Family Medicine

## 2020-07-13 DIAGNOSIS — I10 Essential (primary) hypertension: Secondary | ICD-10-CM

## 2020-07-16 NOTE — Chronic Care Management (AMB) (Signed)
  Chronic Care Management   Note   Name: KAIVEN VESTER MRN: [id number] DOB: 1941-03-06    Primary Care Provider: Margo Common, PA Reason for referral : Chronic Care Management   Mr. Rotten was unable to complete the telephonic assessment as scheduled today. Will plan to complete the initial assessment when he is available.  PLAN Will complete the initial assessment when Mr. Boddy is available.   [facility] Care Management [facility] [phone]

## 2020-08-14 ENCOUNTER — Telehealth: Payer: Self-pay

## 2020-08-14 NOTE — Telephone Encounter (Signed)
°  Chronic Care Management   Outreach Note  08/14/2020 Name: TERRAN KLINKE MRN: [id number] DOB: [birth year]  Primary Care Provider: Margo Common, PA Reason for referral : Chronic Care Management   An unsuccessful telephone outreach was attempted today. Mr. Wahba was referred to the case management team for assistance with care management and care coordination.     PLAN A member care management team will reach out to Mr. Matsushita again within the next two weeks.    [facility] Care Management [facility] [phone]

## 2020-09-12 ENCOUNTER — Ambulatory Visit: Payer: Self-pay

## 2020-09-12 NOTE — Chronic Care Management (AMB) (Signed)
  Chronic Care Management   Outreach Note  09/12/2020 Name: [name] MRN: [id number] DOB: 1941/07/28  Primary Care Provider: Margo Common, PA Reason for referral : Chronic Care Management   Mr. Glaab was referred to the case management team for assistance with care management and care coordination.  His primary care provider will be notified of our unsuccessful attempts to establish contact. The care management team will gladly outreach at any time in the future if he is interested in receiving assistance.    PLAN:  The care management team will gladly follow up with Mr. Loughry after the primary care provider has a conversation with him regarding recommendation for care management engagement and subsequent re-referral for care management services.    [facility] Care Management [facility] [phone]

## 2020-10-13 ENCOUNTER — Other Ambulatory Visit: Payer: Self-pay | Admitting: Family Medicine

## 2020-10-13 DIAGNOSIS — I10 Essential (primary) hypertension: Secondary | ICD-10-CM

## 2020-10-13 NOTE — Telephone Encounter (Signed)
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

## 2020-10-15 NOTE — Progress Notes (Signed)
Established patient visit   Patient: [name]   DOB: Mar 21, 1941   79 y.o. Male  MRN: [id number] Visit Date: 10/16/2020  Today's healthcare provider: [name], PA   No chief complaint on file.  Subjective    HPI   Diabetes Mellitus Type II, Follow-up  Lab Results  Component Value Date   HGBA1C 4.8 01/06/2020   HGBA1C 5.0 12/06/2018   HGBA1C 5.0 12/06/2018   HGBA1C 5.0 (A) 12/06/2018   HGBA1C 5.0 12/06/2018   Wt Readings from Last 3 Encounters:  10/16/20 170 lb (77.1 kg)  05/25/20 169 lb (76.7 kg)  01/05/20 175 lb (79.4 kg)   Last seen for diabetes 6 months ago.  Management since then includes none. He reports good compliance with treatment. He is not having side effects.   Symptoms: No fatigue No foot ulcerations  No appetite changes No nausea  Yes paresthesia of the feet  No polydipsia  No polyuria No visual disturbances   No vomiting     Home blood sugar records: fasting range: 80-96  Episodes of hypoglycemia? No    Most Recent Eye Exam: January 2021   Pertinent Labs: Lab Results  Component Value Date   CHOL 118 05/28/2020   HDL 40 05/28/2020   LDLCALC 64 05/28/2020   TRIG 68 05/28/2020   CHOLHDL 3.0 05/28/2020   Lab Results  Component Value Date   NA 139 05/28/2020   K 4.0 05/28/2020   CREATININE 0.67 (L) 05/28/2020   GFRNONAA 91 05/28/2020   GFRAA 105 05/28/2020   GLUCOSE 105 (H) 05/28/2020      Hypertension, follow-up  BP Readings from Last 3 Encounters:  10/16/20 (!) 141/77  05/25/20 (!) 147/81  01/05/20 122/82   Wt Readings from Last 3 Encounters:  10/16/20 170 lb (77.1 kg)  05/25/20 169 lb (76.7 kg)  01/05/20 175 lb (79.4 kg)     He was last seen for hypertension 6 months ago.  BP at that visit was 147/81. Management since that visit includes none.  He reports good compliance with treatment. He is not having side effects.   Use of agents associated with hypertension: none.   Outside blood pressures are   . Symptoms: No chest pain No chest pressure  No palpitations No syncope  No dyspnea No orthopnea  No paroxysmal nocturnal dyspnea No lower extremity edema   Pertinent labs: Lab Results  Component Value Date   CHOL 118 05/28/2020   HDL 40 05/28/2020   LDLCALC 64 05/28/2020   TRIG 68 05/28/2020   CHOLHDL 3.0 05/28/2020   Lab Results  Component Value Date   NA 139 05/28/2020   K 4.0 05/28/2020   CREATININE 0.67 (L) 05/28/2020   GFRNONAA 91 05/28/2020   GFRAA 105 05/28/2020   GLUCOSE 105 (H) 05/28/2020     The ASCVD Risk score (Goff DC Jr., et al., 2013) failed to calculate for the following reasons:   The 2013 ASCVD risk score is only valid for ages 41 to 37   Past Medical History:  Diagnosis Date   Diabetes mellitus without complication ([place])    type 2. has not taken meds in two years    Heart murmur    has had for years   Hypertension    Stone, bladder    Past Surgical History:  Procedure Laterality Date   CATARACT EXTRACTION, BILATERAL     CHOLECYSTECTOMY  04/1995   CYSTOSCOPY WITH LITHOLAPAXY  02/11/2019   Procedure: CYSTOSCOPY WITH LITHOLAPAXY;  Surgeon: Billey Co, MD;  Location: [facility] ORS;  Service: Urology;;   EYE SURGERY     HOLEP-LASER ENUCLEATION OF THE PROSTATE WITH MORCELLATION N/A 02/11/2019   Procedure: HOLEP-LASER ENUCLEATION OF THE PROSTATE WITH MORCELLATION;  Surgeon: Billey Co, MD;  Location: [facility] ORS;  Service: Urology;  Laterality: N/A;   LIPOMA EXCISION  01/28/2011   Family History  Problem Relation Age of Onset   Heart attack Brother    Alzheimer's disease Sister    Lung cancer Son    Social History   Tobacco Use   Smoking status: Former Smoker    Packs/day: 1.00    Types: Cigarettes    Quit date: 1956    Years since quitting: 65.9   Smokeless tobacco: Former Systems developer    Types: Chew    Quit date: 2005   Tobacco comment: 79 years old for 1 year  Vaping Use   Vaping Use: Never used  Substance Use Topics   Alcohol use: No     Alcohol/week: 0.0 standard drinks   Drug use: No   Allergies  Allergen Reactions   Lisinopril Cough    Medications: Outpatient Medications Prior to Visit  Medication Sig   amLODipine (NORVASC) 10 MG tablet TAKE 1 TABLET BY MOUTH EVERY DAY   Blood Glucose Monitoring Suppl (ONE TOUCH ULTRA 2) w/Device KIT OneTouch Ultra2 Meter kit   Cinnamon Bark POWD Take 1 Scoop by mouth daily with breakfast.    glucose blood (CONTOUR NEXT TEST) test strip Use as instructed   glucose blood (ONETOUCH VERIO) test strip Use as instructed   losartan (COZAAR) 100 MG tablet TAKE 1/2 TABLET BY MOUTH EVERY DAY   naproxen sodium (ALEVE) 220 MG tablet Take 220 mg by mouth 2 (two) times daily as needed.   ONETOUCH VERIO test strip TEST FASTING BLOOD SUGAR ONCE DAILY   No facility-administered medications prior to visit.    Review of Systems  Constitutional: Negative.   HENT: Negative.   Respiratory: Negative.   Cardiovascular: Negative.   Gastrointestinal: Negative.   Genitourinary: Negative.   Musculoskeletal: Positive for back pain.  Neurological: Negative.        Objective    BP (!) 141/77 (BP Location: Right Arm, Patient Position: Sitting, Cuff Size: Normal)   Pulse 77   Temp 98.5 F (36.9 C) (Oral)   Wt 170 lb (77.1 kg)   SpO2 95%   BMI 25.85 kg/m     Physical Exam Constitutional:      General: He is not in acute distress.    Appearance: He is well-developed.  HENT:     Head: Normocephalic and atraumatic.     Right Ear: Hearing and tympanic membrane normal.     Left Ear: Hearing and tympanic membrane normal.     Nose: Nose normal.  Eyes:     General: Lids are normal. No scleral icterus.       Right eye: No discharge.        Left eye: No discharge.     Conjunctiva/sclera: Conjunctivae normal.  Cardiovascular:     Rate and Rhythm: Normal rate and regular rhythm.     Pulses: Normal pulses.     Heart sounds: Normal heart sounds.  Pulmonary:     Effort: Pulmonary effort is  normal. No respiratory distress.     Breath sounds: Normal breath sounds.  Abdominal:     General: Bowel sounds are normal.     Palpations: Abdomen is soft.  Musculoskeletal:  General: Normal range of motion.     Cervical back: Normal range of motion and neck supple.  Skin:    Findings: No lesion or rash.  Neurological:     Mental Status: He is alert and oriented to person, place, and time.  Psychiatric:        Speech: Speech normal.        Behavior: Behavior normal.        Thought Content: Thought content normal.       No results found for any visits on 10/16/20.  Assessment & Plan     1. Essential (primary) hypertension Tolerating Losartan and Amlodipine without side effects. Refill medications and recheck labs. - Comprehensive metabolic panel - losartan (COZAAR) 100 MG tablet; Take 0.5 tablets (50 mg total) by mouth daily.  Dispense: 45 tablet; Refill: 3 - amLODipine (NORVASC) 10 MG tablet; Take 1 tablet (10 mg total) by mouth daily.  Dispense: 90 tablet; Refill: 0  2. 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  - Comprehensive metabolic panel - Hemoglobin A1c  3. Need for influenza vaccination - Flu Vaccine QUAD High Dose(Fluad)   No follow-ups on file.      Andres Shad, PA, have reviewed all documentation for this visit. The documentation on 10/16/20 for the exam, diagnosis, procedures, and orders are all accurate and complete.    [name], [place] [phone] (phone) [phone] (fax)  [place]

## 2020-10-16 ENCOUNTER — Other Ambulatory Visit: Payer: Self-pay

## 2020-10-16 ENCOUNTER — Other Ambulatory Visit: Payer: Self-pay | Admitting: Family Medicine

## 2020-10-16 ENCOUNTER — Ambulatory Visit (INDEPENDENT_AMBULATORY_CARE_PROVIDER_SITE_OTHER): Payer: Medicare HMO | Admitting: Family Medicine

## 2020-10-16 VITALS — BP 141/77 | HR 77 | Temp 98.5°F | Wt 170.0 lb

## 2020-10-16 DIAGNOSIS — I10 Essential (primary) hypertension: Secondary | ICD-10-CM

## 2020-10-16 DIAGNOSIS — E1100 Type 2 diabetes mellitus with hyperosmolarity without nonketotic hyperglycemic-hyperosmolar coma (NKHHC): Secondary | ICD-10-CM

## 2020-10-16 DIAGNOSIS — Z23 Encounter for immunization: Secondary | ICD-10-CM

## 2020-10-16 MED ORDER — LOSARTAN POTASSIUM 100 MG PO TABS: 50.0000 mg | ORAL_TABLET | Freq: Every day | ORAL | 3 refills | Status: DC

## 2020-10-16 MED ORDER — AMLODIPINE BESYLATE 10 MG PO TABS: 10.0000 mg | ORAL_TABLET | Freq: Every day | ORAL | 0 refills | Status: DC

## 2020-10-16 NOTE — Telephone Encounter (Signed)
Requested medications are due for refill today yes  Requested medications are on the active medication list yes  Last refill 8/14  Future visit scheduled TODAY  Notes to clinic pt has appt today, please assess after appt.

## 2020-10-17 DIAGNOSIS — I10 Essential (primary) hypertension: Secondary | ICD-10-CM | POA: Diagnosis not present

## 2020-10-17 DIAGNOSIS — E1100 Type 2 diabetes mellitus with hyperosmolarity without nonketotic hyperglycemic-hyperosmolar coma (NKHHC): Secondary | ICD-10-CM | POA: Diagnosis not present

## 2020-10-18 LAB — COMPREHENSIVE METABOLIC PANEL
ALT: 15 IU/L (ref 0–44)
AST: 16 IU/L (ref 0–40)
Albumin/Globulin Ratio: 1.7 (ref 1.2–2.2)
Albumin: 4 g/dL (ref 3.6–4.6)
Alkaline Phosphatase: 81 IU/L (ref 44–121)
BUN/Creatinine Ratio: 33 — ABNORMAL HIGH (ref 10–24)
BUN: 20 mg/dL (ref 8–27)
Bilirubin Total: 0.6 mg/dL (ref 0.0–1.2)
CO2: 24 mmol/L (ref 20–29)
Calcium: 9.2 mg/dL (ref 8.6–10.2)
Chloride: 101 mmol/L (ref 96–106)
Creatinine, Ser: 0.6 mg/dL — ABNORMAL LOW (ref 0.76–1.27)
GFR calc Af Amer: 109 mL/min/{1.73_m2} (ref >59)
GFR calc non Af Amer: 94 mL/min/{1.73_m2} (ref >59)
Globulin, Total: 2.3 g/dL (ref 1.5–4.5)
Glucose: 90 mg/dL (ref 65–99)
Potassium: 4.3 mmol/L (ref 3.5–5.2)
Sodium: 139 mmol/L (ref 134–144)
Total Protein: 6.3 g/dL (ref 6.0–8.5)

## 2020-10-18 LAB — HEMOGLOBIN A1C
Est. average glucose Bld gHb Est-mCnc: 94 mg/dL
Hgb A1c MFr Bld: 4.9 % (ref 4.8–5.6)

## 2020-11-29 DIAGNOSIS — H524 Presbyopia: Secondary | ICD-10-CM | POA: Diagnosis not present

## 2020-11-29 DIAGNOSIS — E119 Type 2 diabetes mellitus without complications: Secondary | ICD-10-CM | POA: Diagnosis not present

## 2020-12-04 ENCOUNTER — Ambulatory Visit (INDEPENDENT_AMBULATORY_CARE_PROVIDER_SITE_OTHER): Payer: Medicare HMO

## 2020-12-04 ENCOUNTER — Other Ambulatory Visit: Payer: Self-pay

## 2020-12-04 DIAGNOSIS — Z0000 Encounter for general adult medical examination without abnormal findings: Secondary | ICD-10-CM

## 2020-12-04 NOTE — Progress Notes (Addendum)
Subjective:   [name] is a 80 y.o. male who presents for Medicare Annual/Subsequent preventive examination.  I connected with [name] today by telephone and verified that I am speaking with the correct person using two identifiers. Location patient: home Location provider: work Persons participating in the virtual visit: patient, provider.   I discussed the limitations, risks, security and privacy concerns of performing an evaluation and management service by telephone and the availability of in person appointments. I also discussed with the patient that there may be a patient responsible charge related to this service. The patient expressed understanding and verbally consented to this telephonic visit.    Interactive audio and video telecommunications were attempted between this provider and patient, however failed, due to patient having technical difficulties OR patient did not have access to video capability.  We continued and completed visit with audio only.   Review of Systems    N/A  Cardiac Risk Factors include: advanced age (>15mn, >>90women);male gender;hypertension;diabetes mellitus     Objective:    There were no vitals filed for this visit. There is no height or weight on file to calculate BMI.  Advanced Directives 12/04/2020 11/29/2019 02/07/2019 01/18/2019 01/18/2019 11/26/2018 11/18/2017  Does Patient Have a Medical Advance Directive? No No No No No Yes Yes  Type of Advance Directive - - - - - HPress photographerLiving will Living will  Copy of [place] Chart? - - - - - No - copy requested -  Would patient like information on creating a medical advance directive? No - Patient declined No - Patient declined No - Patient declined No - Patient declined No - Patient declined - -    Current Medications (verified) Outpatient Encounter Medications as of 12/04/2020  Medication Sig   amLODipine (NORVASC) 10 MG tablet Take 1 tablet  (10 mg total) by mouth daily.   Blood Glucose Monitoring Suppl (ONE TOUCH ULTRA 2) w/Device KIT OneTouch Ultra2 Meter kit   Cinnamon Bark POWD Take 1 Scoop by mouth daily with breakfast.    glucose blood (ONETOUCH VERIO) test strip Use as instructed   losartan (COZAAR) 100 MG tablet Take 0.5 tablets (50 mg total) by mouth daily.   naproxen sodium (ALEVE) 220 MG tablet Take 220 mg by mouth 2 (two) times daily as needed.   glucose blood (CONTOUR NEXT TEST) test strip Use as instructed (Patient not taking: Reported on 12/04/2020)   ONETOUCH VERIO test strip TEST FASTING BLOOD SUGAR ONCE DAILY (Patient not taking: Reported on 12/04/2020)   No facility-administered encounter medications on file as of 12/04/2020.    Allergies (verified) Lisinopril   History: Past Medical History:  Diagnosis Date   Diabetes mellitus without complication ([place]    type 2. has not taken meds in two years    Heart murmur    has had for years   Hypertension    Stone, bladder    Past Surgical History:  Procedure Laterality Date   CATARACT EXTRACTION, BILATERAL     CHOLECYSTECTOMY  04/1995   CYSTOSCOPY WITH LITHOLAPAXY  02/11/2019   Procedure: CYSTOSCOPY WITH LITHOLAPAXY;  Surgeon: SBilley Co MD;  Location: [facility] ORS;  Service: Urology;;   EYE SURGERY     HOLEP-LASER ENUCLEATION OF THE PROSTATE WITH MORCELLATION N/A 02/11/2019   Procedure: HOLEP-LASER ENUCLEATION OF THE PROSTATE WITH MORCELLATION;  Surgeon: SBilley Co MD;  Location: [facility] ORS;  Service: Urology;  Laterality: N/A;   LIPOMA EXCISION  01/28/2011  Family History  Problem Relation Age of Onset   Heart attack Brother    Alzheimer's disease Sister    Lung cancer Son    Social History   Socioeconomic History   Marital status: Widowed    Spouse name: [name]   Number of children: 5   Years of education: Not on file   Highest education level: 10th grade  Occupational History   Occupation: worked at [employer] for 35 yrs    Comment:  retired  Tobacco Use   Smoking status: Former Smoker    Packs/day: 1.00    Types: Cigarettes    Quit date: 1956    Years since quitting: 66.0   Smokeless tobacco: Former Systems developer    Types: Chew    Quit date: 2005   Tobacco comment: 80 years old for 1 year  Vaping Use   Vaping Use: Never used  Substance and Sexual Activity   Alcohol use: No    Alcohol/week: 0.0 standard drinks   Drug use: No   Sexual activity: Not on file  Other Topics Concern   Not on file  Social History Narrative   Not on file   Social Determinants of Health   Financial Resource Strain: Low Risk    Difficulty of Paying Living Expenses: Not hard at all  Food Insecurity: No Food Insecurity   Worried About Charity fundraiser in the Last Year: Never true   [place] in the Last Year: Never true  Transportation Needs: No Transportation Needs   Lack of Transportation (Medical): No   Lack of Transportation (Non-Medical): No  Physical Activity: Inactive   Days of Exercise per Week: 0 days   Minutes of Exercise per Session: 0 min  Stress: No Stress Concern Present   Feeling of Stress : Not at all  Social Connections: Moderately Integrated   Frequency of Communication with Friends and Family: More than three times a week   Frequency of Social Gatherings with Friends and Family: More than three times a week   Attends Religious Services: More than 4 times per year   Active Member of [employer] or Organizations: No   Attends Music therapist: Never   Marital Status: Married    Tobacco Counseling Counseling given: Not Answered Comment: 80 years old for 1 year   Clinical Intake:  Pre-visit preparation completed: Yes  Pain : No/denies pain     Nutritional Risks: None Diabetes: No  How often do you need to have someone help you when you read instructions, pamphlets, or other written materials from your doctor or pharmacy?: 1 - Never  Diabetic? Yes  Nutrition Risk Assessment:  Has the  patient had any N/V/D within the last 2 months?  No  Does the patient have any non-healing wounds?  No  Has the patient had any unintentional weight loss or weight gain?  No   Diabetes:  Is the patient diabetic?  Yes  If diabetic, was a CBG obtained today?  No  Did the patient bring in their glucometer from home?  No  How often do you monitor your CBG's? Once a day.   Financial Strains and Diabetes Management:  Are you having any financial strains with the device, your supplies or your medication? No .  Does the patient want to be seen by Chronic Care Management for management of their diabetes?  No  Would the patient like to be referred to a Nutritionist or for Diabetic Management?  No  Diabetic Exams:  Diabetic Eye Exam: Completed 12/12/19 Diabetic Foot Exam: Completed 01/05/20   Interpreter Needed?: No  Information entered by :: [facility], LPN   Activities of Daily Living In your present state of health, do you have any difficulty performing the following activities: 12/04/2020 10/16/2020  Hearing? N N  Vision? N N  Difficulty concentrating or making decisions? N N  Walking or climbing stairs? N N  Dressing or bathing? N N  Doing errands, shopping? N N  Preparing Food and eating ? N -  Using the Toilet? N -  In the past six months, have you accidently leaked urine? N -  Do you have problems with loss of bowel control? N -  Managing your Medications? N -  Managing your Finances? N -  Housekeeping or managing your Housekeeping? N -  Some recent data might be hidden    Patient Care Team: Chrismon, Vickki Muff, PA-C as PCP - General (Physician Assistant) Odette Fraction as Consulting Physician (Optometry)  Indicate any recent Medical Services you may have received from other than [facility] providers in the past year (date may be approximate).     Assessment:   This is a routine wellness examination for [name].  Hearing/Vision screen No exam data present  Dietary  issues and exercise activities discussed: Current Exercise Habits: The patient does not participate in regular exercise at present, Exercise limited by: orthopedic condition(s)  Goals      Exercise 3x per week (30 min per time)     Recommend to start exercising 3 days a week for 30 minutes at a time.       Depression Screen PHQ 2/9 Scores 12/04/2020 10/16/2020 11/29/2019 11/29/2019 11/26/2018 11/26/2018 11/18/2017  PHQ - 2 Score 0 0 0 0 0 0 0  PHQ- 9 Score - - - - 0 - 0    Fall Risk Fall Risk  12/04/2020 10/16/2020 11/29/2019 11/26/2018 11/18/2017  Falls in the past year? 0 0 0 0 No  Number falls in past yr: 0 0 0 0 -  Injury with Fall? 0 0 0 0 -    FALL RISK PREVENTION PERTAINING TO THE HOME:  Any stairs in or around the home? Yes  If so, are there any without handrails? No  Home free of loose throw rugs in walkways, pet beds, electrical cords, etc? Yes  Adequate lighting in your home to reduce risk of falls? Yes   ASSISTIVE DEVICES UTILIZED TO PREVENT FALLS:  Life alert? No  Use of a cane, walker or w/c? Yes  Grab bars in the bathroom? Yes  Shower chair or bench in shower? Yes  Elevated toilet seat or a handicapped toilet? Yes    Cognitive Function: Normal cognitive status assessed by observation by this Nurse Health Advisor. No abnormalities found.       6CIT Screen 11/29/2019 11/14/2016  What Year? 0 points 0 points  What month? 0 points 0 points  What time? 0 points 0 points  Count back from 20 0 points 0 points  Months in reverse 0 points 0 points  Repeat phrase 0 points 4 points  Total Score 0 4    Immunizations Immunization History  Administered Date(s) Administered   Fluad Quad(high Dose 65+) 08/10/2019, 10/16/2020   Influenza, High Dose Seasonal PF 11/14/2016, 09/17/2018   Influenza, Seasonal, Injecte, Preservative Fre 09/20/2012   Influenza-Unspecified 09/17/2017   Pneumococcal Conjugate-13 11/27/2017   Tdap 06/13/2016    TDAP status: Up to  date  Flu Vaccine  status: Up to date  Pneumococcal vaccine status: Due, Education has been provided regarding the importance of this vaccine. Advised may receive this vaccine at local pharmacy or Health Dept. Aware to provide a copy of the vaccination record if obtained from local pharmacy or Health Dept. Verbalized acceptance and understanding.  Covid-19 vaccine status: Completed vaccines  Qualifies for Shingles Vaccine? Yes   Zostavax completed No   Shingrix Completed?: No.    Education has been provided regarding the importance of this vaccine. Patient has been advised to call insurance company to determine out of pocket expense if they have not yet received this vaccine. Advised may also receive vaccine at local pharmacy or Health Dept. Verbalized acceptance and understanding.  Screening Tests Health Maintenance  Topic Date Due   COVID-19 Vaccine (1) Never done   PNA vac Low Risk Adult (2 of 2 - PPSV23) 11/27/2018   OPHTHALMOLOGY EXAM  12/11/2020   FOOT EXAM  01/04/2021   HEMOGLOBIN A1C  04/16/2021   TETANUS/TDAP  06/13/2026   INFLUENZA VACCINE  Completed    Health Maintenance  Health Maintenance Due  Topic Date Due   COVID-19 Vaccine (1) Never done   PNA vac Low Risk Adult (2 of 2 - PPSV23) 11/27/2018    Colorectal cancer screening: No longer required.   Lung Cancer Screening: (Low Dose CT Chest recommended if Age 61-80 years, 30 pack-year currently smoking OR have quit w/in 15years.) does not qualify.   Additional Screening:  Vision Screening: Recommended annual ophthalmology exams for early detection of glaucoma and other disorders of the eye. Is the patient up to date with their annual eye exam?  Yes  Who is the provider or what is the name of the office in which the patient attends annual eye exams? Dr Gwynn Burly If pt is not established with a provider, would they like to be referred to a provider to establish care? No .   Dental Screening: Recommended annual dental  exams for proper oral hygiene  Community Resource Referral / Chronic Care Management: CRR required this visit?  No   CCM required this visit?  No      Plan:     I have personally reviewed and noted the following in the patient's chart:   Medical and social history Use of alcohol, tobacco or illicit drugs  Current medications and supplements Functional ability and status Nutritional status Physical activity Advanced directives List of other physicians Hospitalizations, surgeries, and ER visits in previous 12 months Vitals Screenings to include cognitive, depression, and falls Referrals and appointments  In addition, I have reviewed and discussed with patient certain preventive protocols, quality metrics, and best practice recommendations. A written personalized care plan for preventive services as well as general preventive health recommendations were provided to patient.     [name] [place], [place]   12/04/1028   Nurse Notes: Pt needs a Pneumovax 23 vaccine at next in office apt. Pt has received both Covid vaccines. Requested vaccine card to up date chart.   Reviewed note of Nurse Health Advisor's screening. Agree with documentation and plan.

## 2020-12-04 NOTE — Patient Instructions (Signed)
[name] , Thank you for taking time to come for your Medicare Wellness Visit. I appreciate your ongoing commitment to your health goals. Please review the following plan we discussed and let me know if I can assist you in the future.   Screening recommendations/referrals: Colonoscopy: No longer required.  Recommended yearly ophthalmology/optometry visit for glaucoma screening and checkup Recommended yearly dental visit for hygiene and checkup  Vaccinations: Influenza vaccine: Done 10/16/20 Pneumococcal vaccine: Pneumovax 23 due. Will receive at next in office apt. Tdap vaccine: Up to date, due 05/2026 Shingles vaccine: Shingrix discussed. Please contact your pharmacy for coverage information.     Advanced directives: Advance directive discussed with you today. Even though you declined this today please call our office should you change your mind and we can give you the proper paperwork for you to fill out.  Conditions/risks identified: Recommend to start exercising 3 days a week for 30 minutes at a time.  Next appointment: 01/08/21 @ 10:40 AM with Dortha Kern  Preventive Care 65 Years and Older, Male Preventive care refers to lifestyle choices and visits with your health care provider that can promote health and wellness. What does preventive care include?  A yearly physical exam. This is also called an annual well check.  Dental exams once or twice a year.  Routine eye exams. Ask your health care provider how often you should have your eyes checked.  Personal lifestyle choices, including:  Daily care of your teeth and gums.  Regular physical activity.  Eating a healthy diet.  Avoiding tobacco and drug use.  Limiting alcohol use.  Practicing safe sex.  Taking low doses of aspirin every day.  Taking vitamin and mineral supplements as recommended by your health care provider. What happens during an annual well check? The services and screenings done by your health  care provider during your annual well check will depend on your age, overall health, lifestyle risk factors, and family history of disease. Counseling  Your health care provider may ask you questions about your:  Alcohol use.  Tobacco use.  Drug use.  Emotional well-being.  Home and relationship well-being.  Sexual activity.  Eating habits.  History of falls.  Memory and ability to understand (cognition).  Work and work Astronomer. Screening  You may have the following tests or measurements:  Height, weight, and BMI.  Blood pressure.  Lipid and cholesterol levels. These may be checked every 5 years, or more frequently if you are over 47 years old.  Skin check.  Lung cancer screening. You may have this screening every year starting at age 26 if you have a 30-pack-year history of smoking and currently smoke or have quit within the past 15 years.  Fecal occult blood test (FOBT) of the stool. You may have this test every year starting at age 46.  Flexible sigmoidoscopy or colonoscopy. You may have a sigmoidoscopy every 5 years or a colonoscopy every 10 years starting at age 15.  Prostate cancer screening. Recommendations will vary depending on your family history and other risks.  Hepatitis C blood test.  Hepatitis B blood test.  Sexually transmitted disease (STD) testing.  Diabetes screening. This is done by checking your blood sugar (glucose) after you have not eaten for a while (fasting). You may have this done every 1-3 years.  Abdominal aortic aneurysm (AAA) screening. You may need this if you are a current or former smoker.  Osteoporosis. You may be screened starting at age 51 if you are at  high risk. Talk with your health care provider about your test results, treatment options, and if necessary, the need for more tests. Vaccines  Your health care provider may recommend certain vaccines, such as:  Influenza vaccine. This is recommended every  year.  Tetanus, diphtheria, and acellular pertussis (Tdap, Td) vaccine. You may need a Td booster every 10 years.  Zoster vaccine. You may need this after age 60.  Pneumococcal 13-valent conjugate (PCV13) vaccine. One dose is recommended after age 31.  Pneumococcal polysaccharide (PPSV23) vaccine. One dose is recommended after age 67. Talk to your health care provider about which screenings and vaccines you need and how often you need them. This information is not intended to replace advice given to you by your health care provider. Make sure you discuss any questions you have with your health care provider. Document Released: 12/14/2015 Document Revised: 08/06/2016 Document Reviewed: 09/18/2015 Elsevier Interactive Patient Education  2017 [place] Prevention in the Home Falls can cause injuries. They can happen to people of all ages. There are many things you can do to make your home safe and to help prevent falls. What can I do on the outside of my home?  Regularly fix the edges of walkways and driveways and fix any cracks.  Remove anything that might make you trip as you walk through a door, such as a raised step or threshold.  Trim any bushes or trees on the path to your home.  Use bright outdoor lighting.  Clear any walking paths of anything that might make someone trip, such as rocks or tools.  Regularly check to see if handrails are loose or broken. Make sure that both sides of any steps have handrails.  Any raised decks and porches should have guardrails on the edges.  Have any leaves, snow, or ice cleared regularly.  Use sand or salt on walking paths during winter.  Clean up any spills in your garage right away. This includes oil or grease spills. What can I do in the bathroom?  Use night lights.  Install grab bars by the toilet and in the tub and shower. Do not use towel bars as grab bars.  Use non-skid mats or decals in the tub or shower.  If you  need to sit down in the shower, use a plastic, non-slip stool.  Keep the floor dry. Clean up any water that spills on the floor as soon as it happens.  Remove soap buildup in the tub or shower regularly.  Attach bath mats securely with double-sided non-slip rug tape.  Do not have throw rugs and other things on the floor that can make you trip. What can I do in the bedroom?  Use night lights.  Make sure that you have a light by your bed that is easy to reach.  Do not use any sheets or blankets that are too big for your bed. They should not hang down onto the floor.  Have a firm chair that has side arms. You can use this for support while you get dressed.  Do not have throw rugs and other things on the floor that can make you trip. What can I do in the kitchen?  Clean up any spills right away.  Avoid walking on wet floors.  Keep items that you use a lot in easy-to-reach places.  If you need to reach something above you, use a strong step stool that has a grab bar.  Keep electrical cords out of the  way.  Do not use floor polish or wax that makes floors slippery. If you must use wax, use non-skid floor wax.  Do not have throw rugs and other things on the floor that can make you trip. What can I do with my stairs?  Do not leave any items on the stairs.  Make sure that there are handrails on both sides of the stairs and use them. Fix handrails that are broken or loose. Make sure that handrails are as long as the stairways.  Check any carpeting to make sure that it is firmly attached to the stairs. Fix any carpet that is loose or worn.  Avoid having throw rugs at the top or bottom of the stairs. If you do have throw rugs, attach them to the floor with carpet tape.  Make sure that you have a light switch at the top of the stairs and the bottom of the stairs. If you do not have them, ask someone to add them for you. What else can I do to help prevent falls?  Wear shoes  that:  Do not have high heels.  Have rubber bottoms.  Are comfortable and fit you well.  Are closed at the toe. Do not wear sandals.  If you use a stepladder:  Make sure that it is fully opened. Do not climb a closed stepladder.  Make sure that both sides of the stepladder are locked into place.  Ask someone to hold it for you, if possible.  Clearly mark and make sure that you can see:  Any grab bars or handrails.  First and last steps.  Where the edge of each step is.  Use tools that help you move around (mobility aids) if they are needed. These include:  Canes.  Walkers.  Scooters.  Crutches.  Turn on the lights when you go into a dark area. Replace any light bulbs as soon as they burn out.  Set up your furniture so you have a clear path. Avoid moving your furniture around.  If any of your floors are uneven, fix them.  If there are any pets around you, be aware of where they are.  Review your medicines with your doctor. Some medicines can make you feel dizzy. This can increase your chance of falling. Ask your doctor what other things that you can do to help prevent falls. This information is not intended to replace advice given to you by your health care provider. Make sure you discuss any questions you have with your health care provider. Document Released: 09/13/2009 Document Revised: 04/24/2016 Document Reviewed: 12/22/2014 Elsevier Interactive Patient Education  2017 [employer].

## 2021-01-08 ENCOUNTER — Encounter: Payer: Medicare HMO | Admitting: Family Medicine

## 2021-01-14 ENCOUNTER — Emergency Department
Admission: EM | Admit: 2021-01-14 | Discharge: 2021-01-14 | Disposition: A | Discharge status: Home / Self Care | Payer: Medicare HMO | Attending: Emergency Medicine | Admitting: Emergency Medicine

## 2021-01-14 ENCOUNTER — Other Ambulatory Visit: Payer: Self-pay

## 2021-01-14 DIAGNOSIS — J45909 Unspecified asthma, uncomplicated: Secondary | ICD-10-CM | POA: Insufficient documentation

## 2021-01-14 DIAGNOSIS — I10 Essential (primary) hypertension: Secondary | ICD-10-CM | POA: Insufficient documentation

## 2021-01-14 DIAGNOSIS — E119 Type 2 diabetes mellitus without complications: Secondary | ICD-10-CM | POA: Insufficient documentation

## 2021-01-14 DIAGNOSIS — Z20822 Contact with and (suspected) exposure to covid-19: Secondary | ICD-10-CM | POA: Diagnosis not present

## 2021-01-14 DIAGNOSIS — Z87891 Personal history of nicotine dependence: Secondary | ICD-10-CM | POA: Diagnosis not present

## 2021-01-14 DIAGNOSIS — Z79899 Other long term (current) drug therapy: Secondary | ICD-10-CM | POA: Insufficient documentation

## 2021-01-14 DIAGNOSIS — R69 Illness, unspecified: Secondary | ICD-10-CM | POA: Diagnosis not present

## 2021-01-14 DIAGNOSIS — F419 Anxiety disorder, unspecified: Secondary | ICD-10-CM | POA: Diagnosis not present

## 2021-01-14 LAB — RESP PANEL BY RT-PCR (FLU A&B, COVID) ARPGX2
Influenza A by PCR: NEGATIVE
Influenza B by PCR: NEGATIVE
SARS Coronavirus 2 by RT PCR: NEGATIVE

## 2021-01-14 NOTE — ED Notes (Signed)
Says took bp when he got up this am and it was elevated.  200 over 100.  Says he usually takes bp meds at 6am, but got up at 7 today.  He did take the meds already.  Denies any pain.

## 2021-01-14 NOTE — ED Triage Notes (Signed)
First Nurse Note:  Arrives with c/o htn this morning.  Denies other complaint.  AAOx3.  Skin warm and dry. NAD

## 2021-01-14 NOTE — Discharge Instructions (Addendum)
Your COVID-19 test was negative.  Your blood pressure is responding to your medications taken earlier today.  Follow-up with PCP.

## 2021-01-14 NOTE — ED Provider Notes (Signed)
[facility] Emergency Department Provider Note   ____________________________________________   Event Date/Time   First MD Initiated Contact with Patient 01/14/21 [phone]     (approximate)  I have reviewed the triage vital signs and the nursing notes.   HISTORY  Chief Complaint Hypertension    HPI [name] is a 80 y.o. male patient presents for elevated blood pressure.  Patient state home unit shows 204/105.  Patient took the pressure medication consists of lumbar pain and losartan around 7:30 this morning.  Patient denies headache, vision disturbance, weakness, or vertigo.  Patient was concerned because his sister had an elevated blood pressure and it found she had Covid.  Patient has completed COVID-19 vaccine with last doses being given in May 2021.  Patient has not taken booster.         Past Medical History:  Diagnosis Date  . Diabetes mellitus without complication ([place])    type 2. has not taken meds in two years   . Heart murmur    has had for years  . Hypertension   . Stone, bladder     Patient Active Problem List   Diagnosis Date Noted  . ARF (acute renal failure) ([place]) 01/18/2019  . Closed fracture of shaft of fibula 07/03/2016  . 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 LBP (low back pain) 08/20/2015  . Esophageal reflux 08/20/2015  . Cardiac murmur 08/20/2015  . 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 Personal history of methicillin resistant Staphylococcus aureus 08/20/2015  . Essential (primary) hypertension 08/20/2015  . Muscle ache 08/20/2015  . Edema, peripheral 08/20/2015  . Asthma 08/20/2015  . Allergic rhinitis, seasonal 08/20/2015  . 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 Diabetes mellitus, type 2 ([place]) 08/20/2015    Past Surgical History:  Procedure Laterality Date  . CATARACT EXTRACTION, BILATERAL    . CHOLECYSTECTOMY  04/1995  .  CYSTOSCOPY WITH LITHOLAPAXY  02/11/2019   Procedure: CYSTOSCOPY WITH LITHOLAPAXY;  Surgeon: Billey Co, MD;  Location: [facility] ORS;  Service: Urology;;  . EYE SURGERY    . HOLEP-LASER ENUCLEATION OF THE PROSTATE WITH MORCELLATION N/A 02/11/2019   Procedure: HOLEP-LASER ENUCLEATION OF THE PROSTATE WITH MORCELLATION;  Surgeon: Billey Co, MD;  Location: [facility] ORS;  Service: Urology;  Laterality: N/A;  . LIPOMA EXCISION  01/28/2011    Prior to Admission medications   Medication Sig Start Date End Date Taking? Authorizing Provider  amLODipine (NORVASC) 10 MG tablet Take 1 tablet (10 mg total) by mouth daily. 10/16/20   Chrismon, Vickki Muff, PA-C  Blood Glucose Monitoring Suppl (ONE TOUCH ULTRA 2) w/Device KIT OneTouch Ultra2 Meter kit    [provider]  Cinnamon Bark POWD Take 1 Scoop by mouth daily with breakfast.     [provider]  glucose blood (CONTOUR NEXT TEST) test strip Use as instructed Patient not taking: Reported on 12/04/2020 01/09/20   Chrismon, Vickki Muff, PA-C  glucose blood (ONETOUCH VERIO) test strip Use as instructed 01/09/20   Chrismon, Vickki Muff, PA-C  losartan (COZAAR) 100 MG tablet Take 0.5 tablets (50 mg total) by mouth daily. 10/16/20   Chrismon, Vickki Muff, PA-C  naproxen sodium (ALEVE) 220 MG tablet Take 220 mg by mouth 2 (two) times daily as needed.    [provider]  [facility] VERIO test strip TEST FASTING BLOOD SUGAR ONCE DAILY Patient not taking: Reported on 12/04/2020 06/02/19   Chrismon, Vickki Muff,  PA-C    Allergies Lisinopril  Family History  Problem Relation Age of Onset  . Heart attack Brother   . Alzheimer's disease Sister   . Lung cancer Son     Social History Social History   Tobacco Use  . Smoking status: Former Smoker    Packs/day: 1.00    Types: Cigarettes    Quit date: 1956    Years since quitting: 66.1  . Smokeless tobacco: Former Systems developer    Types: Chew    Quit date: 2005  . Tobacco comment: 80 years old for 1 year   Vaping Use  . Vaping Use: Never used  Substance Use Topics  . Alcohol use: No    Alcohol/week: 0.0 standard drinks  . Drug use: No    Review of Systems Constitutional: No fever/chills Eyes: No visual changes. ENT: No sore throat. Cardiovascular: Denies chest pain. Respiratory: Denies shortness of breath. Gastrointestinal: No abdominal pain.  No nausea, no vomiting.  No diarrhea.  No constipation. Genitourinary: Negative for dysuria. Musculoskeletal: Negative for back pain. Skin: Negative for rash. Neurological: Negative for headaches, focal weakness or numbness. Endocrine:  Diabetes and hypertension. Allergic/Immunilogical: Lisinopril  ____________________________________________   PHYSICAL EXAM:  VITAL SIGNS: ED Triage Vitals  Enc Vitals Group     BP 01/14/21 0837 (!) 172/88     Pulse Rate 01/14/21 0837 77     Resp 01/14/21 0837 18     Temp 01/14/21 0837 98.3 F (36.8 C)     Temp Source 01/14/21 0837 Oral     SpO2 01/14/21 0837 97 %     Weight 01/14/21 0838 169 lb 15.6 oz (77.1 kg)     Height --      Head Circumference --      Peak Flow --      Pain Score 01/14/21 0900 0     Pain Loc --      Pain Edu? --      Excl. in [place]? --    Constitutional: Alert and oriented. Well appearing and in no acute distress. Eyes: Conjunctivae are normal. PERRL. EOMI. Head: Atraumatic. Nose: No congestion/rhinnorhea. Mouth/Throat: Mucous membranes are moist.  Oropharynx non-erythematous. Neck: No stridor.  Hematological/Lymphatic/Immunilogical: No cervical lymphadenopathy. Cardiovascular: Normal rate, regular rhythm. Grossly normal heart sounds.  Good peripheral circulation.  Elevated blood pressure.  Responding well to prescribed medication. Respiratory: Normal respiratory effort.  No retractions. Lungs CTAB. Gastrointestinal: Soft and nontender. No distention. No abdominal bruits. No CVA tenderness. Musculoskeletal: No lower extremity tenderness nor edema.  No joint  effusions. Neurologic:  Normal speech and language. No gross focal neurologic deficits are appreciated. No gait instability. Skin:  Skin is warm, dry and intact. No rash noted. Psychiatric: Mood and affect are normal. Speech and behavior are normal.  ____________________________________________   LABS (all labs ordered are listed, but only abnormal results are displayed)  Labs Reviewed  RESP PANEL BY RT-PCR (FLU A&B, COVID) [id number]   ____________________________________________  EKG   ____________________________________________  RADIOLOGY I, Sable Feil, personally viewed and evaluated these images (plain radiographs) as part of my medical decision making, as well as reviewing the written report by the radiologist.  ED MD interpretation:    Official radiology report(s): No results found.  ____________________________________________   PROCEDURES  Procedure(s) performed (including Critical Care):  Procedures   ____________________________________________   INITIAL IMPRESSION / ASSESSMENT AND PLAN / ED COURSE  As part of my medical decision making, I reviewed the following data within the [place]  Patient presents with concern of elevated blood pressure.  Patient blood pressure at home was 204/105.  Patient has taken medications prior to arrival.  Patient also was concerned secondary to COVID-19 exposure.  Patient COVID-19 test was negative.  Patient blood pressure has decreased to 158/79.  Patient given discharge care instructions and advised follow-up PCP.      ____________________________________________   FINAL CLINICAL IMPRESSION(S) / ED DIAGNOSES  Final diagnoses:  Primary hypertension  Anxiety     ED Discharge Orders    None      *Please note:  [name] was evaluated in Emergency Department on 01/14/2021 for the symptoms described in the history of present illness. He was evaluated in the context of the  global COVID-19 pandemic, which necessitated consideration that the patient might be at risk for infection with the SARS-CoV-2 virus that causes COVID-19. Institutional protocols and algorithms that pertain to the evaluation of patients at risk for COVID-19 are in a state of rapid change based on information released by regulatory bodies including the CDC and federal and state organizations. These policies and algorithms were followed during the patient's care in the ED.  Some ED evaluations and interventions may be delayed as a result of limited staffing during and the pandemic.*   Note:  This document was prepared using Dragon voice recognition software and may include unintentional dictation errors.    Sable Feil, PA-C 01/14/21 1115    Delman Kitten, MD 01/14/21 [phone]

## 2021-01-14 NOTE — ED Triage Notes (Signed)
Pt in w/HTN, states his bp was 204/105 this am. Has taken his bp meds since (Amlodipine, Losartan). bp 172/88 in triage. No other complaints, feels his normal self

## 2021-01-15 DIAGNOSIS — I10 Essential (primary) hypertension: Secondary | ICD-10-CM | POA: Diagnosis not present

## 2021-01-15 DIAGNOSIS — Z79899 Other long term (current) drug therapy: Secondary | ICD-10-CM | POA: Diagnosis not present

## 2021-01-15 DIAGNOSIS — Z20822 Contact with and (suspected) exposure to covid-19: Secondary | ICD-10-CM | POA: Diagnosis not present

## 2021-01-23 ENCOUNTER — Ambulatory Visit (INDEPENDENT_AMBULATORY_CARE_PROVIDER_SITE_OTHER): Payer: Medicare HMO | Admitting: Physician Assistant

## 2021-01-23 ENCOUNTER — Other Ambulatory Visit: Payer: Self-pay

## 2021-01-23 VITALS — BP 159/81 | HR 85 | Temp 98.3°F | Wt 175.6 lb

## 2021-01-23 DIAGNOSIS — I10 Essential (primary) hypertension: Secondary | ICD-10-CM | POA: Diagnosis not present

## 2021-01-23 MED ORDER — LOSARTAN POTASSIUM 100 MG PO TABS: 100.0000 mg | ORAL_TABLET | Freq: Every day | ORAL | 3 refills | Status: DC

## 2021-01-23 NOTE — Progress Notes (Signed)
Established patient visit   Patient: [name]   DOB: [birth year]   80 y.o. Male  MRN: [id number] Visit Date: 01/23/2021  Today's healthcare provider: Trinna Post, PA-C   Chief Complaint  Patient presents with  . Hypertension  I,Porsha C McClurkin,acting as a scribe for [employer], PA-C.,have documented all relevant documentation on the behalf of Trinna Post, PA-C,as directed by  Trinna Post, PA-C while in the presence of Trinna Post, PA-C.  Subjective    HPI  Hypertension, follow-up  BP Readings from Last 3 Encounters:  01/23/21 (!) 159/81  01/14/21 (!) 158/79  10/16/20 (!) 141/77   Wt Readings from Last 3 Encounters:  01/23/21 175 lb 9.6 oz (79.7 kg)  01/14/21 169 lb 15.6 oz (77.1 kg)  10/16/20 170 lb (77.1 kg)     He was last seen for hypertension 3 months ago.  BP at that visit was 141/77. Management since that visit includes seeing the ER twice. Doctor from ER changed losartan from 67m to 763mdaily in addition to amlodipine 10 mg daily. Patient was seen in the ER on 2/14/222 and 01/15/21 for elevated blood pressure readings. Denies chest pain, headaches, vision changes.   He reports good compliance with treatment. He is not having side effects.  He is following a Regular diet. He is not exercising. He does not smoke.  Use of agents associated with hypertension: none.   Outside blood pressures are arranging around 180's-150's/80's-70's . Symptoms: No chest pain No chest pressure  No palpitations No syncope  No dyspnea No orthopnea  No paroxysmal nocturnal dyspnea No lower extremity edema   Pertinent labs: Lab Results  Component Value Date   CHOL 118 05/28/2020   HDL 40 05/28/2020   LDLCALC 64 05/28/2020   TRIG 68 05/28/2020   CHOLHDL 3.0 05/28/2020   Lab Results  Component Value Date   NA 139 10/17/2020   K 4.3 10/17/2020   CREATININE 0.60 (L) 10/17/2020   GFRNONAA 94 10/17/2020   GFRAA 109 10/17/2020   GLUCOSE 90  10/17/2020     The ASCVD Risk score (Goff DC Jr., et al., 2013) failed to calculate for the following reasons:   The 2013 ASCVD risk score is only valid for ages 4068o 7955 ---------------------------------------------------------------------------------------------------      Medications: Outpatient Medications Prior to Visit  Medication Sig  . amLODipine (NORVASC) 10 MG tablet Take 1 tablet (10 mg total) by mouth daily.  . Blood Glucose Monitoring Suppl (ONE TOUCH ULTRA 2) w/Device KIT OneTouch Ultra2 Meter kit  . Cinnamon Bark POWD Take 1 Scoop by mouth daily with breakfast.   . glucose blood (CONTOUR NEXT TEST) test strip Use as instructed (Patient not taking: Reported on 12/04/2020)  . glucose blood (ONETOUCH VERIO) test strip Use as instructed  . losartan (COZAAR) 100 MG tablet Take 0.5 tablets (50 mg total) by mouth daily.  . naproxen sodium (ALEVE) 220 MG tablet Take 220 mg by mouth 2 (two) times daily as needed.  . Glory RosebushERIO test strip TEST FASTING BLOOD SUGAR ONCE DAILY (Patient not taking: Reported on 12/04/2020)   No facility-administered medications prior to visit.    Review of Systems  Constitutional: Negative.   Respiratory: Negative.   Cardiovascular: Negative.   Hematological: Negative.        Objective    BP (!) 159/81 (BP Location: Left Arm, Patient Position: Sitting, Cuff Size: Normal)   Pulse 85  Temp 98.3 F (36.8 C) (Oral)   Wt 175 lb 9.6 oz (79.7 kg)   SpO2 98%   BMI 26.70 kg/m     Physical Exam Constitutional:      Appearance: Normal appearance.  Cardiovascular:     Rate and Rhythm: Normal rate and regular rhythm.     Heart sounds: Normal heart sounds.  Pulmonary:     Effort: Pulmonary effort is normal.     Breath sounds: Normal breath sounds.  Skin:    General: Skin is warm and dry.  Neurological:     General: No focal deficit present.     Mental Status: He is alert and oriented to person, place, and time.  Psychiatric:         Mood and Affect: Mood normal.        Behavior: Behavior normal.       No results found for any visits on 01/23/21.  Assessment & Plan    1. Essential (primary) hypertension  Discussed increasing losartan vs adding HCTZ. Agreed to increase losartan to 100 mg daily and f/u 6 weeks. If BP not controlled, may consider adding HCTZ.   - losartan (COZAAR) 100 MG tablet; Take 1 tablet (100 mg total) by mouth daily.  Dispense: 90 tablet; Refill: 3   No follow-ups on file.      ITrinna Post, PA-C, have reviewed all documentation for this visit. The documentation on 01/24/21 for the exam, diagnosis, procedures, and orders are all accurate and complete.  The entirety of the information documented in the History of Present Illness, Review of Systems and Physical Exam were personally obtained by me. Portions of this information were initially documented by [facility] and reviewed by me for thoroughness and accuracy.     Paulene Floor  [facility] [phone] (phone) [phone] (fax)  [place]

## 2021-02-05 DIAGNOSIS — E663 Overweight: Secondary | ICD-10-CM | POA: Diagnosis not present

## 2021-02-05 DIAGNOSIS — Z6827 Body mass index (BMI) 27.0-27.9, adult: Secondary | ICD-10-CM | POA: Diagnosis not present

## 2021-02-05 DIAGNOSIS — Z791 Long term (current) use of non-steroidal anti-inflammatories (NSAID): Secondary | ICD-10-CM | POA: Diagnosis not present

## 2021-02-05 DIAGNOSIS — Z833 Family history of diabetes mellitus: Secondary | ICD-10-CM | POA: Diagnosis not present

## 2021-02-05 DIAGNOSIS — Z87891 Personal history of nicotine dependence: Secondary | ICD-10-CM | POA: Diagnosis not present

## 2021-02-05 DIAGNOSIS — Z7982 Long term (current) use of aspirin: Secondary | ICD-10-CM | POA: Diagnosis not present

## 2021-02-05 DIAGNOSIS — I10 Essential (primary) hypertension: Secondary | ICD-10-CM | POA: Diagnosis not present

## 2021-02-05 DIAGNOSIS — R269 Unspecified abnormalities of gait and mobility: Secondary | ICD-10-CM | POA: Diagnosis not present

## 2021-02-05 DIAGNOSIS — M1990 Unspecified osteoarthritis, unspecified site: Secondary | ICD-10-CM | POA: Diagnosis not present

## 2021-02-05 DIAGNOSIS — Z803 Family history of malignant neoplasm of breast: Secondary | ICD-10-CM | POA: Diagnosis not present

## 2021-02-06 ENCOUNTER — Other Ambulatory Visit: Payer: Self-pay | Admitting: Physician Assistant

## 2021-02-06 DIAGNOSIS — I10 Essential (primary) hypertension: Secondary | ICD-10-CM

## 2021-02-06 NOTE — Telephone Encounter (Signed)
Requested medication (s) are due for refill today: yes  Requested medication (s) are on the active medication list: yes  Last refill:   Future visit scheduled: yes  Notes to clinic:  Pharmacy comment: Alternative Requested:ON BACKORDER    Requested Prescriptions  Pending Prescriptions Disp Refills   telmisartan (MICARDIS) 80 MG tablet [Pharmacy Med Name: TELMISARTAN 80 MG TABLET]  0      Cardiovascular:  Angiotensin Receptor Blockers Failed - 02/06/2021 10:44 AM      Failed - Cr in normal range and within 180 days    Creatinine, Ser  Date Value Ref Range Status  10/17/2020 0.60 (L) 0.76 - 1.27 mg/dL Final   Creatinine, POC  Date Value Ref Range Status  11/27/2017 NA mg/dL Final          Failed - Last BP in normal range    BP Readings from Last 1 Encounters:  01/23/21 (!) 159/81          Passed - K in normal range and within 180 days    Potassium  Date Value Ref Range Status  10/17/2020 4.3 3.5 - 5.2 mmol/L Final          Passed - Patient is not pregnant      Passed - Valid encounter within last 6 months    Recent Outpatient Visits           2 weeks ago Essential (primary) hypertension   [employer], Adriana M, PA-C   3 months ago Type 2 diabetes mellitus with hyperosmolarity without coma, without long-term current use of insulin ([place])   [place], Vickki Muff, PA-C   8 months ago Type 2 diabetes mellitus with hyperosmolarity without coma, without long-term current use of insulin ([place])   [place], Vickki Muff, PA-C   1 year ago Type 2 diabetes mellitus with hyperosmolarity without coma, without long-term current use of insulin [facility])   [place], Vickki Muff, PA-C   1 year ago Pain of left upper extremity   [employer], Vickki Muff, PA-C       Future Appointments             In 5 days [place], Aaron Edelman, MD [facility]    In 1 month [place], Vickki Muff, PA-C [employer], [place]

## 2021-02-07 NOTE — Telephone Encounter (Signed)
Sent in telmisartan 80 mg daily because losartan is on backorder.

## 2021-02-07 NOTE — Telephone Encounter (Signed)
I called pt and pt verbalized understanding of information below.  

## 2021-02-11 ENCOUNTER — Other Ambulatory Visit: Payer: Self-pay

## 2021-02-11 ENCOUNTER — Encounter: Payer: Self-pay | Admitting: Cardiology

## 2021-02-11 ENCOUNTER — Ambulatory Visit (INDEPENDENT_AMBULATORY_CARE_PROVIDER_SITE_OTHER): Payer: Medicare HMO | Admitting: Cardiology

## 2021-02-11 VITALS — BP 150/60 | HR 84 | Ht 67.0 in | Wt 175.4 lb

## 2021-02-11 DIAGNOSIS — R011 Cardiac murmur, unspecified: Secondary | ICD-10-CM

## 2021-02-11 DIAGNOSIS — I10 Essential (primary) hypertension: Secondary | ICD-10-CM | POA: Diagnosis not present

## 2021-02-11 NOTE — Patient Instructions (Signed)
Medication Instructions:  Your physician recommends that you continue on your current medications as directed. Please refer to the Current Medication list given to you today.  *If you need a refill on your cardiac medications before your next appointment, please call your pharmacy*   Lab Work: None ordered If you have labs (blood work) drawn today and your tests are completely normal, you will receive your results only by: Marland Kitchen MyChart Message (if you have MyChart) OR . A paper copy in the mail If you have any lab test that is abnormal or we need to change your treatment, we will call you to review the results.   Testing/Procedures:  1.  Your physician has requested that you have an echocardiogram. Echocardiography is a painless test that uses sound waves to create images of your heart. It provides your doctor with information about the size and shape of your heart and how well your heart's chambers and valves are working. This procedure takes approximately one hour. There are no restrictions for this procedure.     Follow-Up: At [facility], you and your health needs are our priority.  As part of our continuing mission to provide you with exceptional heart care, we have created designated Provider Care Teams.  These Care Teams include your primary Cardiologist (physician) and Advanced Practice Providers (APPs -  Physician Assistants and Nurse Practitioners) who all work together to provide you with the care you need, when you need it.  We recommend signing up for the patient portal called "MyChart".  Sign up information is provided on this After Visit Summary.  MyChart is used to connect with patients for Virtual Visits (Telemedicine).  Patients are able to view lab/test results, encounter notes, upcoming appointments, etc.  Non-urgent messages can be sent to your provider as well.   To learn more about what you can do with MyChart, go to [email or web address].    Your next appointment:    Follow up after echo    The format for your next appointment:   In Person  Provider:   Kate Sable, MD   Other Instructions

## 2021-02-11 NOTE — Progress Notes (Signed)
Cardiology Office Note:    Date:  02/11/2021   ID:  [name], DOB 06/24/1941, MRN [id number]  PCP:  Chrismon, Dennis E, [place]  Cardiologist:  Kate Sable, MD  Advanced Practice Provider:  No care team member to display Electrophysiologist:  None       Referring MD: Margo Common, PA-C   Chief Complaint  Patient presents with  . New Patient (Initial Visit)    Self referral for uncontrolled blood pressure & heart murmur. Patient c/o LE edema. Medications reviewed by the patient verbally.     History of Present Illness:    [name] is a 80 y.o. male with a hx of hypertension, heart murmur who presents due to elevated blood pressures.  Patient states being diagnosed with hypertension for many years.  Typically takes amlodipine 10 mg daily.  Over the past 6 weeks, he has noticed elevated blood pressures with systolics in the 357I.  He denies chest pain, shortness of breath, headaches, palpitation.  Went to the ER at [facility] where he was kept for 4 hours.  Eventually followed up with primary care provider and was started on telmisartan 80 mg daily 3 days ago.  His blood pressure seems to be additional blood pressure medicine has been in the 150s to 160s.  Denies smoking, denies any history of heart disease.  Past Medical History:  Diagnosis Date  . Diabetes mellitus without complication ([place])    type 2. has not taken meds in two years   . Heart murmur    has had for years  . Hypertension   . Stone, bladder     Past Surgical History:  Procedure Laterality Date  . CATARACT EXTRACTION, BILATERAL    . CHOLECYSTECTOMY  04/1995  . CYSTOSCOPY WITH LITHOLAPAXY  02/11/2019   Procedure: CYSTOSCOPY WITH LITHOLAPAXY;  Surgeon: Billey Co, MD;  Location: [facility] ORS;  Service: Urology;;  . EYE SURGERY    . HOLEP-LASER ENUCLEATION OF THE PROSTATE WITH MORCELLATION N/A 02/11/2019   Procedure: HOLEP-LASER ENUCLEATION OF THE PROSTATE WITH  MORCELLATION;  Surgeon: Billey Co, MD;  Location: [facility] ORS;  Service: Urology;  Laterality: N/A;  . LIPOMA EXCISION  01/28/2011    Current Medications: Current Meds  Medication Sig  . amLODipine (NORVASC) 10 MG tablet Take 1 tablet (10 mg total) by mouth daily.  . Blood Glucose Monitoring Suppl (ONE TOUCH ULTRA 2) w/Device KIT OneTouch Ultra2 Meter kit  . Cinnamon Bark POWD Take 1 Scoop by mouth daily with breakfast.   . glucose blood (CONTOUR NEXT TEST) test strip Use as instructed  . glucose blood (ONETOUCH VERIO) test strip Use as instructed  . naproxen sodium (ALEVE) 220 MG tablet Take 220 mg by mouth 2 (two) times daily as needed.  Glory Rosebush VERIO test strip TEST FASTING BLOOD SUGAR ONCE DAILY  . telmisartan (MICARDIS) 80 MG tablet Take 1 tablet (80 mg total) by mouth daily.     Allergies:   Lisinopril   Social History   Socioeconomic History  . Marital status: Widowed    Spouse name: [name]  . Number of children: 5  . Years of education: Not on file  . Highest education level: 10th grade  Occupational History  . Occupation: worked at [employer] for 46 yrs    Comment: retired  Tobacco Use  . Smoking status: Former Smoker    Packs/day: 1.00    Types: Cigarettes    Quit  date: 12    Years since quitting: 79.2  . Smokeless tobacco: Former Systems developer    Types: Chew    Quit date: 2005  . Tobacco comment: 80 years old for 1 year  Vaping Use  . Vaping Use: Never used  Substance and Sexual Activity  . Alcohol use: No    Alcohol/week: 0.0 standard drinks  . Drug use: No  . Sexual activity: Not on file  Other Topics Concern  . Not on file  Social History Narrative  . Not on file   Social Determinants of Health   Financial Resource Strain: Low Risk   . Difficulty of Paying Living Expenses: Not hard at all  Food Insecurity: No Food Insecurity  . Worried About Charity fundraiser in the Last Year: Never true  . Ran Out of Food in the Last Year: Never true   Transportation Needs: No Transportation Needs  . Lack of Transportation (Medical): No  . Lack of Transportation (Non-Medical): No  Physical Activity: Inactive  . Days of Exercise per Week: 0 days  . Minutes of Exercise per Session: 0 min  Stress: No Stress Concern Present  . Feeling of Stress : Not at all  Social Connections: Moderately Integrated  . Frequency of Communication with Friends and Family: More than three times a week  . Frequency of Social Gatherings with Friends and Family: More than three times a week  . Attends Religious Services: More than 4 times per year  . Active Member of Clubs or Organizations: No  . Attends Archivist Meetings: Never  . Marital Status: Married     Family History: The patient's family history includes Alzheimer's disease in his sister; Heart attack in his brother; Lung cancer in his son.  ROS:   Please see the history of present illness.     All other systems reviewed and are negative.  EKGs/Labs/Other Studies Reviewed:    The following studies were reviewed today:   EKG:  EKG is  ordered today.  The ekg ordered today demonstrates sinus rhythm, frequent PVCs.  Recent Labs: 05/28/2020: Hemoglobin 14.4; Platelets 176 10/17/2020: ALT 15; BUN 20; Creatinine, Ser 0.60; Potassium 4.3; Sodium 139  Recent Lipid Panel    Component Value Date/Time   CHOL 118 05/28/2020 0831   TRIG 68 05/28/2020 0831   HDL 40 05/28/2020 0831   CHOLHDL 3.0 05/28/2020 0831   LDLCALC 64 05/28/2020 0831     Risk Assessment/Calculations:      Physical Exam:    VS:  BP (!) 150/60 (BP Location: Right Arm, Patient Position: Sitting, Cuff Size: Normal)   Pulse 84   Ht 5' 7"  (1.702 m)   Wt 175 lb 6 oz (79.5 kg)   SpO2 97%   BMI 27.47 kg/m     Wt Readings from Last 3 Encounters:  02/11/21 175 lb 6 oz (79.5 kg)  01/23/21 175 lb 9.6 oz (79.7 kg)  01/14/21 169 lb 15.6 oz (77.1 kg)     GEN:  Well nourished, well developed in no acute  distress HEENT: Normal NECK: No JVD; No carotid bruits LYMPHATICS: No lymphadenopathy CARDIAC: RRR, 2/6 systolic murmur RESPIRATORY:  Clear to auscultation without rales, wheezing or rhonchi  ABDOMEN: Soft, non-tender, non-distended MUSCULOSKELETAL:  No edema; No deformity  SKIN: Warm and dry NEUROLOGIC:  Alert and oriented x 3 PSYCHIATRIC:  Normal affect   ASSESSMENT:    1. Primary hypertension   2. Systolic murmur    PLAN:    In  order of problems listed above:  1. Hypertension, blood pressure improved but still elevated.  Telmisartan recently started.  Continue to monitor blood pressures on amlodipine and telmisartan.  If BP stays elevated at follow-up visit, plan to start diuretic/HCTZ.  Low-salt diet advised. 2. Systolic murmur noted on exam.  Obtain echocardiogram to evaluate any significant valvular/structural abnormalities.  Follow-up after echocardiogram.      Medication Adjustments/Labs and Tests Ordered: Current medicines are reviewed at length with the patient today.  Concerns regarding medicines are outlined above.  Orders Placed This Encounter  Procedures  . EKG 12-Lead  . ECHOCARDIOGRAM COMPLETE   No orders of the defined types were placed in this encounter.   Patient Instructions  Medication Instructions:  Your physician recommends that you continue on your current medications as directed. Please refer to the Current Medication list given to you today.  *If you need a refill on your cardiac medications before your next appointment, please call your pharmacy*   Lab Work: None ordered If you have labs (blood work) drawn today and your tests are completely normal, you will receive your results only by: Marland Kitchen MyChart Message (if you have MyChart) OR . A paper copy in the mail If you have any lab test that is abnormal or we need to change your treatment, we will call you to review the results.   Testing/Procedures:  1.  Your physician has requested that  you have an echocardiogram. Echocardiography is a painless test that uses sound waves to create images of your heart. It provides your doctor with information about the size and shape of your heart and how well your heart's chambers and valves are working. This procedure takes approximately one hour. There are no restrictions for this procedure.     Follow-Up: At [facility], you and your health needs are our priority.  As part of our continuing mission to provide you with exceptional heart care, we have created designated Provider Care Teams.  These Care Teams include your primary Cardiologist (physician) and Advanced Practice Providers (APPs -  Physician Assistants and Nurse Practitioners) who all work together to provide you with the care you need, when you need it.  We recommend signing up for the patient portal called "MyChart".  Sign up information is provided on this After Visit Summary.  MyChart is used to connect with patients for Virtual Visits (Telemedicine).  Patients are able to view lab/test results, encounter notes, upcoming appointments, etc.  Non-urgent messages can be sent to your provider as well.   To learn more about what you can do with MyChart, go to [email or web address].    Your next appointment:   Follow up after echo    The format for your next appointment:   In Person  Provider:   Kate Sable, MD   Other Instructions      Signed, Kate Sable, MD  02/11/2021 11:17 AM    [place]

## 2021-03-05 ENCOUNTER — Ambulatory Visit (INDEPENDENT_AMBULATORY_CARE_PROVIDER_SITE_OTHER): Payer: Medicare HMO

## 2021-03-05 ENCOUNTER — Other Ambulatory Visit: Payer: Self-pay

## 2021-03-05 DIAGNOSIS — R011 Cardiac murmur, unspecified: Secondary | ICD-10-CM | POA: Diagnosis not present

## 2021-03-05 LAB — ECHOCARDIOGRAM COMPLETE
AR max vel: 1.67 cm2
AV Area VTI: 1.81 cm2
AV Area mean vel: 1.81 cm2
AV Mean grad: 10 mmHg
AV Peak grad: 17.7 mmHg
Ao pk vel: 2.1 m/s
Area-P 1/2: 3.85 cm2
S' Lateral: 2.8 cm

## 2021-03-08 ENCOUNTER — Encounter: Payer: Medicare HMO | Admitting: Family Medicine

## 2021-03-14 ENCOUNTER — Other Ambulatory Visit: Payer: Self-pay

## 2021-03-14 ENCOUNTER — Ambulatory Visit: Payer: Medicare HMO | Admitting: Cardiology

## 2021-03-14 ENCOUNTER — Encounter: Payer: Self-pay | Admitting: Cardiology

## 2021-03-14 VITALS — BP 152/76 | HR 74 | Wt 180.0 lb

## 2021-03-14 DIAGNOSIS — I350 Nonrheumatic aortic (valve) stenosis: Secondary | ICD-10-CM

## 2021-03-14 DIAGNOSIS — I10 Essential (primary) hypertension: Secondary | ICD-10-CM

## 2021-03-14 MED ORDER — HYDROCHLOROTHIAZIDE 25 MG PO TABS: 25.0000 mg | ORAL_TABLET | Freq: Every day | ORAL | 3 refills | Status: DC

## 2021-03-14 NOTE — Patient Instructions (Signed)
Medication Instructions:  Your physician has recommended you make the following change in your medication:   1.  START taking Hydrochlorothiazide 25 MG once daily.  *If you need a refill on your cardiac medications before your next appointment, please call your pharmacy*   Lab Work: None ordered If you have labs (blood work) drawn today and your tests are completely normal, you will receive your results only by: Marland Kitchen MyChart Message (if you have MyChart) OR . A paper copy in the mail If you have any lab test that is abnormal or we need to change your treatment, we will call you to review the results.   Testing/Procedures: None ordered   Follow-Up: At [facility], you and your health needs are our priority.  As part of our continuing mission to provide you with exceptional heart care, we have created designated Provider Care Teams.  These Care Teams include your primary Cardiologist (physician) and Advanced Practice Providers (APPs -  Physician Assistants and Nurse Practitioners) who all work together to provide you with the care you need, when you need it.  We recommend signing up for the patient portal called "MyChart".  Sign up information is provided on this After Visit Summary.  MyChart is used to connect with patients for Virtual Visits (Telemedicine).  Patients are able to view lab/test results, encounter notes, upcoming appointments, etc.  Non-urgent messages can be sent to your provider as well.   To learn more about what you can do with MyChart, go to [email or web address].    Your next appointment:   2 month(s)  The format for your next appointment:   In Person  Provider:   Kate Sable, MD   Other Instructions

## 2021-03-14 NOTE — Progress Notes (Signed)
Cardiology Office Note:    Date:  03/14/2021   ID:  Shaquille T Forinash, DOB 07/02/1941, MRN [id number]  PCP:  Chrismon, Dennis E, PA-C   [facility]  Cardiologist:  Brian Agbor-Etang, MD  Advanced Practice Provider:  No care team member to display Electrophysiologist:  None       Referring MD: Chrismon, Dennis E, PA-C   Chief Complaint  Patient presents with  . Other    Follow up post ECHO - Patient c/o fluid in feet. Meds reviewed    History of Present Illness:    [place] T Colomb is a 80 y.o. male with a hx of hypertension, heart murmur who presents for follow-up.  He was previously seen due to elevated blood pressures and systolic murmur.  Was recently started on telmisartan, tolerating amlodipine 10 mg daily.  Echocardiogram was obtained to evaluate systolic function.  Also endorses some lower extremity edema which is worse when he is on his feet for long.  He takes all his medications as prescribed, blood pressures currently running around 130s to 150s.   Past Medical History:  Diagnosis Date  . Diabetes mellitus without complication ([facility])    type 2. has not taken meds in two years   . Heart murmur    has had for years  . Hypertension   . Stone, bladder     Past Surgical History:  Procedure Laterality Date  . CATARACT EXTRACTION, BILATERAL    . CHOLECYSTECTOMY  04/1995  . CYSTOSCOPY WITH LITHOLAPAXY  02/11/2019   Procedure: CYSTOSCOPY WITH LITHOLAPAXY;  Surgeon: Sninsky, Brian C, MD;  Location: [facility] ORS;  Service: Urology;;  . EYE SURGERY    . HOLEP-LASER ENUCLEATION OF THE PROSTATE WITH MORCELLATION N/A 02/11/2019   Procedure: HOLEP-LASER ENUCLEATION OF THE PROSTATE WITH MORCELLATION;  Surgeon: Sninsky, Brian C, MD;  Location: [facility] ORS;  Service: Urology;  Laterality: N/A;  . LIPOMA EXCISION  01/28/2011    Current Medications: Current Meds  Medication Sig  . amLODipine (NORVASC) 10 MG tablet Take 1 tablet (10 mg total) by mouth daily.  . Blood  Glucose Monitoring Suppl (ONE TOUCH ULTRA 2) w/Device KIT OneTouch Ultra2 Meter kit  . Cinnamon Bark POWD Take 1 Scoop by mouth daily with breakfast.   . glucose blood (CONTOUR NEXT TEST) test strip Use as instructed  . glucose blood (ONETOUCH VERIO) test strip Use as instructed  . hydrochlorothiazide (HYDRODIURIL) 25 MG tablet Take 1 tablet (25 mg total) by mouth daily.  . naproxen sodium (ALEVE) 220 MG tablet Take 220 mg by mouth 2 (two) times daily as needed.  . ONETOUCH VERIO test strip TEST FASTING BLOOD SUGAR ONCE DAILY  . telmisartan (MICARDIS) 80 MG tablet Take 1 tablet (80 mg total) by mouth daily.     Allergies:   Lisinopril   Social History   Socioeconomic History  . Marital status: Widowed    Spouse name: Sally  . Number of children: 5  . Years of education: Not on file  . Highest education level: 10th grade  Occupational History  . Occupation: worked at [employer] for 46 yrs    Comment: retired  Tobacco Use  . Smoking status: Former Smoker    Packs/day: 1.00    Types: Cigarettes    Quit date: 1956    Years since quitting: 66.3  . Smokeless tobacco: Former User    Types: Chew    Quit date: 2005  . Tobacco comment: 80 years old for 1   year  Vaping Use  . Vaping Use: Never used  Substance and Sexual Activity  . Alcohol use: No    Alcohol/week: 0.0 standard drinks  . Drug use: No  . Sexual activity: Not on file  Other Topics Concern  . Not on file  Social History Narrative  . Not on file   Social Determinants of Health   Financial Resource Strain: Low Risk   . Difficulty of Paying Living Expenses: Not hard at all  Food Insecurity: No Food Insecurity  . Worried About Charity fundraiser in the Last Year: Never true  . Ran Out of Food in the Last Year: Never true  Transportation Needs: No Transportation Needs  . Lack of Transportation (Medical): No  . Lack of Transportation (Non-Medical): No  Physical Activity: Inactive  . Days of Exercise per Week:  0 days  . Minutes of Exercise per Session: 0 min  Stress: No Stress Concern Present  . Feeling of Stress : Not at all  Social Connections: Moderately Integrated  . Frequency of Communication with Friends and Family: More than three times a week  . Frequency of Social Gatherings with Friends and Family: More than three times a week  . Attends Religious Services: More than 4 times per year  . Active Member of Clubs or Organizations: No  . Attends Archivist Meetings: Never  . Marital Status: Married     Family History: The patient's family history includes Alzheimer's disease in his sister; Heart attack in his brother; Lung cancer in his son.  ROS:   Please see the history of present illness.     All other systems reviewed and are negative.  EKGs/Labs/Other Studies Reviewed:    The following studies were reviewed today:   EKG:  EKG is  ordered today.  The ekg ordered today demonstrates sinus rhythm, occasional PVCs.  Recent Labs: 05/28/2020: Hemoglobin 14.4; Platelets 176 10/17/2020: ALT 15; BUN 20; Creatinine, Ser 0.60; Potassium 4.3; Sodium 139  Recent Lipid Panel    Component Value Date/Time   CHOL 118 05/28/2020 0831   TRIG 68 05/28/2020 0831   HDL 40 05/28/2020 0831   CHOLHDL 3.0 05/28/2020 0831   LDLCALC 64 05/28/2020 0831     Risk Assessment/Calculations:      Physical Exam:    VS:  BP (!) 152/76 (BP Location: Right Arm, Patient Position: Sitting, Cuff Size: Normal)   Pulse 74   Wt 180 lb (81.6 kg)   SpO2 95%   BMI 28.19 kg/m     Wt Readings from Last 3 Encounters:  03/14/21 180 lb (81.6 kg)  02/11/21 175 lb 6 oz (79.5 kg)  01/23/21 175 lb 9.6 oz (79.7 kg)     GEN:  Well nourished, well developed in no acute distress HEENT: Normal NECK: No JVD; No carotid bruits LYMPHATICS: No lymphadenopathy CARDIAC: RRR, 2/6 systolic murmur RESPIRATORY:  Clear to auscultation without rales, wheezing or rhonchi  ABDOMEN: Soft, non-tender,  non-distended MUSCULOSKELETAL:  No edema; No deformity  SKIN: Warm and dry NEUROLOGIC:  Alert and oriented x 3 PSYCHIATRIC:  Normal affect   ASSESSMENT:    1. Primary hypertension   2. Aortic valve stenosis, etiology of cardiac valve disease unspecified    PLAN:    In order of problems listed above:  1. Hypertension, blood pressure improved but still elevated.  Start HCTZ 25 mg daily.  Continue telmisartan and amlodipine.  If BP is controlled, will consider combining telmisartan-HCTZ to improve patient compliance. 2.  Mild aortic valve stenosis noted on echo, this is likely cause for systolic murmur.  Normal systolic function, EF 55 to 60%.  Impaired relaxation.  We will plan for serial monitoring with echo.  Follow-up in 2 months.      Medication Adjustments/Labs and Tests Ordered: Current medicines are reviewed at length with the patient today.  Concerns regarding medicines are outlined above.  Orders Placed This Encounter  Procedures  . EKG 12-Lead   Meds ordered this encounter  Medications  . hydrochlorothiazide (HYDRODIURIL) 25 MG tablet    Sig: Take 1 tablet (25 mg total) by mouth daily.    Dispense:  30 tablet    Refill:  3    Patient Instructions  Medication Instructions:  Your physician has recommended you make the following change in your medication:   1.  START taking Hydrochlorothiazide 25 MG once daily.  *If you need a refill on your cardiac medications before your next appointment, please call your pharmacy*   Lab Work: None ordered If you have labs (blood work) drawn today and your tests are completely normal, you will receive your results only by: Marland Kitchen MyChart Message (if you have MyChart) OR . A paper copy in the mail If you have any lab test that is abnormal or we need to change your treatment, we will call you to review the results.   Testing/Procedures: None ordered   Follow-Up: At [facility], you and your health needs are our priority.   As part of our continuing mission to provide you with exceptional heart care, we have created designated Provider Care Teams.  These Care Teams include your primary Cardiologist (physician) and Advanced Practice Providers (APPs -  Physician Assistants and Nurse Practitioners) who all work together to provide you with the care you need, when you need it.  We recommend signing up for the patient portal called "MyChart".  Sign up information is provided on this After Visit Summary.  MyChart is used to connect with patients for Virtual Visits (Telemedicine).  Patients are able to view lab/test results, encounter notes, upcoming appointments, etc.  Non-urgent messages can be sent to your provider as well.   To learn more about what you can do with MyChart, go to [email or web address].    Your next appointment:   2 month(s)  The format for your next appointment:   In Person  Provider:   Kate Sable, MD   Other Instructions      Signed, Kate Sable, MD  03/14/2021 12:01 PM    [place]

## 2021-04-01 ENCOUNTER — Encounter: Payer: Medicare HMO | Admitting: Family Medicine

## 2021-04-01 NOTE — Progress Notes (Deleted)
{This patient's chart is due for periodic physician review. Please check 'Cosign Required' and forward to your supervising physician.:1}  Complete physical exam   Patient: [name]   DOB: 19-Dec-1940   80 y.o. Male  MRN: [id number] Visit Date: 04/01/2021  Today's healthcare provider: [name], PA-C   No chief complaint on file.  Subjective    [name] is a 80 y.o. male who presents today for a complete physical exam.  He reports consuming a {diet types:17450} diet. {Exercise:19826} He generally feels {well/fairly well/poorly:18703}. He reports sleeping {well/fairly well/poorly:18703}. He {does/does not:200015} have additional problems to discuss today.  HPI  ***  Past Medical History:  Diagnosis Date  . Diabetes mellitus without complication ([place])    type 2. has not taken meds in two years   . Heart murmur    has had for years  . Hypertension   . Stone, bladder    Past Surgical History:  Procedure Laterality Date  . CATARACT EXTRACTION, BILATERAL    . CHOLECYSTECTOMY  04/1995  . CYSTOSCOPY WITH LITHOLAPAXY  02/11/2019   Procedure: CYSTOSCOPY WITH LITHOLAPAXY;  Surgeon: Billey Co, MD;  Location: [facility] ORS;  Service: Urology;;  . EYE SURGERY    . HOLEP-LASER ENUCLEATION OF THE PROSTATE WITH MORCELLATION N/A 02/11/2019   Procedure: HOLEP-LASER ENUCLEATION OF THE PROSTATE WITH MORCELLATION;  Surgeon: Billey Co, MD;  Location: [facility];  Service: Urology;  Laterality: N/A;  . LIPOMA EXCISION  01/28/2011   Social History   Socioeconomic History  . Marital status: Widowed    Spouse name: [name]  . Number of children: 5  . Years of education: Not on file  . Highest education level: 10th grade  Occupational History  . Occupation: worked at [employer] for 46 yrs    Comment: retired  Tobacco Use  . Smoking status: Former Smoker    Packs/day: 1.00    Types: Cigarettes    Quit date: 1956    Years since quitting: 66.3  . Smokeless tobacco:  Former Systems developer    Types: Chew    Quit date: 2005  . Tobacco comment: 80 years old for 1 year  Vaping Use  . Vaping Use: Never used  Substance and Sexual Activity  . Alcohol use: No    Alcohol/week: 0.0 standard drinks  . Drug use: No  . Sexual activity: Not on file  Other Topics Concern  . Not on file  Social History Narrative  . Not on file   Social Determinants of Health   Financial Resource Strain: Low Risk   . Difficulty of Paying Living Expenses: Not hard at all  Food Insecurity: No Food Insecurity  . Worried About Charity fundraiser in the Last Year: Never true  . Ran Out of Food in the Last Year: Never true  Transportation Needs: No Transportation Needs  . Lack of Transportation (Medical): No  . Lack of Transportation (Non-Medical): No  Physical Activity: Inactive  . Days of Exercise per Week: 0 days  . Minutes of Exercise per Session: 0 min  Stress: No Stress Concern Present  . Feeling of Stress : Not at all  Social Connections: Moderately Integrated  . Frequency of Communication with Friends and Family: More than three times a week  . Frequency of Social Gatherings with Friends and Family: More than three times a week  . Attends Religious Services: More than 4 times per year  . Active Member of Clubs or Organizations: No  .  Attends Archivist Meetings: Never  . Marital Status: Married  Human resources officer Violence: Not At Risk  . Fear of Current or Ex-Partner: No  . Emotionally Abused: No  . Physically Abused: No  . Sexually Abused: No   Family Status  Relation Name Status  . Mother  Deceased at age 81       unsure  . Father  Deceased at age 46       unsure  . Sister 1 Alive  . Brother 1 Deceased  . Sister 2 Alive  . Sister 3 Deceased  . Sister 4 Alive  . Sister 5 Alive  . Brother 2 Alive  . Brother 3 Alive  . Brother 4 Alive  . Son  Alive  . Son  Alive  . Son  Alive  . Son  Alive   Family History  Problem Relation Age of Onset  . Heart  attack Brother   . Alzheimer's disease Sister   . Lung cancer Son    Allergies  Allergen Reactions  . Lisinopril Cough    Patient Care Team: Chrismon, Driscilla Grammes as PCP - General (Physician Assistant) Kate Sable, MD as PCP - Cardiology (Cardiology) Odette Fraction as Consulting Physician (Optometry)   Medications: Outpatient Medications Prior to Visit  Medication Sig  . amLODipine (NORVASC) 10 MG tablet Take 1 tablet (10 mg total) by mouth daily.  . Blood Glucose Monitoring Suppl (ONE TOUCH ULTRA 2) w/Device KIT OneTouch Ultra2 Meter kit  . Cinnamon Bark POWD Take 1 Scoop by mouth daily with breakfast.   . glucose blood (CONTOUR NEXT TEST) test strip Use as instructed  . glucose blood (ONETOUCH VERIO) test strip Use as instructed  . hydrochlorothiazide (HYDRODIURIL) 25 MG tablet Take 1 tablet (25 mg total) by mouth daily.  . naproxen sodium (ALEVE) 220 MG tablet Take 220 mg by mouth 2 (two) times daily as needed.  Glory Rosebush VERIO test strip TEST FASTING BLOOD SUGAR ONCE DAILY  . telmisartan (MICARDIS) 80 MG tablet Take 1 tablet (80 mg total) by mouth daily.   No facility-administered medications prior to visit.    Review of Systems  Constitutional: Negative.   HENT: Negative.   Eyes: Negative.   Respiratory: Negative.   Cardiovascular: Negative.   Gastrointestinal: Negative.   Endocrine: Negative.   Genitourinary: Negative.   Musculoskeletal: Negative.   Skin: Negative.   Allergic/Immunologic: Negative.   Neurological: Negative.   Hematological: Negative.   Psychiatric/Behavioral: Negative.     {Labs  Heme  Chem  Endocrine  Serology  Results Review (optional):23779::" "}  Objective    There were no vitals taken for this visit. {Show previous vital signs (optional):23777::" "}  Physical Exam Constitutional:      Appearance: Normal appearance. He is normal weight.  HENT:     Head: Normocephalic and atraumatic.     Right Ear: Tympanic  membrane, ear canal and external ear normal.     Left Ear: Tympanic membrane, ear canal and external ear normal.     Nose: Nose normal.     Mouth/Throat:     Mouth: Mucous membranes are moist.     Pharynx: Oropharynx is clear.  Eyes:     Extraocular Movements: Extraocular movements intact.     Conjunctiva/sclera: Conjunctivae normal.     Pupils: Pupils are equal, round, and reactive to light.  Cardiovascular:     Rate and Rhythm: Normal rate and regular rhythm.     Pulses: Normal pulses.  Heart sounds: Normal heart sounds.  Pulmonary:     Effort: Pulmonary effort is normal.     Breath sounds: Normal breath sounds.  Abdominal:     General: Abdomen is flat. Bowel sounds are normal.     Palpations: Abdomen is soft.  Musculoskeletal:        General: Normal range of motion.     Cervical back: Normal range of motion and neck supple.  Skin:    General: Skin is warm and dry.  Neurological:     General: No focal deficit present.     Mental Status: He is alert and oriented to person, place, and time. Mental status is at baseline.  Psychiatric:        Mood and Affect: Mood normal.        Behavior: Behavior normal.        Thought Content: Thought content normal.        Judgment: Judgment normal.     ***  Last depression screening scores PHQ 2/9 Scores 12/04/2020 10/16/2020 11/29/2019  PHQ - 2 Score 0 0 0  PHQ- 9 Score - - -   Last fall risk screening Fall Risk  12/04/2020  Falls in the past year? 0  Number falls in past yr: 0  Injury with Fall? 0   Last Audit-C alcohol use screening Alcohol Use Disorder Test (AUDIT) 12/04/2020  1. How often do you have a drink containing alcohol? 0  2. How many drinks containing alcohol do you have on a typical day when you are drinking? 0  3. How often do you have six or more drinks on one occasion? 0  AUDIT-C Score 0  Alcohol Brief Interventions/Follow-up AUDIT Score <7 follow-up not indicated   A score of 3 or more in women, and 4 or more  in men indicates increased risk for alcohol abuse, EXCEPT if all of the points are from question 1   No results found for any visits on 04/01/21.  Assessment & Plan    Routine Health Maintenance and Physical Exam  Exercise Activities and Dietary recommendations Goals    . Exercise 3x per week (30 min per time)     Recommend to start exercising 3 days a week for 30 minutes at a time.       Immunization History  Administered Date(s) Administered  . Fluad Quad(high Dose 65+) 08/10/2019, 10/16/2020  . Influenza, High Dose Seasonal PF 11/14/2016, 09/17/2018  . Influenza, Seasonal, Injecte, Preservative Fre 09/20/2012  . Influenza-Unspecified 09/17/2017  . Pneumococcal Conjugate-13 11/27/2017  . Tdap 06/13/2016    Health Maintenance  Topic Date Due  . COVID-19 Vaccine (1) Never done  . PNA vac Low Risk Adult (2 of 2 - PPSV23) 11/27/2018  . OPHTHALMOLOGY EXAM  12/11/2020  . FOOT EXAM  01/04/2021  . HEMOGLOBIN A1C  04/16/2021  . INFLUENZA VACCINE  07/01/2021  . TETANUS/TDAP  06/13/2026  . HPV VACCINES  Aged Out    Discussed health benefits of physical activity, and encouraged him to engage in regular exercise appropriate for his age and condition.  ***  No follow-ups on file.     {provider attestation***:1}   [name], Hershal Coria  [facility] [phone] (phone) [phone] (fax)  [place]

## 2021-04-29 ENCOUNTER — Other Ambulatory Visit: Payer: Self-pay | Admitting: Family Medicine

## 2021-04-29 DIAGNOSIS — I10 Essential (primary) hypertension: Secondary | ICD-10-CM

## 2021-04-30 ENCOUNTER — Ambulatory Visit (INDEPENDENT_AMBULATORY_CARE_PROVIDER_SITE_OTHER): Payer: Medicare HMO | Admitting: Family Medicine

## 2021-04-30 ENCOUNTER — Other Ambulatory Visit: Payer: Self-pay

## 2021-04-30 ENCOUNTER — Encounter: Payer: Self-pay | Admitting: Family Medicine

## 2021-04-30 VITALS — BP 112/66 | HR 74 | Temp 98.1°F | Resp 18 | Ht 67.0 in | Wt 174.0 lb

## 2021-04-30 DIAGNOSIS — N138 Other obstructive and reflux uropathy: Secondary | ICD-10-CM

## 2021-04-30 DIAGNOSIS — I10 Essential (primary) hypertension: Secondary | ICD-10-CM | POA: Diagnosis not present

## 2021-04-30 DIAGNOSIS — M4726 Other spondylosis with radiculopathy, lumbar region: Secondary | ICD-10-CM

## 2021-04-30 DIAGNOSIS — E786 Lipoprotein deficiency: Secondary | ICD-10-CM

## 2021-04-30 DIAGNOSIS — N401 Enlarged prostate with lower urinary tract symptoms: Secondary | ICD-10-CM | POA: Diagnosis not present

## 2021-04-30 DIAGNOSIS — E1100 Type 2 diabetes mellitus with hyperosmolarity without nonketotic hyperglycemic-hyperosmolar coma (NKHHC): Secondary | ICD-10-CM

## 2021-04-30 NOTE — Progress Notes (Signed)
I,[name],acting as a Education administrator for [employer], PA-C.,have documented all relevant documentation on the behalf of John Murders, PA-C,as directed by  [employer], PA-C while in the presence of [employer], PA-C.  Complete physical exam   Patient: [name]   DOB: October 19, 1941   80 y.o. Male  MRN: [id number] Visit Date: 04/30/2021  Today's healthcare provider: [name], PA-C   Chief Complaint  Patient presents with   Annual Exam   Subjective    [name] is a 80 y.o. male who presents today for a complete physical exam.  He reports consuming a general diet. Exercise is limited by orthopedic condition(s): n/a. He generally feels fairly well. He reports sleeping well. He does not have additional problems to discuss today.    Patient had AWV with [facility] on 12/04/2020.  Past Medical History:  Diagnosis Date   Diabetes mellitus without complication ([place])    type 2. has not taken meds in two years    Heart murmur    has had for years   Hypertension    Stone, bladder    Past Surgical History:  Procedure Laterality Date   CATARACT EXTRACTION, BILATERAL     CHOLECYSTECTOMY  04/1995   CYSTOSCOPY WITH LITHOLAPAXY  02/11/2019   Procedure: CYSTOSCOPY WITH LITHOLAPAXY;  Surgeon: Billey Co, MD;  Location: [facility];  Service: Urology;;   EYE SURGERY     HOLEP-LASER ENUCLEATION OF THE PROSTATE WITH MORCELLATION N/A 02/11/2019   Procedure: HOLEP-LASER ENUCLEATION OF THE PROSTATE WITH MORCELLATION;  Surgeon: Billey Co, MD;  Location: [facility];  Service: Urology;  Laterality: N/A;   LIPOMA EXCISION  01/28/2011   Social History   Socioeconomic History   Marital status: Widowed    Spouse name: [name]   Number of children: 5   Years of education: Not on file   Highest education level: 10th grade  Occupational History   Occupation: worked at [employer] for 36 yrs    Comment: retired  Tobacco Use   Smoking status: Former Smoker     Packs/day: 1.00    Types: Cigarettes    Quit date: 1956    Years since quitting: 66.4   Smokeless tobacco: Former Systems developer    Types: Chew    Quit date: 2005   Tobacco comment: 80 years old for 1 year  Vaping Use   Vaping Use: Never used  Substance and Sexual Activity   Alcohol use: No    Alcohol/week: 0.0 standard drinks   Drug use: No   Sexual activity: Not on file  Other Topics Concern   Not on file  Social History Narrative   Not on file   Social Determinants of Health   Financial Resource Strain: Low Risk    Difficulty of Paying Living Expenses: Not hard at all  Food Insecurity: No Food Insecurity   Worried About Charity fundraiser in the Last Year: Never true   [place] in the Last Year: Never true  Transportation Needs: No Transportation Needs   Lack of Transportation (Medical): No   Lack of Transportation (Non-Medical): No  Physical Activity: Inactive   Days of Exercise per Week: 0 days   Minutes of Exercise per Session: 0 min  Stress: No Stress Concern Present   Feeling of Stress : Not at all  Social Connections: Moderately Integrated   Frequency of Communication with Friends and Family: More than three times a week   Frequency  of Social Gatherings with Friends and Family: More than three times a week   Attends Religious Services: More than 4 times per year   Active Member of [employer] or Organizations: No   Attends Music therapist: Never   Marital Status: Married  Human resources officer Violence: Not At Risk   Fear of Current or Ex-Partner: No   Emotionally Abused: No   Physically Abused: No   Sexually Abused: No   Family Status  Relation Name Status   Mother  Deceased at age 37       unsure   Father  Deceased at age 13       unsure   Sister 53 Alive   Brother 1 Deceased   Sister 2 Alive   Sister 3 Deceased   Sister 1 Alive   Sister 38 Alive   Brother 2 Alive   Brother 3 Alive   Brother 4 Alive   Son  Alive   Son  Alive   Son  Alive    Son  Alive   Family History  Problem Relation Age of Onset   Heart attack Brother    Alzheimer's disease Sister    Lung cancer Son    Allergies  Allergen Reactions   Lisinopril Cough    Patient Care Team: Maximo Spratling, Vickki Muff, PA-C as PCP - General (Physician Assistant) Kate Sable, MD as PCP - Cardiology (Cardiology) Odette Fraction as Consulting Physician (Optometry)   Medications: Outpatient Medications Prior to Visit  Medication Sig   amLODipine (NORVASC) 10 MG tablet TAKE 1 TABLET BY MOUTH EVERY DAY   Blood Glucose Monitoring Suppl (ONE TOUCH ULTRA 2) w/Device KIT OneTouch Ultra2 Meter kit   Cinnamon Bark POWD Take 1 Scoop by mouth daily with breakfast.    glucose blood (CONTOUR NEXT TEST) test strip Use as instructed   glucose blood (ONETOUCH VERIO) test strip Use as instructed   hydrochlorothiazide (HYDRODIURIL) 25 MG tablet Take 1 tablet (25 mg total) by mouth daily.   naproxen sodium (ALEVE) 220 MG tablet Take 220 mg by mouth 2 (two) times daily as needed.   ONETOUCH VERIO test strip TEST FASTING BLOOD SUGAR ONCE DAILY   telmisartan (MICARDIS) 80 MG tablet Take 1 tablet (80 mg total) by mouth daily.   No facility-administered medications prior to visit.    Review of Systems  All other systems reviewed and are negative.   Last CBC Lab Results  Component Value Date   WBC 5.9 05/28/2020   HGB 14.4 05/28/2020   HCT 43.1 05/28/2020   MCV 91 05/28/2020   MCH 30.3 05/28/2020   RDW 12.3 05/28/2020   PLT 176 70/96/4383   Last metabolic panel Lab Results  Component Value Date   GLUCOSE 90 10/17/2020   NA 139 10/17/2020   K 4.3 10/17/2020   CL 101 10/17/2020   CO2 24 10/17/2020   BUN 20 10/17/2020   CREATININE 0.60 (L) 10/17/2020   GFRNONAA 94 10/17/2020   GFRAA 109 10/17/2020   CALCIUM 9.2 10/17/2020   PROT 6.3 10/17/2020   ALBUMIN 4.0 10/17/2020   LABGLOB 2.3 10/17/2020   AGRATIO 1.7 10/17/2020   BILITOT 0.6 10/17/2020   ALKPHOS 81 10/17/2020    AST 16 10/17/2020   ALT 15 10/17/2020   ANIONGAP 8 01/19/2019   Last lipids Lab Results  Component Value Date   CHOL 118 05/28/2020   HDL 40 05/28/2020   LDLCALC 64 05/28/2020   TRIG 68 05/28/2020   CHOLHDL 3.0 05/28/2020  Last hemoglobin A1c Lab Results  Component Value Date   HGBA1C 4.9 10/17/2020   Last thyroid functions Lab Results  Component Value Date   TSH 2.060 01/06/2020      Objective    BP 112/66 (BP Location: Right Arm, Patient Position: Sitting, Cuff Size: Normal)   Pulse 74   Temp 98.1 F (36.7 C) (Oral)   Resp 18   Ht _0  (1.702 m)   Wt 174 lb (78.9 kg)   SpO2 97%   BMI 27.25 kg/m  BP Readings from Last 3 Encounters:  04/30/21 112/66  03/14/21 (!) 152/76  02/11/21 (!) 150/60   Wt Readings from Last 3 Encounters:  04/30/21 174 lb (78.9 kg)  03/14/21 180 lb (81.6 kg)  02/11/21 175 lb 6 oz (79.5 kg)     Physical Exam Constitutional:      Appearance: He is well-developed.  HENT:     Head: Normocephalic and atraumatic.     Right Ear: External ear normal.     Left Ear: External ear normal.     Nose: Nose normal.  Eyes:     General:        Right eye: No discharge.     Conjunctiva/sclera: Conjunctivae normal.     Pupils: Pupils are equal, round, and reactive to light.  Neck:     Thyroid: No thyromegaly.     Trachea: No tracheal deviation.  Cardiovascular:     Rate and Rhythm: Normal rate and regular rhythm.     Heart sounds: Normal heart sounds. No murmur heard.   Pulmonary:     Effort: Pulmonary effort is normal. No respiratory distress.     Breath sounds: Normal breath sounds. No wheezing or rales.  Chest:     Chest wall: No tenderness.  Abdominal:     General: There is no distension.     Palpations: Abdomen is soft. There is no mass.     Tenderness: There is no abdominal tenderness. There is no guarding or rebound.  Genitourinary:    Comments: Deferred to urologist. Musculoskeletal:        General: No tenderness.      Cervical back: Normal range of motion and neck supple.  Lymphadenopathy:     Cervical: No cervical adenopathy.  Skin:    General: Skin is warm and dry.     Findings: No erythema or rash.  Neurological:     Mental Status: He is alert and oriented to person, place, and time.     Cranial Nerves: No cranial nerve deficit.     Motor: No abnormal muscle tone.     Coordination: Coordination normal.     Deep Tendon Reflexes: Reflexes are normal and symmetric. Reflexes normal.  Psychiatric:        Behavior: Behavior normal.        Thought Content: Thought content normal.        Judgment: Judgment normal.       Last depression screening scores PHQ 2/9 Scores 04/30/2021 12/04/2020 10/16/2020  PHQ - 2 Score 0 0 0  PHQ- 9 Score 0 - -   Last fall risk screening Fall Risk  04/30/2021  Falls in the past year? 0  Number falls in past yr: 0  Injury with Fall? 0  Risk for fall due to : Impaired balance/gait;Impaired mobility;Orthopedic patient  Follow up Falls evaluation completed   Last Audit-C alcohol use screening Alcohol Use Disorder Test (AUDIT) 04/30/2021  1. How often do you have a drink  containing alcohol? 0  2. How many drinks containing alcohol do you have on a typical day when you are drinking? 0  3. How often do you have six or more drinks on one occasion? 0  AUDIT-C Score 0  Alcohol Brief Interventions/Follow-up -   A score of 3 or more in women, and 4 or more in men indicates increased risk for alcohol abuse, EXCEPT if all of the points are from question 1   No results found for any visits on 04/30/21.  Assessment & Plan    Routine Health Maintenance and Physical Exam  Exercise Activities and Dietary recommendations Goals      Exercise 3x per week (30 min per time)     Recommend to start exercising 3 days a week for 30 minutes at a time.        Immunization History  Administered Date(s) Administered   Fluad Quad(high Dose 65+) 08/10/2019, 10/16/2020   Influenza,  High Dose Seasonal PF 11/14/2016, 09/17/2018   Influenza, Seasonal, Injecte, Preservative Fre 09/20/2012   Influenza-Unspecified 09/17/2017   Pneumococcal Conjugate-13 11/27/2017   Tdap 06/13/2016    Health Maintenance  Topic Date Due   COVID-19 Vaccine (1) Never done   Zoster Vaccines- Shingrix (1 of 2) Never done   PNA vac Low Risk Adult (2 of 2 - PPSV23) 11/27/2018   OPHTHALMOLOGY EXAM  12/11/2020   FOOT EXAM  01/04/2021   HEMOGLOBIN A1C  04/16/2021   INFLUENZA VACCINE  07/01/2021   TETANUS/TDAP  06/13/2026   HPV VACCINES  Aged Out    Discussed health benefits of physical activity, and encouraged him to engage in regular exercise appropriate for his age and condition.  1. Essential (primary) hypertension Good control with Micardis, HCTZ and Norvasc daily.  - Lipid panel - Basic Metabolic Panel (BMET)  2. Type 2 diabetes mellitus with hyperosmolarity without coma, without long-term current use of insulin ([place]) Good control with Hgb A1C 4.9% on 10-17-20. Continue low fat diabetic diet and recheck labs. - Hemoglobin B6L - Basic Metabolic Panel (BMET)  3. Low HDL (under 40) Treated by diet and remaining physically active. Recheck labs. - Lipid panel - Basic Metabolic Panel (BMET)  4. Osteoarthritis of spine with radiculopathy, lumbar region Limping gait with inability to stand up straight due to osteoarthritis. Some pains into the legs occasionally. No weakness or numbness.  5. BPH with urinary obstruction Followed by urologist - Basic Metabolic Panel (BMET) - PSA   No follow-ups on file.     I, Hermann Dottavio, PA-C, have reviewed all documentation for this visit. The documentation on 04/30/21 for the exam, diagnosis, procedures, and orders are all accurate and complete.    John Murders, PA-C  [employer] [phone] (phone) [phone] (fax)  [place]

## 2021-04-30 NOTE — Telephone Encounter (Signed)
   Notes to clinic: Patient has appt today  Review for refill    Requested Prescriptions  Pending Prescriptions Disp Refills   amLODipine (NORVASC) 10 MG tablet [Pharmacy Med Name: AMLODIPINE BESYLATE 10 MG TAB] 90 tablet 0    Sig: TAKE 1 TABLET BY MOUTH EVERY DAY      Cardiovascular:  Calcium Channel Blockers Failed - 04/29/2021  1:36 AM      Failed - Last BP in normal range    BP Readings from Last 1 Encounters:  03/14/21 (!) 152/76          Passed - Valid encounter within last 6 months    Recent Outpatient Visits           3 months ago Essential (primary) hypertension   [employer], Adriana M, PA-C   6 months ago Type 2 diabetes mellitus with hyperosmolarity without coma, without long-term current use of insulin [facility])   [place], Vickki Muff, PA-C   11 months ago Type 2 diabetes mellitus with hyperosmolarity without coma, without long-term current use of insulin ([place])   [place], Vickki Muff, PA-C   1 year ago Type 2 diabetes mellitus with hyperosmolarity without coma, without long-term current use of insulin [facility])   [place], PA-C   1 year ago Pain of left upper extremity   [place], Vickki Muff, PA-C       Future Appointments             In 2 weeks Agbor-Etang, Aaron Edelman, MD [facility], [place]

## 2021-05-06 ENCOUNTER — Other Ambulatory Visit: Payer: Self-pay

## 2021-05-06 DIAGNOSIS — N401 Enlarged prostate with lower urinary tract symptoms: Secondary | ICD-10-CM | POA: Diagnosis not present

## 2021-05-06 DIAGNOSIS — I10 Essential (primary) hypertension: Secondary | ICD-10-CM | POA: Diagnosis not present

## 2021-05-06 DIAGNOSIS — E1100 Type 2 diabetes mellitus with hyperosmolarity without nonketotic hyperglycemic-hyperosmolar coma (NKHHC): Secondary | ICD-10-CM | POA: Diagnosis not present

## 2021-05-06 DIAGNOSIS — N138 Other obstructive and reflux uropathy: Secondary | ICD-10-CM | POA: Diagnosis not present

## 2021-05-06 DIAGNOSIS — E786 Lipoprotein deficiency: Secondary | ICD-10-CM | POA: Diagnosis not present

## 2021-05-06 MED ORDER — TELMISARTAN 80 MG PO TABS: 80.0000 mg | ORAL_TABLET | Freq: Every day | ORAL | 0 refills | Status: DC

## 2021-05-06 NOTE — Telephone Encounter (Signed)
Please advise. Thanks.  

## 2021-05-06 NOTE — Telephone Encounter (Signed)
Patient will be out of this on Wednesday. Please call in refills ASAP to [facility] in [place].

## 2021-05-06 NOTE — Telephone Encounter (Signed)
[facility] faxed refill request for the following medications:  telmisartan (MICARDIS) 80 MG tablet  Last Rx: 02/07/21 Please advise. Thanks TNP

## 2021-05-07 LAB — BASIC METABOLIC PANEL
BUN/Creatinine Ratio: 27 — ABNORMAL HIGH (ref 10–24)
BUN: 23 mg/dL (ref 8–27)
CO2: 23 mmol/L (ref 20–29)
Calcium: 9.5 mg/dL (ref 8.6–10.2)
Chloride: 100 mmol/L (ref 96–106)
Creatinine, Ser: 0.84 mg/dL (ref 0.76–1.27)
Glucose: 106 mg/dL — ABNORMAL HIGH (ref 65–99)
Potassium: 4.1 mmol/L (ref 3.5–5.2)
Sodium: 139 mmol/L (ref 134–144)
eGFR: 88 mL/min/{1.73_m2} (ref >59)

## 2021-05-07 LAB — HEMOGLOBIN A1C
Est. average glucose Bld gHb Est-mCnc: 105 mg/dL
Hgb A1c MFr Bld: 5.3 % (ref 4.8–5.6)

## 2021-05-07 LAB — LIPID PANEL
Chol/HDL Ratio: 3.2 ratio (ref 0.0–5.0)
Cholesterol, Total: 114 mg/dL (ref 100–199)
HDL: 36 mg/dL — ABNORMAL LOW (ref >39)
LDL Chol Calc (NIH): 65 mg/dL (ref 0–99)
Triglycerides: 56 mg/dL (ref 0–149)
VLDL Cholesterol Cal: 13 mg/dL (ref 5–40)

## 2021-05-07 LAB — PSA: Prostate Specific Ag, Serum: 2.9 ng/mL (ref 0.0–4.0)

## 2021-05-17 ENCOUNTER — Other Ambulatory Visit: Payer: Self-pay

## 2021-05-17 ENCOUNTER — Ambulatory Visit: Payer: Medicare HMO | Admitting: Cardiology

## 2021-05-17 ENCOUNTER — Encounter: Payer: Self-pay | Admitting: Cardiology

## 2021-05-17 VITALS — BP 126/70 | HR 80 | Wt 174.0 lb

## 2021-05-17 DIAGNOSIS — I10 Essential (primary) hypertension: Secondary | ICD-10-CM | POA: Diagnosis not present

## 2021-05-17 DIAGNOSIS — I350 Nonrheumatic aortic (valve) stenosis: Secondary | ICD-10-CM | POA: Diagnosis not present

## 2021-05-17 NOTE — Progress Notes (Signed)
Cardiology Office Note:    Date:  05/17/2021   ID:  [name], DOB 09/10/1941, MRN [id number]  PCP:  Chrismon, Dennis E, [place]  Cardiologist:  Kate Sable, MD  Advanced Practice Provider:  No care team member to display Electrophysiologist:  None       Referring MD: Margo Common, PA-C   Chief Complaint  Patient presents with   other    2 month follow up. Meds reviewed verbally with patient.     History of Present Illness:    [name] is a 80 y.o. male with a hx of hypertension, mild aortic stenosis who presents for follow-up.    Being seen due to aortic stenosis and hypertension.  BP noted to be elevated at last visit, started on HCTZ.  He tolerates current medications as prescribed.  Blood pressures are now controlled at home.  He Feels well, has no concerns at this time.  Echocardiogram was performed to evaluate systolic murmur.  Prior notes Echocardiogram 0/9381 normal systolic function, EF 55 to 60% moderate calcification, mild aortic valve stenosis.   Past Medical History:  Diagnosis Date   Diabetes mellitus without complication ([place])    type 2. has not taken meds in two years    Heart murmur    has had for years   Hypertension    Stone, bladder     Past Surgical History:  Procedure Laterality Date   CATARACT EXTRACTION, BILATERAL     CHOLECYSTECTOMY  04/1995   CYSTOSCOPY WITH LITHOLAPAXY  02/11/2019   Procedure: CYSTOSCOPY WITH LITHOLAPAXY;  Surgeon: Billey Co, MD;  Location: [facility] ORS;  Service: Urology;;   EYE SURGERY     HOLEP-LASER ENUCLEATION OF THE PROSTATE WITH MORCELLATION N/A 02/11/2019   Procedure: HOLEP-LASER ENUCLEATION OF THE PROSTATE WITH MORCELLATION;  Surgeon: Billey Co, MD;  Location: [facility] ORS;  Service: Urology;  Laterality: N/A;   LIPOMA EXCISION  01/28/2011    Current Medications: Current Meds  Medication Sig   amLODipine (NORVASC) 10 MG tablet TAKE 1 TABLET BY MOUTH  EVERY DAY   Blood Glucose Monitoring Suppl (ONE TOUCH ULTRA 2) w/Device KIT OneTouch Ultra2 Meter kit   Cinnamon Bark POWD Take 1 Scoop by mouth daily with breakfast.    glucose blood (CONTOUR NEXT TEST) test strip Use as instructed   glucose blood (ONETOUCH VERIO) test strip Use as instructed   hydrochlorothiazide (HYDRODIURIL) 25 MG tablet Take 1 tablet (25 mg total) by mouth daily.   naproxen sodium (ALEVE) 220 MG tablet Take 220 mg by mouth 2 (two) times daily as needed.   ONETOUCH VERIO test strip TEST FASTING BLOOD SUGAR ONCE DAILY   telmisartan (MICARDIS) 80 MG tablet Take 1 tablet (80 mg total) by mouth daily.     Allergies:   Lisinopril   Social History   Socioeconomic History   Marital status: Widowed    Spouse name: [name]   Number of children: 5   Years of education: Not on file   Highest education level: 10th grade  Occupational History   Occupation: worked at [employer] for 46 yrs    Comment: retired  Tobacco Use   Smoking status: Former    Packs/day: 1.00    Pack years: 0.00    Types: Cigarettes    Quit date: 1956    Years since quitting: 66.5   Smokeless tobacco: Former    Types: Chew    Quit date: 2005  Tobacco comments:    80 years old for 1 year  Vaping Use   Vaping Use: Never used  Substance and Sexual Activity   Alcohol use: No    Alcohol/week: 0.0 standard drinks   Drug use: No   Sexual activity: Not on file  Other Topics Concern   Not on file  Social History Narrative   Not on file   Social Determinants of Health   Financial Resource Strain: Low Risk    Difficulty of Paying Living Expenses: Not hard at all  Food Insecurity: No Food Insecurity   Worried About Charity fundraiser in the Last Year: Never true   [place] in the Last Year: Never true  Transportation Needs: No Transportation Needs   Lack of Transportation (Medical): No   Lack of Transportation (Non-Medical): No  Physical Activity: Inactive   Days of Exercise per  Week: 0 days   Minutes of Exercise per Session: 0 min  Stress: No Stress Concern Present   Feeling of Stress : Not at all  Social Connections: Moderately Integrated   Frequency of Communication with Friends and Family: More than three times a week   Frequency of Social Gatherings with Friends and Family: More than three times a week   Attends Religious Services: More than 4 times per year   Active Member of [employer] or Organizations: No   Attends Archivist Meetings: Never   Marital Status: Married     Family History: The patient's family history includes Alzheimer's disease in his sister; Heart attack in his brother; Lung cancer in his son.  ROS:   Please see the history of present illness.     All other systems reviewed and are negative.  EKGs/Labs/Other Studies Reviewed:    The following studies were reviewed today:   EKG:  EKG is  ordered today.  The ekg ordered today demonstrates sinus rhythm, occasional PVCs.  Recent Labs: 05/28/2020: Hemoglobin 14.4; Platelets 176 10/17/2020: ALT 15 05/06/2021: BUN 23; Creatinine, Ser 0.84; Potassium 4.1; Sodium 139  Recent Lipid Panel    Component Value Date/Time   CHOL 114 05/06/2021 0947   TRIG 56 05/06/2021 0947   HDL 36 (L) 05/06/2021 0947   CHOLHDL 3.2 05/06/2021 0947   LDLCALC 65 05/06/2021 0947     Risk Assessment/Calculations:      Physical Exam:    VS:  BP 126/70 (BP Location: Left Arm, Patient Position: Sitting, Cuff Size: Normal)   Pulse 80   Wt 174 lb (78.9 kg)   SpO2 96%   BMI 27.25 kg/m     Wt Readings from Last 3 Encounters:  05/17/21 174 lb (78.9 kg)  04/30/21 174 lb (78.9 kg)  03/14/21 180 lb (81.6 kg)     GEN:  Well nourished, well developed in no acute distress HEENT: Normal NECK: No JVD; No carotid bruits LYMPHATICS: No lymphadenopathy CARDIAC: RRR, 2/6 systolic murmur RESPIRATORY:  Clear to auscultation without rales, wheezing or rhonchi  ABDOMEN: Soft, non-tender,  non-distended MUSCULOSKELETAL:  No edema; No deformity  SKIN: Warm and dry NEUROLOGIC:  Alert and oriented x 3 PSYCHIATRIC:  Normal affect   ASSESSMENT:    1. Primary hypertension   2. Aortic valve stenosis, etiology of cardiac valve disease unspecified     PLAN:    In order of problems listed above:  Hypertension, BP controlled.  Continue HCTZ continue telmisartan and amlodipine.   Mild aortic valve stenosis noted on echo, this is likely cause for systolic  murmur.  Normal systolic function, EF 55 to 60%.  Impaired relaxation.  serial monitoring with echo.  Follow-up yearly    Medication Adjustments/Labs and Tests Ordered: Current medicines are reviewed at length with the patient today.  Concerns regarding medicines are outlined above.  Orders Placed This Encounter  Procedures   EKG 12-Lead    No orders of the defined types were placed in this encounter.   Patient Instructions  Medication Instructions:   Your physician recommends that you continue on your current medications as directed. Please refer to the Current Medication list given to you today.    *If you need a refill on your cardiac medications before your next appointment, please call your pharmacy*   Lab Work: None ordered  If you have labs (blood work) drawn today and your tests are completely normal, you will receive your results only by: [place] (if you have MyChart) OR A paper copy in the mail If you have any lab test that is abnormal or we need to change your treatment, we will call you to review the results.   Testing/Procedures: None ordered   Follow-Up: At [facility], you and your health needs are our priority.  As part of our continuing mission to provide you with exceptional heart care, we have created designated Provider Care Teams.  These Care Teams include your primary Cardiologist (physician) and Advanced Practice Providers (APPs -  Physician Assistants and Nurse  Practitioners) who all work together to provide you with the care you need, when you need it.  We recommend signing up for the patient portal called "MyChart".  Sign up information is provided on this After Visit Summary.  MyChart is used to connect with patients for Virtual Visits (Telemedicine).  Patients are able to view lab/test results, encounter notes, upcoming appointments, etc.  Non-urgent messages can be sent to your provider as well.   To learn more about what you can do with MyChart, go to [email or web address].    Your next appointment:   1 year(s)  The format for your next appointment:   In Person  Provider:   Kate Sable, MD   Other Instructions    Signed, Kate Sable, MD  05/17/2021 11:28 AM    [place]

## 2021-05-17 NOTE — Patient Instructions (Signed)

## 2021-07-12 ENCOUNTER — Other Ambulatory Visit: Payer: Self-pay

## 2021-07-12 MED ORDER — HYDROCHLOROTHIAZIDE 25 MG PO TABS: 25.0000 mg | ORAL_TABLET | Freq: Every day | ORAL | 0 refills | Status: DC

## 2021-07-30 ENCOUNTER — Other Ambulatory Visit: Payer: Self-pay | Admitting: Family Medicine

## 2021-07-30 DIAGNOSIS — I10 Essential (primary) hypertension: Secondary | ICD-10-CM

## 2021-08-23 ENCOUNTER — Telehealth: Payer: Self-pay

## 2021-08-23 NOTE — Telephone Encounter (Signed)
Copied from [place] [id number]. Topic: General - Other >> Aug 23, 2021  8:18 AM Leward Quan A wrote: Reason for CRM: Patient called in needing to schedule his appointment that he had with set in December Please call Ph# [phone]

## 2021-10-09 ENCOUNTER — Other Ambulatory Visit: Payer: Self-pay | Admitting: *Deleted

## 2021-10-09 MED ORDER — HYDROCHLOROTHIAZIDE 25 MG PO TABS: 25.0000 mg | ORAL_TABLET | Freq: Every day | ORAL | 1 refills | Status: DC

## 2021-10-28 ENCOUNTER — Telehealth: Payer: Self-pay | Admitting: Family Medicine

## 2021-10-28 DIAGNOSIS — I10 Essential (primary) hypertension: Secondary | ICD-10-CM

## 2021-10-28 MED ORDER — AMLODIPINE BESYLATE 10 MG PO TABS: 10.0000 mg | ORAL_TABLET | Freq: Every day | ORAL | 1 refills | Status: DC

## 2021-10-28 NOTE — Telephone Encounter (Signed)
[facility] faxed refill request for the following medications:  amLODipine (NORVASC) 10 MG tablet    Please advise.  

## 2021-11-08 NOTE — Progress Notes (Signed)
Established patient visit   Patient: [name]   DOB: [birth year]   80 y.o. Male  MRN: [id number] Visit Date: 11/11/2021  Today's healthcare provider: Gwyneth Sprout, FNP   Chief Complaint  Patient presents with  . Hypertension  . Diabetes   Subjective    HPI  -Would like to receive flu vaccine Hypertension, follow-up  BP Readings from Last 3 Encounters:  11/11/21 100/80  05/17/21 126/70  04/30/21 112/66   Wt Readings from Last 3 Encounters:  11/11/21 172 lb 12.8 oz (78.4 kg)  05/17/21 174 lb (78.9 kg)  04/30/21 174 lb (78.9 kg)     He was last seen for hypertension 10 months ago.  BP at that visit was 159/81. Management since that visit includes continue current treatment.  He reports excellent compliance with treatment. He is not having side effects.  He is following a Regular diet. He is not exercising. He does not smoke.  Use of agents associated with hypertension: none.   Outside blood pressures are every morning Symptoms: No chest pain No chest pressure  No palpitations No syncope  No dyspnea No orthopnea  No paroxysmal nocturnal dyspnea No lower extremity edema   Pertinent labs: Lab Results  Component Value Date   CHOL 114 05/06/2021   HDL 36 (L) 05/06/2021   LDLCALC 65 05/06/2021   TRIG 56 05/06/2021   CHOLHDL 3.2 05/06/2021   Lab Results  Component Value Date   NA 139 05/06/2021   K 4.1 05/06/2021   CREATININE 0.84 05/06/2021   EGFR 88 05/06/2021   GLUCOSE 106 (H) 05/06/2021   TSH 2.060 01/06/2020     The ASCVD Risk score (Arnett DK, et al., 2019) failed to calculate for the following reasons:   The 2019 ASCVD risk score is only valid for ages 61 to 77   --------------------------------------------------------------------------------------------------- Diabetes Mellitus Type II, Follow-up  Lab Results  Component Value Date   HGBA1C 5.3 05/06/2021   HGBA1C 4.9 10/17/2020   HGBA1C 4.8 01/06/2020   Wt Readings from Last 3  Encounters:  11/11/21 172 lb 12.8 oz (78.4 kg)  05/17/21 174 lb (78.9 kg)  04/30/21 174 lb (78.9 kg)   Last seen for diabetes 10 months ago.  Management since then includes continue current treatment. He reports excellent compliance with treatment. He is not having side effects.  Symptoms: No fatigue No foot ulcerations  No appetite changes No nausea  No paresthesia of the feet  No polydipsia  No polyuria No visual disturbances   No vomiting     Home blood sugar records: fasting range: 80's-96  Episodes of hypoglycemia? No    Current insulin regiment: none Most Recent Eye Exam: appt scheduled for February Current exercise: none Current diet habits: in general, a "healthy" diet    Pertinent Labs: Lab Results  Component Value Date   CHOL 114 05/06/2021   HDL 36 (L) 05/06/2021   LDLCALC 65 05/06/2021   TRIG 56 05/06/2021   CHOLHDL 3.2 05/06/2021   Lab Results  Component Value Date   NA 139 05/06/2021   K 4.1 05/06/2021   CREATININE 0.84 05/06/2021   EGFR 88 05/06/2021   MICROALBUR Negative 01/06/2020     ---------------------------------------------------------------------------------------------------   Medications: Outpatient Medications Prior to Visit  Medication Sig  . Blood Glucose Monitoring Suppl (ONE TOUCH ULTRA 2) w/Device KIT OneTouch Ultra2 Meter kit  . Cinnamon Bark POWD Take 1 Scoop by mouth daily with breakfast.   . glucose blood (  CONTOUR NEXT TEST) test strip Use as instructed  . glucose blood (ONETOUCH VERIO) test strip Use as instructed  . hydrochlorothiazide (HYDRODIURIL) 25 MG tablet Take 1 tablet (25 mg total) by mouth daily.  . naproxen sodium (ALEVE) 220 MG tablet Take 220 mg by mouth 2 (two) times daily as needed.  Glory Rosebush VERIO test strip TEST FASTING BLOOD SUGAR ONCE DAILY  . telmisartan (MICARDIS) 80 MG tablet TAKE 1 TABLET BY MOUTH EVERY DAY  . [DISCONTINUED] amLODipine (NORVASC) 10 MG tablet Take 1 tablet (10 mg total) by mouth  daily.   No facility-administered medications prior to visit.    Review of Systems     Objective    BP 100/80 (BP Location: Right Arm, Patient Position: Sitting, Cuff Size: Large) Comment: home- 100/70-90  Pulse 71   Temp 98 F (36.7 C) (Oral)   Wt 172 lb 12.8 oz (78.4 kg)   SpO2 98%   BMI 27.06 kg/m    Physical Exam Vitals and nursing note reviewed.  Constitutional:      Appearance: Normal appearance. He is overweight.  HENT:     Head: Normocephalic and atraumatic.  Eyes:     Pupils: Pupils are equal, round, and reactive to light.  Cardiovascular:     Rate and Rhythm: Normal rate and regular rhythm.     Pulses: Normal pulses.     Heart sounds: Murmur heard.  Pulmonary:     Effort: Pulmonary effort is normal.     Breath sounds: Normal breath sounds.  Musculoskeletal:        General: Normal range of motion.     Cervical back: Normal range of motion.  Skin:    General: Skin is warm and dry.     Capillary Refill: Capillary refill takes less than 2 seconds.  Neurological:     General: No focal deficit present.     Mental Status: He is alert and oriented to person, place, and time. Mental status is at baseline.  Psychiatric:        Mood and Affect: Mood normal.        Behavior: Behavior normal.        Thought Content: Thought content normal.        Judgment: Judgment normal.     No results found for any visits on 11/11/21.  Assessment & Plan     Problem List Items Addressed This Visit       Cardiovascular and Mediastinum   Essential (primary) hypertension    Chronic, stable Reports home Bps 100/70-90s Unaware of HR Denies dizziness or edema Refill of norvasc today      Relevant Medications   amLODipine (NORVASC) 10 MG tablet   Aortic valve stenosis    Was evaluated by cards- d/t aging process, stable Has 1 year return for summer 2023      Relevant Medications   amLODipine (NORVASC) 10 MG tablet     Endocrine   Diabetes mellitus, type 2 (HCC) -  Primary    A1c stable Reports that he doesn't eat anything from a can and rarely eats pork/beef-eats a lot of venison from a friend in [place] been hunting himself this year, but his friend has brought him 3 deer        Other   Systolic murmur    Heard on exam Pt denies any SOB Use of cane d/t OA      Need for influenza vaccination    Consent reviewed; provided today  Relevant Orders   Flu Vaccine QUAD High Dose(Fluad)     Return for annual examination.      Vonna Kotyk, FNP, have reviewed all documentation for this visit. The documentation on 11/11/21 for the exam, diagnosis, procedures, and orders are all accurate and complete.    Gwyneth Sprout, [place] [phone] (phone) [phone] (fax)  [place]

## 2021-11-11 ENCOUNTER — Ambulatory Visit: Payer: Self-pay | Admitting: Family Medicine

## 2021-11-11 ENCOUNTER — Other Ambulatory Visit: Payer: Self-pay

## 2021-11-11 ENCOUNTER — Ambulatory Visit (INDEPENDENT_AMBULATORY_CARE_PROVIDER_SITE_OTHER): Payer: Medicare HMO | Admitting: Family Medicine

## 2021-11-11 ENCOUNTER — Encounter: Payer: Self-pay | Admitting: Family Medicine

## 2021-11-11 VITALS — BP 100/80 | HR 71 | Temp 98.0°F | Wt 172.8 lb

## 2021-11-11 DIAGNOSIS — I350 Nonrheumatic aortic (valve) stenosis: Secondary | ICD-10-CM

## 2021-11-11 DIAGNOSIS — E1165 Type 2 diabetes mellitus with hyperglycemia: Secondary | ICD-10-CM

## 2021-11-11 DIAGNOSIS — I10 Essential (primary) hypertension: Secondary | ICD-10-CM

## 2021-11-11 DIAGNOSIS — Z23 Encounter for immunization: Secondary | ICD-10-CM

## 2021-11-11 DIAGNOSIS — Z794 Long term (current) use of insulin: Secondary | ICD-10-CM

## 2021-11-11 DIAGNOSIS — R011 Cardiac murmur, unspecified: Secondary | ICD-10-CM

## 2021-11-11 LAB — POCT GLYCOSYLATED HEMOGLOBIN (HGB A1C): Hemoglobin A1C: 4.9 % (ref 4.0–5.6)

## 2021-11-11 MED ORDER — AMLODIPINE BESYLATE 10 MG PO TABS: 10.0000 mg | ORAL_TABLET | Freq: Every day | ORAL | 3 refills | Status: DC

## 2021-11-11 NOTE — Addendum Note (Signed)
Addended by: Barnie Mort on: 11/11/2021 04:17 PM   Modules accepted: Orders

## 2021-11-11 NOTE — Assessment & Plan Note (Signed)
Consent reviewed; provided today

## 2021-11-11 NOTE — Assessment & Plan Note (Signed)
Was evaluated by cards- d/t aging process, stable Has 1 year return for summer 2023

## 2021-11-11 NOTE — Assessment & Plan Note (Signed)
Heard on exam Pt denies any SOB Use of cane d/t OA

## 2021-11-11 NOTE — Assessment & Plan Note (Signed)
Chronic, stable Reports home Bps 100/70-90s Unaware of HR Denies dizziness or edema Refill of norvasc today

## 2021-11-11 NOTE — Assessment & Plan Note (Signed)
A1c stable Reports that he doesn't eat anything from a can and rarely eats pork/beef-eats a lot of venison from a friend in [place] been hunting himself this year, but his friend has brought him 3 deer

## 2021-12-25 DIAGNOSIS — E1100 Type 2 diabetes mellitus with hyperosmolarity without nonketotic hyperglycemic-hyperosmolar coma (NKHHC): Secondary | ICD-10-CM | POA: Diagnosis not present

## 2022-01-02 DIAGNOSIS — M1990 Unspecified osteoarthritis, unspecified site: Secondary | ICD-10-CM | POA: Diagnosis not present

## 2022-01-02 DIAGNOSIS — Z87891 Personal history of nicotine dependence: Secondary | ICD-10-CM | POA: Diagnosis not present

## 2022-01-02 DIAGNOSIS — R609 Edema, unspecified: Secondary | ICD-10-CM | POA: Diagnosis not present

## 2022-01-02 DIAGNOSIS — Z7982 Long term (current) use of aspirin: Secondary | ICD-10-CM | POA: Diagnosis not present

## 2022-01-02 DIAGNOSIS — E663 Overweight: Secondary | ICD-10-CM | POA: Diagnosis not present

## 2022-01-02 DIAGNOSIS — K08109 Complete loss of teeth, unspecified cause, unspecified class: Secondary | ICD-10-CM | POA: Diagnosis not present

## 2022-01-02 DIAGNOSIS — Z809 Family history of malignant neoplasm, unspecified: Secondary | ICD-10-CM | POA: Diagnosis not present

## 2022-01-02 DIAGNOSIS — I10 Essential (primary) hypertension: Secondary | ICD-10-CM | POA: Diagnosis not present

## 2022-01-02 DIAGNOSIS — G8929 Other chronic pain: Secondary | ICD-10-CM | POA: Diagnosis not present

## 2022-01-02 DIAGNOSIS — N529 Male erectile dysfunction, unspecified: Secondary | ICD-10-CM | POA: Diagnosis not present

## 2022-01-28 ENCOUNTER — Other Ambulatory Visit: Payer: Self-pay

## 2022-01-28 ENCOUNTER — Encounter: Payer: Self-pay | Admitting: Podiatry

## 2022-01-28 ENCOUNTER — Ambulatory Visit: Payer: Medicare HMO | Admitting: Podiatry

## 2022-01-28 DIAGNOSIS — M79675 Pain in left toe(s): Secondary | ICD-10-CM | POA: Diagnosis not present

## 2022-01-28 DIAGNOSIS — B351 Tinea unguium: Secondary | ICD-10-CM

## 2022-01-28 DIAGNOSIS — M79674 Pain in right toe(s): Secondary | ICD-10-CM

## 2022-01-28 DIAGNOSIS — Q667 Congenital pes cavus, unspecified foot: Secondary | ICD-10-CM

## 2022-01-28 NOTE — Progress Notes (Signed)
°  Subjective:  Patient ID: [name], male    DOB: 04-Mar-1941,  MRN: [id number]  Chief Complaint  Patient presents with   Nail Problem    Thick and discolored nails causing discomfort  Left hallux and right 5th nail    81 y.o. male returns for the above complaint.  Patient presents with thickened elongated dystrophic toenails x10.  Pain on palpation.  Patient would like to have them debrided down his not able to go himself.  He has secondary complaint of high arch foot structure.  Patient states he is tried multiple different shoes.  He does not wear orthotics.  He would like to get orthotics.  Objective:  There were no vitals filed for this visit. Podiatric Exam: Vascular: dorsalis pedis and posterior tibial pulses are palpable bilateral. Capillary return is immediate. Temperature gradient is WNL. Skin turgor WNL  Sensorium: Normal Semmes Weinstein monofilament test. Normal tactile sensation bilaterally. Nail Exam: Pt has thick disfigured discolored nails with subungual debris noted bilateral entire nail hallux through fifth toenails.  Pain on palpation to the nails. Ulcer Exam: There is no evidence of ulcer or pre-ulcerative changes or infection. Orthopedic Exam: Pes cavus foot structure noted that is nonreducible in nature.  Unable to perform single and double heel raise. Skin: No Porokeratosis. No infection or ulcers    Assessment & Plan:   1. Pes cavus   2. Pain due to onychomycosis of toenails of both feet     Patient was evaluated and treated and all questions answered.  Pes cavus -I explained to the patient the etiology of pes cavus and management options were discussed.  Given of his high arch foot structure with a lot of pressure in the arch and the heel I believe patient will benefit from custom-made orthotics.  Patient agrees on plan like to proceed with orthotics -He was casted for orthotics.  Patient is aware of the financial cost of that as well.   Onychomycosis  with pain  -Nails palliatively debrided as below. -Educated on self-care  Procedure: Nail Debridement Rationale: pain  Type of Debridement: manual, sharp debridement. Instrumentation: Nail nipper, rotary burr. Number of Nails: 10  Procedures and Treatment: Consent by patient was obtained for treatment procedures. The patient understood the discussion of treatment and procedures well. All questions were answered thoroughly reviewed. Debridement of mycotic and hypertrophic toenails, 1 through 5 bilateral and clearing of subungual debris. No ulceration, no infection noted.  Return Visit-Office Procedure: Patient instructed to return to the office for a follow up visit 3 months for continued evaluation and treatment.  Boneta Lucks, DPM    No follow-ups on file.

## 2022-01-30 ENCOUNTER — Telehealth: Payer: Self-pay

## 2022-01-30 NOTE — Telephone Encounter (Signed)
Casts sent to central fabrication - HOLD FOR CMN °

## 2022-02-07 ENCOUNTER — Ambulatory Visit: Payer: Medicare HMO

## 2022-02-12 DIAGNOSIS — Z0100 Encounter for examination of eyes and vision without abnormal findings: Secondary | ICD-10-CM | POA: Diagnosis not present

## 2022-02-12 DIAGNOSIS — H5203 Hypermetropia, bilateral: Secondary | ICD-10-CM | POA: Diagnosis not present

## 2022-02-12 DIAGNOSIS — H35033 Hypertensive retinopathy, bilateral: Secondary | ICD-10-CM | POA: Diagnosis not present

## 2022-02-12 LAB — HM DIABETES EYE EXAM

## 2022-02-21 ENCOUNTER — Other Ambulatory Visit: Payer: Self-pay

## 2022-02-21 ENCOUNTER — Ambulatory Visit: Payer: Medicare HMO

## 2022-02-21 DIAGNOSIS — E1100 Type 2 diabetes mellitus with hyperosmolarity without nonketotic hyperglycemic-hyperosmolar coma (NKHHC): Secondary | ICD-10-CM

## 2022-02-21 DIAGNOSIS — Q667 Congenital pes cavus, unspecified foot: Secondary | ICD-10-CM

## 2022-02-21 NOTE — Progress Notes (Signed)
SITUATION ?Reason for Consult: Evaluation for Prefabricated Diabetic Shoes and Custom Diabetic Inserts. ?Patient / Caregiver Report: Patient would like well fitting shoes ? ?OBJECTIVE DATA: ?Patient History / Diagnosis:  ?  ICD-10-CM   ?1. Type 2 diabetes mellitus with hyperosmolarity without coma, without long-term current use of insulin (HCC)  E11.00   ?  ?2. Pes cavus  Q66.70   ?  ? ?Physician Treating Diabetes:  [place] ? ?Current or Previous Devices:   None and no history ? ?In-Person Foot Examination: ?Ulcers & Callousing:   None ?Deformities:    Pes Cavus ?Sensation:    Compromised  ?Shoe Size:     8W ? ?ORTHOTIC RECOMMENDATION ?Recommended Devices: ?- 1x pair prefabricated PDAC approved diabetic shoes; Patient Selected Apex X801M Size 8W ?- 3x pair custom-to-patient PDAC approved vacuum formed diabetic insoles. ? ?GOALS OF SHOES AND INSOLES ?- Reduce shear and pressure ?- Reduce / Prevent callus formation ?- Reduce / Prevent ulceration ?- Protect the fragile healing compromised diabetic foot. ? ?Patient would benefit from diabetic shoes and inserts as patient has diabetes mellitus and the patient has one or more of the following conditions: ?- History of partial or complete amputation of the foot ?- History of previous foot ulceration. ?- History of pre-ulcerative callus ?- Peripheral neuropathy with evidence of callus formation ?- Foot deformity ?- Poor circulation ? ?ACTIONS PERFORMED ?Potential out of pocket cost was communicated to patient. Patient understood and consented to measurement and casting. Patient was casted for insoles via crush box and measured for shoes via brannock device. Procedure was explained and patient tolerated procedure well. All questions were answered and concerns addressed. Casts were shipped to central fabrication for HOLD until Certificate of Medical Necessity or otherwise necessary authorization from insurance is obtained. ? ?PLAN ?Shoes are to be ordered  and casts released from hold once all appropriate paperwork is complete. Patient is to be contacted and scheduled for fitting once shoes and insoles have been fabricated and received. ? ?

## 2022-04-16 ENCOUNTER — Other Ambulatory Visit: Payer: Self-pay | Admitting: Family Medicine

## 2022-04-16 ENCOUNTER — Encounter: Payer: Self-pay | Admitting: Family Medicine

## 2022-04-16 ENCOUNTER — Ambulatory Visit (INDEPENDENT_AMBULATORY_CARE_PROVIDER_SITE_OTHER): Payer: Medicare HMO | Admitting: Family Medicine

## 2022-04-16 VITALS — BP 130/80 | HR 91 | Ht 67.0 in | Wt 174.6 lb

## 2022-04-16 DIAGNOSIS — I10 Essential (primary) hypertension: Secondary | ICD-10-CM

## 2022-04-16 DIAGNOSIS — E11A Type 2 diabetes mellitus without complications in remission: Secondary | ICD-10-CM | POA: Insufficient documentation

## 2022-04-16 DIAGNOSIS — Z7689 Persons encountering health services in other specified circumstances: Secondary | ICD-10-CM | POA: Diagnosis not present

## 2022-04-16 DIAGNOSIS — E1169 Type 2 diabetes mellitus with other specified complication: Secondary | ICD-10-CM

## 2022-04-16 DIAGNOSIS — R351 Nocturia: Secondary | ICD-10-CM

## 2022-04-16 DIAGNOSIS — E119 Type 2 diabetes mellitus without complications: Secondary | ICD-10-CM | POA: Insufficient documentation

## 2022-04-16 DIAGNOSIS — Z0000 Encounter for general adult medical examination without abnormal findings: Secondary | ICD-10-CM

## 2022-04-16 MED ORDER — HYDROCHLOROTHIAZIDE 25 MG PO TABS: 25.0000 mg | ORAL_TABLET | Freq: Every day | ORAL | 3 refills | Status: DC

## 2022-04-16 MED ORDER — AMLODIPINE BESYLATE 10 MG PO TABS: 10.0000 mg | ORAL_TABLET | Freq: Every day | ORAL | 3 refills | Status: DC

## 2022-04-16 MED ORDER — ONETOUCH ULTRA VI STRP: ORAL_STRIP | 3 refills | Status: AC

## 2022-04-16 MED ORDER — TELMISARTAN 80 MG PO TABS: 80.0000 mg | ORAL_TABLET | Freq: Every day | ORAL | 3 refills | Status: DC

## 2022-04-16 NOTE — Assessment & Plan Note (Signed)
Well-controlled DM previously ?Diet controlled off medication ?Complications - hyperlipidemia, hypertension ? ?Plan:  ?1. Remain off therapy today, diet controlled ?2. Encourage improved lifestyle - low carb, low sugar diet, reduce portion size, continue improving regular exercise ?3. Check CBG - ordered OneTouch ultra test strips check daily, bring log to next visit for review ?4. Future reconsider Statin therapy ?5. DM Foot exam done today / Advised to schedule DM ophtho exam, send record updated in chart Dr Gwynn Burly ? ?Next visit labs and A1c ?

## 2022-04-16 NOTE — Patient Instructions (Addendum)
Thank you for coming to the office today. ? ?If Podiatry needs [place] to fill out a form for Diabetic Shoes, let me know. ? ?Refilled all medicines today. ? ?Keep up the great work. ? ?OneTouch Ultra test strips ordered for you, check sugar 1 x daily. ? ?DUE for FASTING BLOOD WORK (no food or drink after midnight before the lab appointment, only water or coffee without cream/sugar on the morning of) ? ?SCHEDULE "Lab Only" visit in the morning at the clinic for lab draw in 3 MONTHS  ? ?- Make sure Lab Only appointment is at about 1 week before your next appointment, so that results will be available ? ?For Lab Results, once available within 2-3 days of blood draw, you can can log in to MyChart online to view your results and a brief explanation. Also, we can discuss results at next follow-up visit. ? ? ? ?Please schedule a Follow-up Appointment to: Return in about 3 months (around 07/17/2022) for mon/tues only - 3 month fasting lab only then 1 week later Annual Physical. ? ?If you have any other questions or concerns, please feel free to call the office or send a message through [place] [facility]. You may also schedule an earlier appointment if necessary. ? ?Additionally, you may be receiving a survey about your experience at our office within a few days to 1 week by e-mail or mail. We value your feedback. ? ?Nobie Putnam, DO ?[place] ?

## 2022-04-16 NOTE — Assessment & Plan Note (Signed)
Well-controlled HTN ?No known complications  ?  ?Plan:  ?1. Continue current BP regimen HCTZ '25mg'$  daily, Amlodipine '10mg'$  daily, Telmisartan '80mg'$  daily ?2. Encourage improved lifestyle - low sodium diet, regular exercise ?3. Continue monitor BP outside office, bring readings to next visit, if persistently >140/90 or new symptoms notify office sooner ?

## 2022-04-16 NOTE — Progress Notes (Signed)
? ?Subjective:  ? ? Patient ID: [name], male    DOB: [birth year], 81 y.o.   MRN: [id number] ? ?[name] is a 81 y.o. male presenting on 04/16/2022 for Establish Care ? ?Establish care, previous PCP [facility] ? ?HPI ? ?CHRONIC HTN ?Reports out of some meds, needs re order. ?Current Meds - HCTZ 60m daily, Amlodipine 17mdaily, Telmisartan 8067maily ?Reports good compliance, took some meds today. Tolerating well, w/o complaints. ?Denies CP, dyspnea, HA, edema, dizziness / lightheadedness ? 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 ? ?CHRONIC DM, Type 2: ?Reports no concerns. He needs glucometer test strips to check sugar. ?CBGs: 80-100+, OneTouch Glucometer check sugar once daily ?Meds: None ?Currently not on ACEi ARB ?Lifestyle: ?- Diet (diet controlled, avoiding sugar sweets carbs)  ?- Exercise (walking, uses cane) ?Denies hypoglycemia, polyuria, visual changes, numbness or tingling. ? ?His wife has passed ?He lives home alone ?He has family nearby, son lives next door, and his other family 1 daughter about 1 mile away ? ?Health Maintenance: ?Declines Prevnar 20 ? ? ?  04/30/2021  ? 11:04 AM 12/04/2020  ? 10:31 AM 10/16/2020  ?  3:41 PM  ?Depression screen PHQ 2/9  ?Decreased Interest 0 0 0  ?Down, Depressed, Hopeless 0 0 0  ?PHQ - 2 Score 0 0 0  ?Altered sleeping 0    ?Tired, decreased energy 0    ?Change in appetite 0    ?Feeling bad or failure about yourself  0    ?Trouble concentrating 0    ?Moving slowly or fidgety/restless 0    ?Suicidal thoughts 0    ?PHQ-9 Score 0    ?Difficult doing work/chores Not difficult at all    ? ? ?Past Medical History:  ?Diagnosis Date  ? Diabetes mellitus without complication ([facility] ? type 2. has not taken meds in two years   ? Heart murmur   ? has had for years  ? Hypertension   ? Stone, bladder   ? ?Past Surgical History:  ?Procedure Laterality Date  ? CATARACT  EXTRACTION, BILATERAL    ? CHOLECYSTECTOMY  04/1995  ? CYSTOSCOPY WITH LITHOLAPAXY  02/11/2019  ? Procedure: CYSTOSCOPY WITH LITHOLAPAXY;  Surgeon: SniBilley CoD;  Location: [facility] ORS;  Service: Urology;;  ? EYE SURGERY    ? HOLEP-LASER ENUCLEATION OF THE PROSTATE WITH MORCELLATION N/A 02/11/2019  ? Procedure: HOLEP-LASER ENUCLEATION OF THE PROSTATE WITH MORCELLATION;  Surgeon: SniBilley CoD;  Location: [facility] ORS;  Service: Urology;  Laterality: N/A;  ? LIPOMA EXCISION  01/28/2011  ? ?Social History  ? ?Socioeconomic History  ? Marital status: Widowed  ?  Spouse name: SalGay Filler Number of children: 5  ? Years of education: Not on file  ? Highest education level: 10th grade  ?Occupational History  ? Occupation: worked at [employer] 46 yrs  ?  Comment: retired  ?Tobacco Use  ? Smoking status: Former  ?  Packs/day: 1.00  ?  Types: Cigarettes  ?  Quit date: 19574  Years since quitting: 67.4  ? Smokeless tobacco: Former  ?  Types: Chew  ?  Quit date: 2005  ? Tobacco comments:  ?  [age]ars old for 1 year  ?Vaping Use  ? Vaping Use: Never used  ?Substance and Sexual Activity  ? Alcohol use: No  ?  Alcohol/week: 0.0 standard drinks  ?  Drug use: No  ? Sexual activity: Not on file  ?Other Topics Concern  ? Not on file  ?Social History Narrative  ? Not on file  ? ?Social Determinants of Health  ? ?Financial Resource Strain: Not on file  ?Food Insecurity: Not on file  ?Transportation Needs: Not on file  ?Physical Activity: Not on file  ?Stress: Not on file  ?Social Connections: Not on file  ?Intimate Partner Violence: Not on file  ? ?Family History  ?Problem Relation Age of Onset  ? Heart attack Brother   ? Alzheimer's disease Sister   ? Lung cancer Son   ? ?Current Outpatient Medications on File Prior to Visit  ?Medication Sig  ? Blood Glucose Monitoring Suppl (ONE TOUCH ULTRA 2) w/Device KIT OneTouch Ultra2 Meter kit  ? Cinnamon Bark POWD Take 1 Scoop by mouth daily with breakfast.   ? naproxen sodium  (ALEVE) 220 MG tablet Take 220 mg by mouth 2 (two) times daily as needed.  ? ?No current facility-administered medications on file prior to visit.  ? ? ?Review of Systems ?Per HPI unless specifically indicated above ? ? ?   ?Objective:  ?  ?BP 130/80   Pulse 91   Ht _0  (1.702 m)   Wt 174 lb 9.6 oz (79.2 kg)   SpO2 99%   BMI 27.35 kg/m?   ?Wt Readings from Last 3 Encounters:  ?04/16/22 174 lb 9.6 oz (79.2 kg)  ?11/11/21 172 lb 12.8 oz (78.4 kg)  ?05/17/21 174 lb (78.9 kg)  ?  ?Physical Exam ?Vitals and nursing note reviewed.  ?Constitutional:   ?   General: He is not in acute distress. ?   Appearance: Normal appearance. He is well-developed. He is not diaphoretic.  ?   Comments: Well-appearing, comfortable, cooperative  ?HENT:  ?   Head: Normocephalic and atraumatic.  ?Eyes:  ?   General:     ?   Right eye: No discharge.     ?   Left eye: No discharge.  ?   Conjunctiva/sclera: Conjunctivae normal.  ?Cardiovascular:  ?   Rate and Rhythm: Normal rate.  ?Pulmonary:  ?   Effort: Pulmonary effort is normal.  ?Musculoskeletal:  ?   Comments: Using cane to ambulate, gait with forward stooped posture  ?Skin: ?   General: Skin is warm and dry.  ?   Findings: No erythema or rash.  ?Neurological:  ?   Mental Status: He is alert and oriented to person, place, and time.  ?Psychiatric:     ?   Mood and Affect: Mood normal.     ?   Behavior: Behavior normal.     ?   Thought Content: Thought content normal.  ?   Comments: Well groomed, good eye contact, normal speech and thoughts  ? ?Results for orders placed or performed in visit on 04/16/22  ?HM DIABETES EYE EXAM  ?Result Value Ref Range  ? HM Diabetic Eye Exam No Retinopathy No Retinopathy  ? ?   ?Assessment & Plan:  ? ?Problem List Items Addressed This Visit   ? ? Essential (primary) hypertension - Primary  ?  Well-controlled HTN ?No known complications  ?  ?Plan:  ?1. Continue current BP regimen HCTZ 7m daily, Amlodipine 130mdaily, Telmisartan 8099maily ?2.  Encourage improved lifestyle - low sodium diet, regular exercise ?3. Continue monitor BP outside office, bring readings to next visit, if persistently >140/90 or new symptoms notify office sooner ? ?  ?  ?  Relevant Medications  ? hydrochlorothiazide (HYDRODIURIL) 25 MG tablet  ? amLODipine (NORVASC) 10 MG tablet  ? telmisartan (MICARDIS) 80 MG tablet  ? Diabetes mellitus ([place])  ?  Well-controlled DM previously ?Diet controlled off medication ?Complications - hyperlipidemia, hypertension ? ?Plan:  ?1. Remain off therapy today, diet controlled ?2. Encourage improved lifestyle - low carb, low sugar diet, reduce portion size, continue improving regular exercise ?3. Check CBG - ordered OneTouch ultra test strips check daily, bring log to next visit for review ?4. Future reconsider Statin therapy ?5. DM Foot exam done today / Advised to schedule DM ophtho exam, send record updated in chart Dr Gwynn Burly ? ?Next visit labs and A1c ?  ?  ? Relevant Medications  ? telmisartan (MICARDIS) 80 MG tablet  ? ONETOUCH ULTRA test strip  ? ?Other Visit Diagnoses   ? ? Encounter to establish care with new doctor      ? ?  ? ?  ? ? ? ?He is followed by Podiatry ? 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 ?Completed DM Foot Exam today in office, 04/16/22. See exam note. ? ?Plan ?- Proceed with ordering Diabetic Shoes when he submits order form to [place], will complete and sign, he is working w/ Podiatry already on DM shoes ?- Patient would benefit from Diabetic Shoes due to neuropathy with callus formation and great toe deformity causing pain, diabetes control is improving on current regimen, and I am continuing to monitor and manage diabetes. ? ? ?Meds ordered this encounter  ?Medications  ? hydrochlorothiazide (HYDRODIURIL) 25 MG tablet  ?  Sig: Take 1 tablet (25 mg total) by mouth daily.  ?  Dispense:   90 tablet  ?  Refill:  3  ? amLODipine (NORVASC) 10 MG tablet  ?  Sig: Take 1 tablet (10 mg total) by mouth daily.  ?  Dispense:  90 tablet  ?  Refill:  3  ? telmisartan (MICARDIS) 80 MG tablet  ?  Sig: Take 1 tablet (80 mg tota

## 2022-04-18 ENCOUNTER — Telehealth: Payer: Self-pay

## 2022-04-18 NOTE — Telephone Encounter (Signed)
CMN submitted - treating physician confirmed

## 2022-05-01 ENCOUNTER — Telehealth: Payer: Self-pay

## 2022-05-01 NOTE — Telephone Encounter (Signed)
CMN received - casts released and shoes ordered  [id number]

## 2022-05-22 ENCOUNTER — Other Ambulatory Visit: Payer: Medicare HMO

## 2022-05-27 ENCOUNTER — Ambulatory Visit (INDEPENDENT_AMBULATORY_CARE_PROVIDER_SITE_OTHER): Payer: Medicare HMO

## 2022-05-27 DIAGNOSIS — E1100 Type 2 diabetes mellitus with hyperosmolarity without nonketotic hyperglycemic-hyperosmolar coma (NKHHC): Secondary | ICD-10-CM

## 2022-06-20 ENCOUNTER — Ambulatory Visit: Payer: Medicare HMO

## 2022-07-15 ENCOUNTER — Other Ambulatory Visit: Payer: Medicare HMO

## 2022-07-18 ENCOUNTER — Encounter: Payer: Self-pay | Admitting: Cardiology

## 2022-07-18 ENCOUNTER — Ambulatory Visit: Payer: Medicare HMO | Admitting: Cardiology

## 2022-07-18 VITALS — BP 132/72 | HR 68 | Ht 66.0 in | Wt 173.2 lb

## 2022-07-18 DIAGNOSIS — I10 Essential (primary) hypertension: Secondary | ICD-10-CM

## 2022-07-18 DIAGNOSIS — I350 Nonrheumatic aortic (valve) stenosis: Secondary | ICD-10-CM

## 2022-07-18 NOTE — Progress Notes (Signed)
Cardiology Office Note:    Date:  07/18/2022   ID:  [name], DOB 05-02-1941, MRN [id number]  PCP:  Olin Hauser, [place]  Cardiologist:  Kate Sable, MD  Advanced Practice Provider:  No care team member to display Electrophysiologist:  None       Referring MD: Nobie Putnam *   Chief Complaint  Patient presents with   12 month follow up    Had episode of high heart rate    History of Present Illness:    [name] is a 81 y.o. male with a hx of hypertension, mild aortic stenosis who presents for follow-up.  Being seen for aortic stenosis and hypertension, blood pressures adequately controlled on current medications, denies chest pain or shortness of breath.  Had 1 episode of low blood pressures while working in the heat.  Systolics went to as low as 70s, came right back up after patient hydrated himself.  Feels well, no new concerns.   Prior notes Echocardiogram 08/1504 normal systolic function, EF 55 to 60% moderate calcification, mild aortic valve stenosis.   Past Medical History:  Diagnosis Date   Diabetes mellitus without complication ([place])    type 2. has not taken meds in two years    Heart murmur    has had for years   Hypertension    Stone, bladder     Past Surgical History:  Procedure Laterality Date   CATARACT EXTRACTION, BILATERAL     CHOLECYSTECTOMY  04/1995   CYSTOSCOPY WITH LITHOLAPAXY  02/11/2019   Procedure: CYSTOSCOPY WITH LITHOLAPAXY;  Surgeon: Billey Co, MD;  Location: [facility] ORS;  Service: Urology;;   EYE SURGERY     HOLEP-LASER ENUCLEATION OF THE PROSTATE WITH MORCELLATION N/A 02/11/2019   Procedure: HOLEP-LASER ENUCLEATION OF THE PROSTATE WITH MORCELLATION;  Surgeon: Billey Co, MD;  Location: [facility] ORS;  Service: Urology;  Laterality: N/A;   LIPOMA EXCISION  01/28/2011    Current Medications: Current Meds  Medication Sig   amLODipine (NORVASC) 10 MG tablet Take 1  tablet (10 mg total) by mouth daily.   aspirin 81 MG chewable tablet Chew 81 mg by mouth daily.   Blood Glucose Monitoring Suppl (ONE TOUCH ULTRA 2) w/Device KIT OneTouch Ultra2 Meter kit   Cinnamon Bark POWD Take 1 Scoop by mouth daily with breakfast.    hydrochlorothiazide (HYDRODIURIL) 25 MG tablet Take 1 tablet (25 mg total) by mouth daily.   ONETOUCH ULTRA test strip Use to check blood sugar 1 x daily   telmisartan (MICARDIS) 80 MG tablet Take 1 tablet (80 mg total) by mouth daily.   triamcinolone cream (KENALOG) 0.1 % SMARTSIG:1 Application Topical 2-3 Times Daily     Allergies:   Lisinopril   Social History   Socioeconomic History   Marital status: Widowed    Spouse name: [name]   Number of children: 5   Years of education: Not on file   Highest education level: 10th grade  Occupational History   Occupation: worked at [employer] for 45 yrs    Comment: retired  Tobacco Use   Smoking status: Former    Packs/day: 1.00    Types: Cigarettes    Quit date: 1956    Years since quitting: 67.6   Smokeless tobacco: Former    Types: Chew    Quit date: 2005   Tobacco comments:    81 years old for 1 year  Vaping Use  Vaping Use: Never used  Substance and Sexual Activity   Alcohol use: No    Alcohol/week: 0.0 standard drinks of alcohol   Drug use: No   Sexual activity: Not on file  Other Topics Concern   Not on file  Social History Narrative   Not on file   Social Determinants of Health   Financial Resource Strain: Low Risk  (12/04/2020)   Overall Financial Resource Strain (CARDIA)    Difficulty of Paying Living Expenses: Not hard at all  Food Insecurity: No Food Insecurity (12/04/2020)   Hunger Vital Sign    Worried About Running Out of Food in the Last Year: Never true    [place] in the Last Year: Never true  Transportation Needs: No Transportation Needs (12/04/2020)   PRAPARE - Hydrologist (Medical): No    Lack of  Transportation (Non-Medical): No  Physical Activity: Inactive (12/04/2020)   Exercise Vital Sign    Days of Exercise per Week: 0 days    Minutes of Exercise per Session: 0 min  Stress: No Stress Concern Present (12/04/2020)   [place]    Feeling of Stress : Not at all  Social Connections: Moderately Integrated (12/04/2020)   Social Connection and Isolation Panel [NHANES]    Frequency of Communication with Friends and Family: More than three times a week    Frequency of Social Gatherings with Friends and Family: More than three times a week    Attends Religious Services: More than 4 times per year    Active Member of [employer] or Organizations: No    Attends Archivist Meetings: Never    Marital Status: Married     Family History: The patient's family history includes Alzheimer's disease in his sister; Heart attack in his brother; Lung cancer in his son.  ROS:   Please see the history of present illness.     All other systems reviewed and are negative.  EKGs/Labs/Other Studies Reviewed:    The following studies were reviewed today:   EKG:  EKG is  ordered today.  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  Recent Lipid Panel    Component Value Date/Time   CHOL 114 05/06/2021 0947   TRIG 56 05/06/2021 0947   HDL 36 (L) 05/06/2021 0947   CHOLHDL 3.2 05/06/2021 0947   LDLCALC 65 05/06/2021 0947     Risk Assessment/Calculations:      Physical Exam:    VS:  BP 132/72 (BP Location: Left Arm, Patient Position: Sitting, Cuff Size: Normal)   Pulse 68   Ht 5' 6"  (1.676 m)   Wt 173 lb 3.2 oz (78.6 kg)   SpO2 96%   BMI 27.96 kg/m     Wt Readings from Last 3 Encounters:  07/18/22 173 lb 3.2 oz (78.6 kg)  04/16/22 174 lb 9.6 oz (79.2 kg)  11/11/21 172 lb 12.8 oz (78.4 kg)     GEN:  Well  nourished, well developed in no acute distress HEENT: Normal NECK: No JVD; No carotid bruits CARDIAC: RRR, 2/6 systolic murmur RESPIRATORY:  Clear to auscultation without rales, wheezing or rhonchi  ABDOMEN: Soft, non-tender, non-distended MUSCULOSKELETAL:  No edema; No deformity  SKIN: Warm and dry NEUROLOGIC:  Alert and oriented x 3 PSYCHIATRIC:  Normal affect   ASSESSMENT:    1. Primary  hypertension   2. Aortic valve stenosis, etiology of cardiac valve disease unspecified    PLAN:    In order of problems listed above:  Hypertension, BP controlled.  Continue HCTZ ,telmisartan and amlodipine.   Mild aortic valve stenosis noted on echo,  Normal systolic function, EF 55 to 60%.  Impaired relaxation.  Repeat echo in 1 year.  Follow-up in 1 year after repeat echo.    Medication Adjustments/Labs and Tests Ordered: Current medicines are reviewed at length with the patient today.  Concerns regarding medicines are outlined above.  Orders Placed This Encounter  Procedures   EKG 12-Lead   ECHOCARDIOGRAM COMPLETE    No orders of the defined types were placed in this encounter.    Patient Instructions  Medication Instructions:   Your physician recommends that you continue on your current medications as directed. Please refer to the Current Medication list given to you today.  *If you need a refill on your cardiac medications before your next appointment, please call your pharmacy*    Testing/Procedures:  Your physician has requested that you have an echocardiogram in 1 year. Echocardiography is a painless test that uses sound waves to create images of your heart. It provides your doctor with information about the size and shape of your heart and how well your heart's chambers and valves are working. This procedure takes approximately one hour. There are no restrictions for this procedure.    Follow-Up: At [facility], you and your health needs are our priority.  As part  of our continuing mission to provide you with exceptional heart care, we have created designated Provider Care Teams.  These Care Teams include your primary Cardiologist (physician) and Advanced Practice Providers (APPs -  Physician Assistants and Nurse Practitioners) who all work together to provide you with the care you need, when you need it.  We recommend signing up for the patient portal called "MyChart".  Sign up information is provided on this After Visit Summary.  MyChart is used to connect with patients for Virtual Visits (Telemedicine).  Patients are able to view lab/test results, encounter notes, upcoming appointments, etc.  Non-urgent messages can be sent to your provider as well.   To learn more about what you can do with MyChart, go to [email or web address].    Your next appointment:   1 year(s) after Echo  The format for your next appointment:   In Person  Provider:   Kate Sable, MD    Other Instructions   Important Information About Sugar         Signed, Kate Sable, MD  07/18/2022 9:01 AM    [place]

## 2022-07-18 NOTE — Patient Instructions (Signed)
Medication Instructions:   Your physician recommends that you continue on your current medications as directed. Please refer to the Current Medication list given to you today.  *If you need a refill on your cardiac medications before your next appointment, please call your pharmacy*    Testing/Procedures:  Your physician has requested that you have an echocardiogram in 1 year. Echocardiography is a painless test that uses sound waves to create images of your heart. It provides your doctor with information about the size and shape of your heart and how well your heart's chambers and valves are working. This procedure takes approximately one hour. There are no restrictions for this procedure.    Follow-Up: At [facility], you and your health needs are our priority.  As part of our continuing mission to provide you with exceptional heart care, we have created designated Provider Care Teams.  These Care Teams include your primary Cardiologist (physician) and Advanced Practice Providers (APPs -  Physician Assistants and Nurse Practitioners) who all work together to provide you with the care you need, when you need it.  We recommend signing up for the patient portal called "MyChart".  Sign up information is provided on this After Visit Summary.  MyChart is used to connect with patients for Virtual Visits (Telemedicine).  Patients are able to view lab/test results, encounter notes, upcoming appointments, etc.  Non-urgent messages can be sent to your provider as well.   To learn more about what you can do with MyChart, go to [email or web address].    Your next appointment:   1 year(s) after Echo  The format for your next appointment:   In Person  Provider:   Kate Sable, MD    Other Instructions   Important Information About Sugar

## 2022-07-22 ENCOUNTER — Encounter: Payer: Medicare HMO | Admitting: Family Medicine

## 2022-07-25 ENCOUNTER — Telehealth: Payer: Self-pay | Admitting: Podiatry

## 2022-07-25 NOTE — Telephone Encounter (Signed)
Spoke with patient to advise that we are awaiting his shoes to come in and he can pick up from [place]. We will call when they arrive. Patient understood.

## 2022-08-11 ENCOUNTER — Telehealth: Payer: Self-pay | Admitting: Podiatry

## 2022-08-11 NOTE — Telephone Encounter (Signed)
Patient came in [facility] office today inreference to his diabetic shoes. Patient states he was told they would be ready in 2 weeks. Let pt know they are still waiting for order to come in [place] will call when shoes get here.

## 2022-08-13 NOTE — Telephone Encounter (Signed)
Spoke wit pt and informed him that she shoes are on order and we would give him a call when they come in.

## 2022-08-15 ENCOUNTER — Ambulatory Visit (INDEPENDENT_AMBULATORY_CARE_PROVIDER_SITE_OTHER): Payer: Medicare HMO

## 2022-08-15 VITALS — Ht 66.0 in | Wt 173.0 lb

## 2022-08-15 DIAGNOSIS — Z0000 Encounter for general adult medical examination without abnormal findings: Secondary | ICD-10-CM | POA: Diagnosis not present

## 2022-08-15 NOTE — Patient Instructions (Signed)
[name] , Thank you for taking time to come for your Medicare Wellness Visit. I appreciate your ongoing commitment to your health goals. Please review the following plan we discussed and let me know if I can assist you in the future.   Screening recommendations/referrals: Colonoscopy: aged out Recommended yearly ophthalmology/optometry visit for glaucoma screening and checkup Recommended yearly dental visit for hygiene and checkup  Vaccinations: Influenza vaccine: 11/11/21 Pneumococcal vaccine: 11/27/17 Tdap vaccine: 06/13/16 Shingles vaccine: n/d   Covid-19: states had 2 shots, no dates available  Advanced directives: no  Conditions/risks identified: none  Next appointment: Follow up in one year for your annual wellness visit. 08/21/23 @ 11:30 am by phone  Preventive Care 65 Years and Older, Male Preventive care refers to lifestyle choices and visits with your health care provider that can promote health and wellness. What does preventive care include? A yearly physical exam. This is also called an annual well check. Dental exams once or twice a year. Routine eye exams. Ask your health care provider how often you should have your eyes checked. Personal lifestyle choices, including: Daily care of your teeth and gums. Regular physical activity. Eating a healthy diet. Avoiding tobacco and drug use. Limiting alcohol use. Practicing safe sex. Taking low doses of aspirin every day. Taking vitamin and mineral supplements as recommended by your health care provider. What happens during an annual well check? The services and screenings done by your health care provider during your annual well check will depend on your age, overall health, lifestyle risk factors, and family history of disease. Counseling  Your health care provider may ask you questions about your: Alcohol use. Tobacco use. Drug use. Emotional well-being. Home and relationship well-being. Sexual activity. Eating  habits. History of falls. Memory and ability to understand (cognition). Work and work Statistician. Screening  You may have the following tests or measurements: Height, weight, and BMI. Blood pressure. Lipid and cholesterol levels. These may be checked every 5 years, or more frequently if you are over 19 years old. Skin check. Lung cancer screening. You may have this screening every year starting at age 61 if you have a 30-pack-year history of smoking and currently smoke or have quit within the past 15 years. Fecal occult blood test (FOBT) of the stool. You may have this test every year starting at age 42. Flexible sigmoidoscopy or colonoscopy. You may have a sigmoidoscopy every 5 years or a colonoscopy every 10 years starting at age 23. Prostate cancer screening. Recommendations will vary depending on your family history and other risks. Hepatitis C blood test. Hepatitis B blood test. Sexually transmitted disease (STD) testing. Diabetes screening. This is done by checking your blood sugar (glucose) after you have not eaten for a while (fasting). You may have this done every 1-3 years. Abdominal aortic aneurysm (AAA) screening. You may need this if you are a current or former smoker. Osteoporosis. You may be screened starting at age 60 if you are at high risk. Talk with your health care provider about your test results, treatment options, and if necessary, the need for more tests. Vaccines  Your health care provider may recommend certain vaccines, such as: Influenza vaccine. This is recommended every year. Tetanus, diphtheria, and acellular pertussis (Tdap, Td) vaccine. You may need a Td booster every 10 years. Zoster vaccine. You may need this after age 62. Pneumococcal 13-valent conjugate (PCV13) vaccine. One dose is recommended after age 61. Pneumococcal polysaccharide (PPSV23) vaccine. One dose is recommended after age 44.  Talk to your health care provider about which screenings and  vaccines you need and how often you need them. This information is not intended to replace advice given to you by your health care provider. Make sure you discuss any questions you have with your health care provider. Document Released: 12/14/2015 Document Revised: 08/06/2016 Document Reviewed: 09/18/2015 Elsevier Interactive Patient Education  2017 [place] Prevention in the Home Falls can cause injuries. They can happen to people of all ages. There are many things you can do to make your home safe and to help prevent falls. What can I do on the outside of my home? Regularly fix the edges of walkways and driveways and fix any cracks. Remove anything that might make you trip as you walk through a door, such as a raised step or threshold. Trim any bushes or trees on the path to your home. Use bright outdoor lighting. Clear any walking paths of anything that might make someone trip, such as rocks or tools. Regularly check to see if handrails are loose or broken. Make sure that both sides of any steps have handrails. Any raised decks and porches should have guardrails on the edges. Have any leaves, snow, or ice cleared regularly. Use sand or salt on walking paths during winter. Clean up any spills in your garage right away. This includes oil or grease spills. What can I do in the bathroom? Use night lights. Install grab bars by the toilet and in the tub and shower. Do not use towel bars as grab bars. Use non-skid mats or decals in the tub or shower. If you need to sit down in the shower, use a plastic, non-slip stool. Keep the floor dry. Clean up any water that spills on the floor as soon as it happens. Remove soap buildup in the tub or shower regularly. Attach bath mats securely with double-sided non-slip rug tape. Do not have throw rugs and other things on the floor that can make you trip. What can I do in the bedroom? Use night lights. Make sure that you have a light by your  bed that is easy to reach. Do not use any sheets or blankets that are too big for your bed. They should not hang down onto the floor. Have a firm chair that has side arms. You can use this for support while you get dressed. Do not have throw rugs and other things on the floor that can make you trip. What can I do in the kitchen? Clean up any spills right away. Avoid walking on wet floors. Keep items that you use a lot in easy-to-reach places. If you need to reach something above you, use a strong step stool that has a grab bar. Keep electrical cords out of the way. Do not use floor polish or wax that makes floors slippery. If you must use wax, use non-skid floor wax. Do not have throw rugs and other things on the floor that can make you trip. What can I do with my stairs? Do not leave any items on the stairs. Make sure that there are handrails on both sides of the stairs and use them. Fix handrails that are broken or loose. Make sure that handrails are as long as the stairways. Check any carpeting to make sure that it is firmly attached to the stairs. Fix any carpet that is loose or worn. Avoid having throw rugs at the top or bottom of the stairs. If you do have throw  rugs, attach them to the floor with carpet tape. Make sure that you have a light switch at the top of the stairs and the bottom of the stairs. If you do not have them, ask someone to add them for you. What else can I do to help prevent falls? Wear shoes that: Do not have high heels. Have rubber bottoms. Are comfortable and fit you well. Are closed at the toe. Do not wear sandals. If you use a stepladder: Make sure that it is fully opened. Do not climb a closed stepladder. Make sure that both sides of the stepladder are locked into place. Ask someone to hold it for you, if possible. Clearly mark and make sure that you can see: Any grab bars or handrails. First and last steps. Where the edge of each step is. Use tools that  help you move around (mobility aids) if they are needed. These include: Canes. Walkers. Scooters. Crutches. Turn on the lights when you go into a dark area. Replace any light bulbs as soon as they burn out. Set up your furniture so you have a clear path. Avoid moving your furniture around. If any of your floors are uneven, fix them. If there are any pets around you, be aware of where they are. Review your medicines with your doctor. Some medicines can make you feel dizzy. This can increase your chance of falling. Ask your doctor what other things that you can do to help prevent falls. This information is not intended to replace advice given to you by your health care provider. Make sure you discuss any questions you have with your health care provider. Document Released: 09/13/2009 Document Revised: 04/24/2016 Document Reviewed: 12/22/2014 Elsevier Interactive Patient Education  2017 [employer].

## 2022-08-15 NOTE — Progress Notes (Signed)
Virtual Visit via Telephone Note  I connected with  [name] on 08/15/22 at 11:00 AM EDT by telephone and verified that I am speaking with the correct person using two identifiers.  Location: Patient: home Provider: [facility] Persons participating in the virtual visit: [place]   I discussed the limitations, risks, security and privacy concerns of performing an evaluation and management service by telephone and the availability of in person appointments. The patient expressed understanding and agreed to proceed.  Interactive audio and video telecommunications were attempted between this nurse and patient, however failed, due to patient having technical difficulties OR patient did not have access to video capability.  We continued and completed visit with audio only.  Some vital signs may be absent or patient reported.   Dionisio David, LPN  Subjective:   [name] is a 81 y.o. male who presents for Medicare Annual/Subsequent preventive examination.  Review of Systems     Cardiac Risk Factors include: advanced age (>58mn, >>44women);male gender;diabetes mellitus;hypertension     Objective:    There were no vitals filed for this visit. There is no height or weight on file to calculate BMI.     08/15/2022   10:58 AM 12/04/2020   10:34 AM 11/29/2019    1:24 PM 02/07/2019    9:36 AM 01/18/2019    3:51 PM 01/18/2019   11:24 AM 11/26/2018    1:08 PM  Advanced Directives  Does Patient Have a Medical Advance Directive? No No No No No No Yes  Type of ATransport plannerLiving will  Copy of [place] Chart?       No - copy requested  Would patient like information on creating a medical advance directive? No - Patient declined No - Patient declined No - Patient declined No - Patient declined No - Patient declined No - Patient declined     Current Medications (verified) Outpatient Encounter Medications  as of 08/15/2022  Medication Sig   amLODipine (NORVASC) 10 MG tablet Take 1 tablet (10 mg total) by mouth daily.   aspirin 81 MG chewable tablet Chew 81 mg by mouth daily.   Blood Glucose Monitoring Suppl (ONE TOUCH ULTRA 2) w/Device KIT OneTouch Ultra2 Meter kit   Cinnamon Bark POWD Take 1 Scoop by mouth daily with breakfast.    diclofenac Sodium (VOLTAREN) 1 % GEL Apply topically.   glipiZIDE (GLUCOTROL XL) 2.5 MG 24 hr tablet    hydrochlorothiazide (HYDRODIURIL) 25 MG tablet Take 1 tablet (25 mg total) by mouth daily.   HYDROcodone-acetaminophen (NORCO/VICODIN) 5-325 MG tablet    losartan (COZAAR) 50 MG tablet Take 1 tablet by mouth daily.   ONETOUCH ULTRA test strip Use to check blood sugar 1 x daily   telmisartan (MICARDIS) 80 MG tablet Take 1 tablet (80 mg total) by mouth daily.   triamcinolone cream (KENALOG) 0.1 % SMARTSIG:1 Application Topical 2-3 Times Daily   naproxen sodium (ALEVE) 220 MG tablet Take 220 mg by mouth 2 (two) times daily as needed. (Patient not taking: Reported on 07/18/2022)   No facility-administered encounter medications on file as of 08/15/2022.    Allergies (verified) Lisinopril   History: Past Medical History:  Diagnosis Date   Diabetes mellitus without complication ([place]    type 2. 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 N/A 02/11/2019   Procedure: HOLEP-LASER ENUCLEATION OF THE PROSTATE WITH MORCELLATION;  Surgeon: Billey Co, MD;  Location: [facility] ORS;  Service: Urology;  Laterality: N/A;   LIPOMA EXCISION  01/28/2011   Family History   Problem Relation Age of Onset   Alzheimer's disease Sister    Heart attack Brother    Lung cancer Son    Social History   Socioeconomic History   Marital status: Widowed    Spouse name: [name]   Number of children: 5   Years of education: Not on file   Highest education level: 10th grade  Occupational History   Occupation: worked at [employer] for 46 yrs    Comment: retired  Tobacco Use   Smoking status: Former    Packs/day: 1.00    Types: Cigarettes    Quit date: 1956    Years since quitting: 91.7   Smokeless tobacco: Former    Types: Chew    Quit date: 2005   Tobacco comments:    81 years old for 1 year  Vaping Use   Vaping Use: Never used  Substance and Sexual Activity   Alcohol use: No    Alcohol/week: 0.0 standard drinks of alcohol   Drug use: No   Sexual activity: Not on file  Other Topics Concern   Not on file  Social History Narrative   Not on file   Social Determinants of Health   Financial Resource Strain: [place]  (08/15/2022)   Overall Financial Resource Strain (CARDIA)    Difficulty of Paying Living Expenses: Not hard at all  Food Insecurity: No Food Insecurity (08/15/2022)   Hunger Vital Sign    Worried About Running Out of Food in the Last Year: Never true    [place] in the Last Year: Never true  Transportation Needs: No Transportation Needs (08/15/2022)   PRAPARE - Hydrologist (Medical): No    Lack of Transportation (Non-Medical): No  Physical Activity: Sufficiently Active (08/15/2022)   Exercise Vital Sign    Days of Exercise per Week: 4 days    Minutes of Exercise per Session: 40 min  Stress: No Stress Concern Present (08/15/2022)   [place]    Feeling of Stress : Not at all  Social Connections: Moderately Isolated (08/15/2022)   Social Connection and Isolation Panel [NHANES]    Frequency of Communication with Friends and Family:  More than three times a week    Frequency of Social Gatherings with Friends and Family: More than three times a week    Attends Religious Services: More than 4 times per year    Active Member of [employer] or Organizations: No    Attends Archivist Meetings: Never    Marital Status: Widowed    Tobacco Counseling Counseling given: Not Answered Tobacco comments: 81 years old for 1 year   Clinical Intake:  Pre-visit preparation completed: Yes  Pain : No/denies pain     Nutritional Risks: None Diabetes: No  How often do you need to have someone  help you when you read instructions, pamphlets, or other written materials from your doctor or pharmacy?: 1 - Never  Diabetic?yes Nutrition Risk Assessment:  Has the patient had any N/V/D within the last 2 months?  No  Does the patient have any non-healing wounds?  No  Has the patient had any unintentional weight loss or weight gain?  No   Diabetes:  Is the patient diabetic?  Yes  If diabetic, was a CBG obtained today?  No  Did the patient bring in their glucometer from home?  No  How often do you monitor your CBG's? Every day.   Financial Strains and Diabetes Management:  Are you having any financial strains with the device, your supplies or your medication? No .  Does the patient want to be seen by Chronic Care Management for management of their diabetes?  No  Would the patient like to be referred to a Nutritionist or for Diabetic Management?  No   Diabetic Exams:  Diabetic Eye Exam: Completed 02/12/22. Pt has been advised about the importance in completing this exam.  Diabetic Foot Exam: Completed 04/16/22. Pt has been advised about the importance in completing this exam.   Interpreter Needed?: No  Information entered by :: Kirke Shaggy, LPN   Activities of Daily Living    08/15/2022   10:59 AM  In your present state of health, do you have any difficulty performing the following activities:  Hearing? 0  Vision? 0   Difficulty concentrating or making decisions? 0  Walking or climbing stairs? 0  Dressing or bathing? 0  Doing errands, shopping? 0  Preparing Food and eating ? N  Using the Toilet? N  In the past six months, have you accidently leaked urine? N  Do you have problems with loss of bowel control? N  Managing your Medications? N  Managing your Finances? N  Housekeeping or managing your Housekeeping? N    Patient Care Team: Olin Hauser, DO as PCP - General (Family Medicine) Kate Sable, MD as PCP - Cardiology (Cardiology) Odette Fraction as Consulting Physician (Optometry)  Indicate any recent Medical Services you may have received from other than [facility] providers in the past year (date may be approximate).     Assessment:   This is a routine wellness examination for [name].  Hearing/Vision screen Hearing Screening - Comments:: No aids Vision Screening - Comments:: Readers- Dr.Thurmond    Dietary issues and exercise activities discussed: Current Exercise Habits: Home exercise routine, Type of exercise: walking, Time (Minutes): 40, Frequency (Times/Week): 4, Weekly Exercise (Minutes/Week): 160, Intensity: Mild   Goals Addressed             This Visit's Progress    DIET - EAT MORE FRUITS AND VEGETABLES         Depression Screen    08/15/2022   10:56 AM 04/30/2021   11:04 AM 12/04/2020   10:31 AM 10/16/2020    3:41 PM 11/29/2019    1:25 PM 11/29/2019    1:21 PM 11/26/2018    1:14 PM  PHQ 2/9 Scores  PHQ - 2 Score 0 0 0 0 0 0 0  PHQ- 9 Score 0 0     0    Fall Risk    08/15/2022   10:59 AM 04/30/2021   11:03 AM 12/04/2020   10:35 AM 10/16/2020    3:41 PM 11/29/2019    1:25 PM  Fall Risk   Falls in the past year? 0 0 0 0 0  Number falls in past yr: 0 0 0 0 0  Injury with Fall? 0 0 0 0 0  Risk for fall due to : No Fall Risks Impaired balance/gait;Impaired mobility;Orthopedic patient     Follow up Falls prevention discussed Falls evaluation  completed       FALL RISK PREVENTION PERTAINING TO THE HOME:  Any stairs in or around the home? Yes  If so, are there any without handrails? No  Home free of loose throw rugs in walkways, pet beds, electrical cords, etc? Yes  Adequate lighting in your home to reduce risk of falls? Yes   ASSISTIVE DEVICES UTILIZED TO PREVENT FALLS:  Life alert? No  Use of a cane, walker or w/c? Yes  Grab bars in the bathroom? No  Shower chair or bench in shower? Yes  Elevated toilet seat or a handicapped toilet? No    Cognitive Function:        08/15/2022   11:01 AM 11/29/2019    1:30 PM 11/14/2016    3:08 PM  6CIT Screen  What Year? 0 points 0 points 0 points  What month? 0 points 0 points 0 points  What time? 0 points 0 points 0 points  Count back from 20 0 points 0 points 0 points  Months in reverse 0 points 0 points 0 points  Repeat phrase 0 points 0 points 4 points  Total Score 0 points 0 points 4 points    Immunizations Immunization History  Administered Date(s) Administered   Fluad Quad(high Dose 65+) 08/10/2019, 10/16/2020, 11/11/2021   Influenza, High Dose Seasonal PF 11/14/2016, 09/17/2018   Influenza, Seasonal, Injecte, Preservative Fre 09/20/2012   Influenza-Unspecified 09/17/2017   Pneumococcal Conjugate-13 11/27/2017   Tdap 06/13/2016    TDAP status: Up to date  Flu Vaccine status: Up to date  Pneumococcal vaccine status: Up to date  Covid-19 vaccine status: Completed vaccines- states had 2 shots  Qualifies for Shingles Vaccine? Yes   Zostavax completed No   Shingrix Completed?: No.    Education has been provided regarding the importance of this vaccine. Patient has been advised to call insurance company to determine out of pocket expense if they have not yet received this vaccine. Advised may also receive vaccine at local pharmacy or Health Dept. Verbalized acceptance and understanding.  Screening Tests Health Maintenance  Topic Date Due   COVID-19 Vaccine  (1) Never done   Zoster Vaccines- Shingrix (1 of 2) Never done   Diabetic kidney evaluation - Urine ACR  01/05/2021   Diabetic kidney evaluation - GFR measurement  05/06/2022   HEMOGLOBIN A1C  05/12/2022   INFLUENZA VACCINE  07/01/2022   Pneumonia Vaccine 96+ Years old (2 - PPSV23 or PCV20) 04/17/2023 (Originally 11/27/2018)   OPHTHALMOLOGY EXAM  02/13/2023   FOOT EXAM  04/17/2023   TETANUS/TDAP  06/13/2026   HPV VACCINES  Aged Out    Health Maintenance  Health Maintenance Due  Topic Date Due   COVID-19 Vaccine (1) Never done   Zoster Vaccines- Shingrix (1 of 2) Never done   Diabetic kidney evaluation - Urine ACR  01/05/2021   Diabetic kidney evaluation - GFR measurement  05/06/2022   HEMOGLOBIN A1C  05/12/2022   INFLUENZA VACCINE  07/01/2022    Colorectal cancer screening: No longer required.   Lung Cancer Screening: (Low Dose CT Chest recommended if Age 34-80 years, 30 pack-year currently smoking OR have quit w/in 15years.) does not qualify.   Additional Screening:  Hepatitis C Screening: does not qualify;  Completed no  Vision Screening: Recommended annual ophthalmology exams for early detection of glaucoma and other disorders of the eye. Is the patient up to date with their annual eye exam?  Yes  Who is the provider or what is the name of the office in which the patient attends annual eye exams? Dr. Gwynn Burly  If pt is not established with a provider, would they like to be referred to a provider to establish care? No .   Dental Screening: Recommended annual dental exams for proper oral hygiene  Community Resource Referral / Chronic Care Management: CRR required this visit?  No   CCM required this visit?  No      Plan:     I have personally reviewed and noted the following in the patient's chart:   Medical and social history Use of alcohol, tobacco or illicit drugs  Current medications and supplements including opioid prescriptions. Patient is not currently  taking opioid prescriptions. Functional ability and status Nutritional status Physical activity Advanced directives List of other physicians Hospitalizations, surgeries, and ER visits in previous 12 months Vitals Screenings to include cognitive, depression, and falls Referrals and appointments  In addition, I have reviewed and discussed with patient certain preventive protocols, quality metrics, and best practice recommendations. A written personalized care plan for preventive services as well as general preventive health recommendations were provided to patient.     Dionisio David, LPN   6/31/4970   Nurse Notes: none

## 2022-09-18 ENCOUNTER — Ambulatory Visit (INDEPENDENT_AMBULATORY_CARE_PROVIDER_SITE_OTHER): Payer: Medicare HMO | Admitting: Podiatry

## 2022-09-18 DIAGNOSIS — Q667 Congenital pes cavus, unspecified foot: Secondary | ICD-10-CM

## 2022-09-18 DIAGNOSIS — E1100 Type 2 diabetes mellitus with hyperosmolarity without nonketotic hyperglycemic-hyperosmolar coma (NKHHC): Secondary | ICD-10-CM

## 2022-09-18 NOTE — Progress Notes (Signed)
Patient was unable to be fitted for diabetic shoes he needs a size 11.  Shoe size that was delivered to size 8.  He would like to return and redo the correct size.

## 2022-11-11 ENCOUNTER — Telehealth: Payer: Self-pay | Admitting: Podiatry

## 2022-11-11 NOTE — Telephone Encounter (Signed)
Left message for patient to call back to set up time to pick up diabetic shoes,  shoes are currently in [place], will be sent to [place] .

## 2022-11-20 NOTE — Telephone Encounter (Signed)
Shoes shipped to [facility] 11/20/22 -these are an exchange from October when shoes were brought back due to not fitting well.

## 2022-12-02 ENCOUNTER — Ambulatory Visit (INDEPENDENT_AMBULATORY_CARE_PROVIDER_SITE_OTHER): Payer: Medicare HMO | Admitting: Podiatry

## 2022-12-02 DIAGNOSIS — Q6672 Congenital pes cavus, left foot: Secondary | ICD-10-CM

## 2022-12-02 DIAGNOSIS — Q667 Congenital pes cavus, unspecified foot: Secondary | ICD-10-CM

## 2022-12-02 DIAGNOSIS — E1100 Type 2 diabetes mellitus with hyperosmolarity without nonketotic hyperglycemic-hyperosmolar coma (NKHHC): Secondary | ICD-10-CM | POA: Diagnosis not present

## 2022-12-02 DIAGNOSIS — Q6671 Congenital pes cavus, right foot: Secondary | ICD-10-CM | POA: Diagnosis not present

## 2022-12-02 NOTE — Patient Instructions (Signed)

## 2022-12-02 NOTE — Progress Notes (Signed)
Patient presents to pick up Diabetic shoe and inserts. The patient states that the shoe fits a little tight, Wearing and returning instructions were given. He was advised to return as needed for adjustments .

## 2022-12-08 ENCOUNTER — Telehealth: Payer: Self-pay | Admitting: Podiatry

## 2022-12-08 NOTE — Telephone Encounter (Signed)
Patient returned shoes to [place] office , he wants a bigger size .

## 2022-12-15 NOTE — Telephone Encounter (Signed)
Shoe ordered in size 10W, for further fitting concerns pt will need to be re-measured.

## 2023-01-09 ENCOUNTER — Encounter: Payer: Self-pay | Admitting: Podiatry

## 2023-01-09 ENCOUNTER — Ambulatory Visit (INDEPENDENT_AMBULATORY_CARE_PROVIDER_SITE_OTHER): Payer: Medicare HMO | Admitting: Podiatry

## 2023-01-09 DIAGNOSIS — E1100 Type 2 diabetes mellitus with hyperosmolarity without nonketotic hyperglycemic-hyperosmolar coma (NKHHC): Secondary | ICD-10-CM

## 2023-01-09 DIAGNOSIS — Q667 Congenital pes cavus, unspecified foot: Secondary | ICD-10-CM

## 2023-01-09 NOTE — Progress Notes (Signed)
Diabetic shoes were dispensed.  Fitting well no complication noted.

## 2023-03-02 ENCOUNTER — Ambulatory Visit
Admission: RE | Admit: 2023-03-02 | Discharge: 2023-03-02 | Disposition: A | Discharge status: Home / Self Care | Payer: Medicare HMO | Source: Ambulatory Visit | Attending: Infectious Diseases | Admitting: Infectious Diseases

## 2023-03-02 ENCOUNTER — Other Ambulatory Visit: Payer: Medicare HMO

## 2023-03-02 ENCOUNTER — Other Ambulatory Visit: Payer: Self-pay | Admitting: Infectious Diseases

## 2023-03-02 DIAGNOSIS — M25512 Pain in left shoulder: Secondary | ICD-10-CM

## 2023-03-02 DIAGNOSIS — M25511 Pain in right shoulder: Secondary | ICD-10-CM

## 2023-04-21 ENCOUNTER — Encounter: Payer: Medicare HMO | Admitting: Family Medicine

## 2023-07-01 ENCOUNTER — Telehealth: Payer: Self-pay

## 2023-07-01 NOTE — Telephone Encounter (Signed)
LVM for patient to call back [phone], or to call PCP office to schedule follow up apt. AS, CMA  

## 2023-07-17 ENCOUNTER — Other Ambulatory Visit: Payer: Self-pay

## 2023-07-17 ENCOUNTER — Ambulatory Visit: Payer: Medicare HMO | Attending: Cardiology

## 2023-07-17 DIAGNOSIS — I350 Nonrheumatic aortic (valve) stenosis: Secondary | ICD-10-CM

## 2023-08-18 ENCOUNTER — Ambulatory Visit: Payer: Medicare HMO | Attending: Family Medicine

## 2023-09-16 ENCOUNTER — Ambulatory Visit: Payer: Medicare HMO | Attending: Cardiology

## 2023-09-16 DIAGNOSIS — I350 Nonrheumatic aortic (valve) stenosis: Secondary | ICD-10-CM | POA: Diagnosis not present

## 2023-09-16 LAB — ECHOCARDIOGRAM COMPLETE
AR max vel: 2.31 cm2
AV Area VTI: 2.37 cm2
AV Area mean vel: 2.32 cm2
AV Mean grad: 8 mm[Hg]
AV Peak grad: 15.8 mm[Hg]
Ao pk vel: 1.99 m/s
Area-P 1/2: 3.21 cm2
S' Lateral: 2 cm

## 2024-01-14 ENCOUNTER — Ambulatory Visit: Payer: Medicare HMO | Attending: Cardiology | Admitting: Cardiology

## 2024-01-14 ENCOUNTER — Encounter: Payer: Self-pay | Admitting: Cardiology

## 2024-01-14 VITALS — BP 154/70 | HR 85 | Ht 66.0 in | Wt 172.4 lb

## 2024-01-14 DIAGNOSIS — I350 Nonrheumatic aortic (valve) stenosis: Secondary | ICD-10-CM | POA: Diagnosis not present

## 2024-01-14 DIAGNOSIS — I10 Essential (primary) hypertension: Secondary | ICD-10-CM

## 2024-01-14 NOTE — Progress Notes (Signed)
Cardiology Office Note:    Date:  01/14/2024   ID:  John ALCALA, DOB Dec 26, 1940, MRN [id number]  PCP:  [name]   [facility]  Cardiologist:  Debbe Odea, MD  Advanced Practice Provider:  No care team member to display Electrophysiologist:  None       Referring MD: Saralyn Pilar *   Chief Complaint  Patient presents with   Follow-up    Patient denies new or acute cardiac problems/concerns today.      History of Present Illness:    [name] is a 83 y.o. male with a hx of hypertension, mild aortic stenosis who presents for follow-up.   Feels well, denies chest pain or shortness of breath, blood pressures at home are typically well-controlled with systolics in the 130s.  Denies dizziness, presyncope or syncope.  Repeat echocardiogram was obtained 09/2023 to evaluate aortic valve disease.  He feels well, no concerns today.  Prior notes Echocardiogram 03/2021 normal systolic function, EF 55 to 60% moderate calcification, mild aortic valve stenosis.   Past Medical History:  Diagnosis Date   Diabetes mellitus without complication (HCC)    type 2. has not taken meds in two years    Heart murmur    has had for years   Hypertension    Stone, bladder     Past Surgical History:  Procedure Laterality Date   CATARACT EXTRACTION, BILATERAL     CHOLECYSTECTOMY  04/1995   CYSTOSCOPY WITH LITHOLAPAXY  02/11/2019   Procedure: CYSTOSCOPY WITH LITHOLAPAXY;  Surgeon: Sondra Come, MD;  Location: [facility] ORS;  Service: Urology;;   EYE SURGERY     HOLEP-LASER ENUCLEATION OF THE PROSTATE WITH MORCELLATION N/A 02/11/2019   Procedure: HOLEP-LASER ENUCLEATION OF THE PROSTATE WITH MORCELLATION;  Surgeon: Sondra Come, MD;  Location: [facility] ORS;  Service: Urology;  Laterality: N/A;   LIPOMA EXCISION  01/28/2011    Current Medications: Current Meds  Medication Sig   amLODipine (NORVASC) 10 MG tablet Take 1 tablet (10 mg  total) by mouth daily.   aspirin 81 MG chewable tablet Chew 81 mg by mouth daily.   Blood Glucose Monitoring Suppl (ONE TOUCH ULTRA 2) w/Device KIT OneTouch Ultra2 Meter kit   Cinnamon Bark POWD Take 1 Scoop by mouth daily with breakfast.    diclofenac Sodium (VOLTAREN) 1 % GEL Apply topically.   glipiZIDE (GLUCOTROL XL) 2.5 MG 24 hr tablet    hydrochlorothiazide (HYDRODIURIL) 25 MG tablet Take 1 tablet (25 mg total) by mouth daily.   losartan (COZAAR) 50 MG tablet Take 1 tablet by mouth daily.   naproxen sodium (ALEVE) 220 MG tablet Take 220 mg by mouth 2 (two) times daily as needed.   ONETOUCH ULTRA test strip Use to check blood sugar 1 x daily   telmisartan (MICARDIS) 80 MG tablet Take 1 tablet (80 mg total) by mouth daily.   triamcinolone cream (KENALOG) 0.1 % SMARTSIG:1 Application Topical 2-3 Times Daily     Allergies:   Lisinopril   Social History   Socioeconomic History   Marital status: Widowed    Spouse name: [name]   Number of children: 5   Years of education: Not on file   Highest education level: 10th grade  Occupational History   Occupation: worked at [employer] for 46 yrs    Comment: retired  Tobacco Use   Smoking status: Former    Current packs/day: 0.00    Types: Cigarettes  Quit date: 36    Years since quitting: 69.1   Smokeless tobacco: Former    Types: Chew    Quit date: 2005   Tobacco comments:    83 years old for 1 year  Vaping Use   Vaping status: Never Used  Substance and Sexual Activity   Alcohol use: No    Alcohol/week: 0.0 standard drinks of alcohol   Drug use: No   Sexual activity: Not on file  Other Topics Concern   Not on file  Social History Narrative   Not on file   Social Drivers of Health   Financial Resource Strain: Low Risk  (08/15/2022)   Overall Financial Resource Strain (CARDIA)    Difficulty of Paying Living Expenses: Not hard at all  Food Insecurity: No Food Insecurity (08/15/2022)   Hunger Vital Sign    Worried  About Running Out of Food in the Last Year: Never true    Ran Out of Food in the Last Year: Never true  Transportation Needs: No Transportation Needs (08/15/2022)   PRAPARE - Administrator, Civil Service (Medical): No    Lack of Transportation (Non-Medical): No  Physical Activity: Sufficiently Active (08/15/2022)   Exercise Vital Sign    Days of Exercise per Week: 4 days    Minutes of Exercise per Session: 40 min  Stress: No Stress Concern Present (08/15/2022)   [employer] of Occupational Health - Occupational Stress Questionnaire    Feeling of Stress : Not at all  Social Connections: Moderately Isolated (08/15/2022)   Social Connection and Isolation Panel [NHANES]    Frequency of Communication with Friends and Family: More than three times a week    Frequency of Social Gatherings with Friends and Family: More than three times a week    Attends Religious Services: More than 4 times per year    Active Member of [employer] or Organizations: No    Attends Banker Meetings: Never    Marital Status: Widowed     Family History: The patient's family history includes Alzheimer's disease in his sister; Heart attack in his brother; Lung cancer in his son.  ROS:   Please see the history of present illness.     All other systems reviewed and are negative.  EKGs/Labs/Other Studies Reviewed:    The following studies were reviewed today:   EKG:  EKG is  ordered today.  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  Recent Lipid Panel    Component Value Date/Time   CHOL 114 05/06/2021 0947   TRIG 56 05/06/2021 0947   HDL 36 (L) 05/06/2021 0947   CHOLHDL 3.2 05/06/2021 0947   LDLCALC 65 05/06/2021 0947     Risk Assessment/Calculations:      Physical Exam:    VS:  BP (!) 154/70 (BP Location: Left Arm, Patient Position: Sitting, Cuff Size: Normal)   Pulse  85   Ht 5\' 6"  (1.676 m)   Wt 172 lb 6.4 oz (78.2 kg)   SpO2 98%   BMI 27.83 kg/m     Wt Readings from Last 3 Encounters:  01/14/24 172 lb 6.4 oz (78.2 kg)  08/15/22 173 lb (78.5 kg)  07/18/22 173 lb 3.2 oz (78.6 kg)     GEN:  Well nourished, well developed in no acute distress HEENT: Normal NECK: No JVD; No carotid bruits CARDIAC: RRR, 2/6 systolic murmur RESPIRATORY:  Clear to auscultation without rales, wheezing or rhonchi  ABDOMEN: Soft, non-tender, non-distended MUSCULOSKELETAL:  No edema; No deformity  SKIN: Warm and dry NEUROLOGIC:  Alert and oriented x 3 PSYCHIATRIC:  Normal affect   ASSESSMENT:    1. Primary hypertension   2. Aortic valve stenosis, etiology of cardiac valve disease unspecified    PLAN:    In order of problems listed above:  Hypertension, BP elevated today,controlled at home with systolics in the 130s, reasonable for age.  Continue HCTZ 25 mg daily,telmisartan 80 mg daily and amlodipine 10 mg daily.  Advised to check BP at home and keep log. Mild aortic valve stenosis.  Repeat echo 09/2023 showing mild aortic stenosis, EF 60 to 65%.  No significant change from prior.  Plan serial monitoring with repeat echo in about 2 to 3 years from last.  Follow-up in yearly.    Medication Adjustments/Labs and Tests Ordered: Current medicines are reviewed at length with the patient today.  Concerns regarding medicines are outlined above.  Orders Placed This Encounter  Procedures   EKG 12-Lead    No orders of the defined types were placed in this encounter.    Patient Instructions  Medication Instructions:   Your physician recommends that you continue on your current medications as directed. Please refer to the Current Medication list given to you today.  *If you need a refill on your cardiac medications before your next appointment, please call your pharmacy*   Lab Work:  None Ordered  If you have labs (blood work) drawn today and your tests are  completely normal, you will receive your results only by: MyChart Message (if you have MyChart) OR A paper copy in the mail If you have any lab test that is abnormal or we need to change your treatment, we will call you to review the results.   Testing/Procedures:  None Ordered   Follow-Up: At [facility], you and your health needs are our priority.  As part of our continuing mission to provide you with exceptional heart care, we have created designated Provider Care Teams.  These Care Teams include your primary Cardiologist (physician) and Advanced Practice Providers (APPs -  Physician Assistants and Nurse Practitioners) who all work together to provide you with the care you need, when you need it.  We recommend signing up for the patient portal called "MyChart".  Sign up information is provided on this After Visit Summary.  MyChart is used to connect with patients for Virtual Visits (Telemedicine).  Patients are able to view lab/test results, encounter notes, upcoming appointments, etc.  Non-urgent messages can be sent to your provider as well.   To learn more about what you can do with MyChart, go to [email or web address].    Your next appointment:   12 month(s)  Provider:   You may see Debbe Odea, MD or one of the following Advanced Practice Providers on your designated Care Team:   Nicolasa Ducking, NP Eula Listen, PA-C Cadence Fransico Michael, PA-C Charlsie Quest, NP Carlos Levering, NP    Signed, Debbe Odea, MD  01/14/2024 9:35 AM    [facility]

## 2024-01-14 NOTE — Patient Instructions (Addendum)
Medication Instructions:   Your physician recommends that you continue on your current medications as directed. Please refer to the Current Medication list given to you today.  *If you need a refill on your cardiac medications before your next appointment, please call your pharmacy*   Lab Work:  None Ordered  If you have labs (blood work) drawn today and your tests are completely normal, you will receive your results only by: MyChart Message (if you have MyChart) OR A paper copy in the mail If you have any lab test that is abnormal or we need to change your treatment, we will call you to review the results.   Testing/Procedures:  None Ordered    Follow-Up: At [facility], you and your health needs are our priority.  As part of our continuing mission to provide you with exceptional heart care, we have created designated Provider Care Teams.  These Care Teams include your primary Cardiologist (physician) and Advanced Practice Providers (APPs -  Physician Assistants and Nurse Practitioners) who all work together to provide you with the care you need, when you need it.  We recommend signing up for the patient portal called "MyChart".  Sign up information is provided on this After Visit Summary.  MyChart is used to connect with patients for Virtual Visits (Telemedicine).  Patients are able to view lab/test results, encounter notes, upcoming appointments, etc.  Non-urgent messages can be sent to your provider as well.   To learn more about what you can do with MyChart, go to [email or web address].    Your next appointment:   12 month(s)  Provider:   You may see Debbe Odea, MD or one of the following Advanced Practice Providers on your designated Care Team:   Nicolasa Ducking, NP Eula Listen, PA-C Cadence Fransico Michael, PA-C Charlsie Quest, NP Carlos Levering, NP

## 2024-02-15 ENCOUNTER — Ambulatory Visit: Payer: Self-pay | Admitting: Family Medicine

## 2024-02-15 NOTE — Telephone Encounter (Signed)
  Chief Complaint: Cough Symptoms: thick white sputum with cough Frequency: unknown time per daughter report Pertinent Negatives: Patient denies fever Disposition: [] ED /[] Urgent Care (no appt availability in office) / [x] Appointment(In office/virtual)/ []  [facility]/ [] Home Care/ [] Refused Recommended Disposition /[] [place] [facility]/ []  Follow-up with PCP Additional Notes: patient's daughter called stating patient has developed productive cough with white sputum. No SOB or CP. Daughter states patient is coughing quite a lot. Per protocol, recommendation is for an appointment. Appointment set up for 02/16/2024 at 8:20 AM. Patient's daughter verbalized understanding of plan and all questions answered.    Copied from CRM [id number]. Topic: Clinical - Red Word Triage >> Feb 15, 2024  3:17 PM Albin Felling L wrote: Red Word that prompted transfer to Nurse Triage: Cough, worsening Reason for Disposition  [1] Continuous (nonstop) coughing interferes with work or school AND [2] no improvement using cough treatment per Care Advice  Answer Assessment - Initial Assessment Questions 1. ONSET: "When did the cough begin?"      Increased cough-Hx of COPD 2. SEVERITY: "How bad is the cough today?"      Deep cough 3. SPUTUM: "Describe the color of your sputum" (none, dry cough; clear, white, yellow, green)     Thick white sputum 4. HEMOPTYSIS: "Are you coughing up any blood?" If so ask: "How much?" (flecks, streaks, tablespoons, etc.)     no 5. DIFFICULTY BREATHING: "Are you having difficulty breathing?" If Yes, ask: "How bad is it?" (e.g., mild, moderate, severe)    - MILD: No SOB at rest, mild SOB with walking, speaks normally in sentences, can lie down, no retractions, pulse < 100.    - MODERATE: SOB at rest, SOB with minimal exertion and prefers to sit, cannot lie down flat, speaks in phrases, mild retractions, audible wheezing, pulse 100-120.    - SEVERE: Very SOB at rest, speaks in single  words, struggling to breathe, sitting hunched forward, retractions, pulse > 120      Unsure-daughter doesn't think so 6. FEVER: "Do you have a fever?" If Yes, ask: "What is your temperature, how was it measured, and when did it start?"     no 7. CARDIAC HISTORY: "Do you have any history of heart disease?" (e.g., heart attack, congestive heart failure)      no 8. LUNG HISTORY: "Do you have any history of lung disease?"  (e.g., pulmonary embolus, asthma, emphysema)     no 9. PE RISK FACTORS: "Do you have a history of blood clots?" (or: recent major surgery, recent prolonged travel, bedridden)     no 10. OTHER SYMPTOMS: "Do you have any other symptoms?" (e.g., runny nose, wheezing, chest pain)       No 12. TRAVEL: "Have you traveled out of the country in the last month?" (e.g., travel history, exposures)       no  Protocols used: Cough - Acute Productive-A-AH

## 2024-02-16 ENCOUNTER — Ambulatory Visit
Admission: RE | Admit: 2024-02-16 | Discharge: 2024-02-16 | Disposition: A | Discharge status: Home / Self Care | Attending: Internal Medicine | Admitting: Internal Medicine

## 2024-02-16 ENCOUNTER — Encounter: Payer: Self-pay | Admitting: Internal Medicine

## 2024-02-16 ENCOUNTER — Ambulatory Visit (INDEPENDENT_AMBULATORY_CARE_PROVIDER_SITE_OTHER): Admitting: Internal Medicine

## 2024-02-16 ENCOUNTER — Ambulatory Visit
Admission: RE | Admit: 2024-02-16 | Discharge: 2024-02-16 | Disposition: A | Discharge status: Home / Self Care | Source: Ambulatory Visit

## 2024-02-16 VITALS — BP 148/80 | HR 82 | Ht 66.0 in | Wt 170.8 lb

## 2024-02-16 DIAGNOSIS — J4520 Mild intermittent asthma, uncomplicated: Secondary | ICD-10-CM

## 2024-02-16 DIAGNOSIS — L89326 Pressure-induced deep tissue damage of left buttock: Secondary | ICD-10-CM | POA: Diagnosis not present

## 2024-02-16 DIAGNOSIS — R058 Other specified cough: Secondary | ICD-10-CM

## 2024-02-16 DIAGNOSIS — J301 Allergic rhinitis due to pollen: Secondary | ICD-10-CM | POA: Diagnosis not present

## 2024-02-16 MED ORDER — FEXOFENADINE HCL 180 MG PO TABS: 180.0000 mg | ORAL_TABLET | Freq: Every day | ORAL | 0 refills | Status: AC

## 2024-02-16 NOTE — Patient Instructions (Signed)
 Asthma, Adult  Asthma is a long-term (chronic) condition that causes recurrent episodes in which the lower airways in the lungs become tight and narrow. The narrowing is caused by inflammation and tightening of the smooth muscle around the lower airways. Asthma episodes, also called asthma attacks or asthma flares, may cause coughing, making high-pitched whistling sounds when you breathe, most often when you breathe out (wheezing), shortness of breath, and chest pain. The airways may produce extra mucus caused by the inflammation and irritation. During an attack, it can be difficult to breathe. Asthma attacks can range from minor to life-threatening. Asthma cannot be cured, but medicines and lifestyle changes can help control it and treat acute attacks. It is important to keep your asthma well controlled so the condition does not interfere with your daily life. What are the causes? This condition is believed to be caused by inherited (genetic) and environmental factors, but its exact cause is not known. What can trigger an asthma attack? Many things can bring on an asthma attack or make symptoms worse. These triggers are different for every person. Common triggers include: Allergens and irritants like mold, dust, pet dander, cockroaches, pollen, air pollution, and chemical odors. Cigarette smoke. Weather changes and cold air. Stress and strong emotional responses such as crying or laughing hard. Certain medications such as aspirin or beta blockers. Infections and inflammatory conditions, such as the flu, a cold, pneumonia, or inflammation of the nasal membranes (rhinitis). Gastroesophageal reflux disease (GERD). What are the signs or symptoms? Symptoms may occur right after exposure to an asthma trigger or hours later and can vary by person. Common signs and symptoms include: Wheezing. Trouble breathing (shortness of breath). Excessive nighttime or early morning coughing. Chest  tightness. Tiredness (fatigue) with minimal activity. Difficulty talking in complete sentences. Poor exercise tolerance. How is this diagnosed? This condition is diagnosed based on: A physical exam and your medical history. Tests, which may include: Lung function studies to evaluate the flow of air in your lungs. Allergy tests. Imaging tests, such as X-rays. How is this treated? There is no cure, but symptoms can be controlled with proper treatment. Treatment usually involves: Identifying and avoiding your asthma triggers. Inhaled medicines. Two types are commonly used to treat asthma, depending on severity: Controller medicines. These help prevent asthma symptoms from occurring. They are taken every day. Fast-acting reliever or rescue medicines. These quickly relieve asthma symptoms. They are used as needed and provide short-term relief. Using other medicines, such as: Allergy medicines, such as antihistamines, if your asthma attacks are triggered by allergens. Immune medicines (immunomodulators). These are medicines that help control the immune system. Using supplemental oxygen. This is only needed during a severe episode. Creating an asthma action plan. An asthma action plan is a written plan for managing and treating your asthma attacks. This plan includes: A list of your asthma triggers and how to avoid them. Information about when medicines should be taken and when their dosage should be changed. Instructions about using a device called a peak flow meter. A peak flow meter measures how well the lungs are working and the severity of your asthma. It helps you monitor your condition. Follow these instructions at home: Take over-the-counter and prescription medicines only as told by your health care provider. Stay up to date on all vaccinations as recommended by your healthcare provider, including vaccines for the flu and pneumonia. Use a peak flow meter and keep track of your peak flow  readings. Understand and use your asthma  action plan to address any asthma flares. Do not smoke or allow anyone to smoke in your home. Contact a health care provider if: You have wheezing, shortness of breath, or a cough that is not responding to medicines. Your medicines are causing side effects, such as a rash, itching, swelling, or trouble breathing. You need to use a reliever medicine more than 2-3 times a week. Your peak flow reading is still at 50-79% of your personal best after following your action plan for 1 hour. You have a fever and shortness of breath. Get help right away if: You are getting worse and do not respond to treatment during an asthma attack. You are short of breath when at rest or when doing very little physical activity. You have difficulty eating, drinking, or talking. You have chest pain or tightness. You develop a fast heartbeat or palpitations. You have a bluish color to your lips or fingernails. You are light-headed or dizzy, or you faint. Your peak flow reading is less than 50% of your personal best. You feel too tired to breathe normally. These symptoms may be an emergency. Get help right away. Call 911. Do not wait to see if the symptoms will go away. Do not drive yourself to the hospital. Summary Asthma is a long-term (chronic) condition that causes recurrent episodes in which the airways become tight and narrow. Asthma episodes, also called asthma attacks or asthma flares, can cause coughing, wheezing, shortness of breath, and chest pain. Asthma cannot be cured, but medicines and lifestyle changes can help keep it well controlled and prevent asthma flares. Make sure you understand how to avoid triggers and how and when to use your medicines. Asthma attacks can range from minor to life-threatening. Get help right away if you have an asthma attack and do not respond to treatment with your usual rescue medicines. This information is not intended to replace  advice given to you by your health care provider. Make sure you discuss any questions you have with your health care provider. Document Revised: 09/04/2021 Document Reviewed: 08/26/2021 Elsevier Patient Education  2024 [employer].

## 2024-02-16 NOTE — Progress Notes (Addendum)
 Subjective:    Patient ID: [name], male    DOB: 11-May-1941, 83 y.o.   MRN: [id number]  HPI  Discussed the use of AI scribe software for clinical note transcription with the patient, who gave verbal consent to proceed.   [name] is an 83 year old male with asthma and allergies who presents with persistent throat mucus production.  He has been experiencing persistent mucus production in the back of his throat for the past two months. The mucus is described as thick and gray, and it is more pronounced in the evenings. He feels the need to 'spit up' the mucus but has no cough. No shortness of breath, heartburn, reflux, nausea, vomiting, or abdominal pain.  [facility] with a provider about this. He was given Tessalon, although he does not have a cough.  He quit smoking forty years ago. A chest x-ray from 2020 was normal. He has not tried any over-the-counter medications for his symptoms.     Additionally, he reports a red, sore area to his left buttocks.  He reports he noticed this a month and a half ago.  The area is tender but he has not noticed any drainage from it.  Review of Systems   Past Medical History:  Diagnosis Date   Diabetes mellitus without complication (HCC)    type 2. has not taken meds in two years    Heart murmur    has had for years   Hypertension    Stone, bladder     Current Outpatient Medications  Medication Sig Dispense Refill   amLODipine (NORVASC) 10 MG tablet Take 1 tablet (10 mg total) by mouth daily. 90 tablet 3   aspirin 81 MG chewable tablet Chew 81 mg by mouth daily.     Blood Glucose Monitoring Suppl (ONE TOUCH ULTRA 2) w/Device KIT OneTouch Ultra2 Meter kit     Cinnamon Bark POWD Take 1 Scoop by mouth daily with breakfast.      diclofenac Sodium (VOLTAREN) 1 % GEL Apply topically.     glipiZIDE (GLUCOTROL XL) 2.5 MG 24 hr tablet      hydrochlorothiazide (HYDRODIURIL) 25 MG tablet Take 1  tablet (25 mg total) by mouth daily. 90 tablet 3   HYDROcodone-acetaminophen (NORCO/VICODIN) 5-325 MG tablet  (Patient not taking: Reported on 01/14/2024)     losartan (COZAAR) 50 MG tablet Take 1 tablet by mouth daily.     naproxen sodium (ALEVE) 220 MG tablet Take 220 mg by mouth 2 (two) times daily as needed.     ONETOUCH ULTRA test strip Use to check blood sugar 1 x daily 100 each 3   telmisartan (MICARDIS) 80 MG tablet Take 1 tablet (80 mg total) by mouth daily. 90 tablet 3   triamcinolone cream (KENALOG) 0.1 % SMARTSIG:1 Application Topical 2-3 Times Daily     No current facility-administered medications for this visit.    Allergies  Allergen Reactions   Lisinopril Cough    Family History  Problem Relation Age of Onset   Alzheimer's disease Sister    Heart attack Brother    Lung cancer Son     Social History   Socioeconomic History   Marital status: Widowed    Spouse name: Kennon Rounds   Number of children: 5   Years of education: Not on file   Highest education level: 10th grade  Occupational History   Occupation: worked at [employer] for 46 yrs  Comment: retired  Tobacco Use   Smoking status: Former    Current packs/day: 0.00    Types: Cigarettes    Quit date: 1956    Years since quitting: 69.2   Smokeless tobacco: Former    Types: Chew    Quit date: 2005   Tobacco comments:    83 years old for 1 year  Vaping Use   Vaping status: Never Used  Substance and Sexual Activity   Alcohol use: No    Alcohol/week: 0.0 standard drinks of alcohol   Drug use: No   Sexual activity: Not on file  Other Topics Concern   Not on file  Social History Narrative   Not on file   Social Drivers of Health   Financial Resource Strain: Low Risk  (08/15/2022)   Overall Financial Resource Strain (CARDIA)    Difficulty of Paying Living Expenses: Not hard at all  Food Insecurity: No Food Insecurity (08/15/2022)   Hunger Vital Sign    Worried About Running Out of Food in the  Last Year: Never true    Ran Out of Food in the Last Year: Never true  Transportation Needs: No Transportation Needs (08/15/2022)   PRAPARE - Administrator, Civil Service (Medical): No    Lack of Transportation (Non-Medical): No  Physical Activity: Sufficiently Active (08/15/2022)   Exercise Vital Sign    Days of Exercise per Week: 4 days    Minutes of Exercise per Session: 40 min  Stress: No Stress Concern Present (08/15/2022)   [employer] of Occupational Health - Occupational Stress Questionnaire    Feeling of Stress : Not at all  Social Connections: Moderately Isolated (08/15/2022)   Social Connection and Isolation Panel [NHANES]    Frequency of Communication with Friends and Family: More than three times a week    Frequency of Social Gatherings with Friends and Family: More than three times a week    Attends Religious Services: More than 4 times per year    Active Member of [employer] or Organizations: No    Attends Banker Meetings: Never    Marital Status: Widowed  Intimate Partner Violence: Not At Risk (08/15/2022)   Humiliation, Afraid, Rape, and Kick questionnaire    Fear of Current or Ex-Partner: No    Emotionally Abused: No    Physically Abused: No    Sexually Abused: No     Constitutional: Denies fever, malaise, fatigue, headache or abrupt weight changes.  HEENT: Pt reports runny nose. Denies eye pain, eye redness, ear pain, ringing in the ears, wax buildup, nasal congestion, bloody nose, or sore throat. Respiratory: Patient reports sputum production.  Denies difficulty breathing, shortness of breath, or cough.   Cardiovascular: Denies chest pain, chest tightness, palpitations or swelling in the hands or feet.  Gastrointestinal: Denies abdominal pain, bloating, constipation, diarrhea or blood in the stool.  Skin: Patient reports redness of left buttock.  Denies rashes, lesions or ulcercations.    No other specific complaints in a complete review  of systems (except as listed in HPI above).      Objective:   Physical Exam  BP (!) 148/80   Pulse 82   Ht 5\' 6"  (1.676 m)   Wt 170 lb 12.8 oz (77.5 kg)   SpO2 98%   BMI 27.57 kg/m   Wt Readings from Last 3 Encounters:  01/14/24 172 lb 6.4 oz (78.2 kg)  08/15/22 173 lb (78.5 kg)  07/18/22 173 lb 3.2 oz (78.6 kg)  General: Appears his stated age, ill-appearing, overweight in NAD. Skin: Warm, dry and intact.  Area of redness noted at the left gluteal fold with slight thickening of the skin concerning for the start of a pressure ulcer however there is no breakdown noted. HEENT: Head: normal shape and size, no sinus tenderness noted; Eyes: sclera white, no icterus, conjunctiva pink, PERRLA and EOMs intact; Nose: mucosa pink and moist, septum midline; Throat/Mouth: Teeth present, mucosa pink and moist, + PND, no exudate, lesions or ulcerations noted.  Neck: No adenopathy noted. Cardiovascular: Normal rate and rhythm.  Murmur noted. Pulmonary/Chest: Normal effort and positive vesicular breath sounds. No respiratory distress.  Possible crackles noted in the LLL.  No wheezes or ronchi noted.  Abdomen: Soft and nontender. Normal bowel sounds.  Musculoskeletal: Kyphotic.  Walks hunched over.  Gait slow and steady with use of cane. Neurological: Alert and oriented.   BMET    Component Value Date/Time   NA 139 05/06/2021 0947   K 4.1 05/06/2021 0947   CL 100 05/06/2021 0947   CO2 23 05/06/2021 0947   GLUCOSE 106 (H) 05/06/2021 0947   GLUCOSE 137 (H) 01/19/2019 0434   BUN 23 05/06/2021 0947   CREATININE 0.84 05/06/2021 0947   CALCIUM 9.5 05/06/2021 0947   GFRNONAA 94 10/17/2020 0819   GFRAA 109 10/17/2020 0819    Lipid Panel     Component Value Date/Time   CHOL 114 05/06/2021 0947   TRIG 56 05/06/2021 0947   HDL 36 (L) 05/06/2021 0947   CHOLHDL 3.2 05/06/2021 0947   LDLCALC 65 05/06/2021 0947    CBC    Component Value Date/Time   WBC 5.9 05/28/2020 0831   WBC 6.2  01/19/2019 0434   RBC 4.75 05/28/2020 0831   RBC 4.35 01/19/2019 0434   HGB 14.4 05/28/2020 0831   HCT 43.1 05/28/2020 0831   PLT 176 05/28/2020 0831   MCV 91 05/28/2020 0831   MCH 30.3 05/28/2020 0831   MCH 30.1 01/19/2019 0434   MCHC 33.4 05/28/2020 0831   MCHC 34.0 01/19/2019 0434   RDW 12.3 05/28/2020 0831   LYMPHSABS 1.7 05/28/2020 0831   MONOABS 0.6 01/18/2019 1133   EOSABS 0.1 05/28/2020 0831   BASOSABS 0.1 05/28/2020 0831    Hgb A1C Lab Results  Component Value Date   HGBA1C 4.9 11/11/2021            Assessment & Plan:   Assessment and Plan    Chronic sputum production, asthma, allergies: Daily thick, gray sputum for two months suggests bronchial origin. Abnormal left lung auscultation noted. - Order chest x-ray to evaluate lung condition. - Prescribe fexofenadine 180 mg once daily at bedtime.  -Will follow-up once we get the results of your chest x-ray with further recommendation and treatment plan  Pressure injury left buttock: No open areas no drainage per his report.  He reports he is sitting most of the day -Advised him to change positions every 2 hours -Recommend placing a pillow under the left side of his buttocks and alternate by switching this to the right frequently -He can put something over this area such as Desitin to prevent breakdown   Follow-up with your PCP as previously scheduled Nicki Reaper, NP

## 2024-07-19 ENCOUNTER — Encounter: Admitting: Family Medicine

## 2024-07-19 NOTE — Progress Notes (Deleted)
 New patient visit   Patient: [name]   DOB: [birth year]   83 y.o. Male  MRN: [id number] Visit Date: 07/19/2024  Today's healthcare provider: Rockie Agent, MD   No chief complaint on file.  Subjective    [name] is a 83 y.o. male who presents today as a new patient to establish care.      Discussed the use of AI scribe software for clinical note transcription with the patient, who gave verbal consent to proceed.  History of Present Illness       Past Medical History:  Diagnosis Date   Diabetes mellitus without complication (HCC)    type 2. has not taken meds in two years    Heart murmur    has had for years   Hypertension    Stone, bladder     Outpatient Medications Prior to Visit  Medication Sig   amLODipine  (NORVASC ) 10 MG tablet Take 1 tablet (10 mg total) by mouth daily.   aspirin 81 MG chewable tablet Chew 81 mg by mouth daily.   benzonatate (TESSALON) 100 MG capsule Take 100 mg by mouth.   Blood Glucose Monitoring Suppl (ONE TOUCH ULTRA 2) w/Device KIT OneTouch Ultra2 Meter kit   Cinnamon Bark POWD Take 1 Scoop by mouth daily with breakfast.    diclofenac Sodium (VOLTAREN) 1 % GEL Apply topically. (Patient not taking: Reported on 02/16/2024)   fexofenadine  (ALLEGRA ) 180 MG tablet Take 1 tablet (180 mg total) by mouth at bedtime.   glipiZIDE (GLUCOTROL XL) 2.5 MG 24 hr tablet  (Patient not taking: Reported on 02/16/2024)   hydrochlorothiazide  (HYDRODIURIL ) 25 MG tablet Take 1 tablet (25 mg total) by mouth daily.   HYDROcodone -acetaminophen  (NORCO/VICODIN) 5-325 MG tablet  (Patient not taking: Reported on 02/16/2024)   losartan  (COZAAR ) 50 MG tablet Take 1 tablet by mouth daily.   naproxen sodium (ALEVE) 220 MG tablet Take 220 mg by mouth 2 (two) times daily as needed. (Patient not taking: Reported on 02/16/2024)   ONETOUCH ULTRA test strip Use to check blood sugar 1 x daily   telmisartan  (MICARDIS ) 80 MG tablet Take 1 tablet (80 mg  total) by mouth daily.   triamcinolone cream (KENALOG) 0.1 % SMARTSIG:1 Application Topical 2-3 Times Daily (Patient not taking: Reported on 02/16/2024)   No facility-administered medications prior to visit.    Past Surgical History:  Procedure Laterality Date   CATARACT EXTRACTION, BILATERAL     CHOLECYSTECTOMY  04/1995   CYSTOSCOPY WITH LITHOLAPAXY  02/11/2019   Procedure: CYSTOSCOPY WITH LITHOLAPAXY;  Surgeon: Francisca Redell BROCKS, MD;  Location: [facility];  Service: Urology;;   EYE SURGERY     HOLEP-LASER ENUCLEATION OF THE PROSTATE WITH MORCELLATION N/A 02/11/2019   Procedure: HOLEP-LASER ENUCLEATION OF THE PROSTATE WITH MORCELLATION;  Surgeon: Francisca Redell BROCKS, MD;  Location: [facility];  Service: Urology;  Laterality: N/A;   LIPOMA EXCISION  01/28/2011   Family Status  Relation Name Status   Mother  Deceased at age 67       unsure   Father  Deceased at age 2       unsure   Sister 1 Alive   Sister 2 Alive   Sister 3 Deceased   Sister 4 Alive   Sister 5 Alive   Brother 1 Deceased   Brother 2 Alive   Brother 3 Alive   Brother 4 Alive   Brother  Alive   Son  Alive   Son  Alive  Son  Alive   Son  Alive  No partnership data on file   Family History  Problem Relation Age of Onset   Alzheimer's disease Sister    Heart attack Brother    Lung cancer Son    Social History   Socioeconomic History   Marital status: Widowed    Spouse name: Ginnie   Number of children: 5   Years of education: Not on file   Highest education level: 10th grade  Occupational History   Occupation: worked at [employer] for 46 yrs    Comment: retired  Tobacco Use   Smoking status: Former    Current packs/day: 0.00    Types: Cigarettes    Quit date: 1956    Years since quitting: 69.6   Smokeless tobacco: Former    Types: Chew    Quit date: 2005   Tobacco comments:    83 years old for 1 year  Vaping Use   Vaping status: Never Used  Substance and Sexual Activity   Alcohol use: No     Alcohol/week: 0.0 standard drinks of alcohol   Drug use: No   Sexual activity: Not on file  Other Topics Concern   Not on file  Social History Narrative   Not on file   Social Drivers of Health   Financial Resource Strain: Low Risk  (08/15/2022)   Overall Financial Resource Strain (CARDIA)    Difficulty of Paying Living Expenses: Not hard at all  Food Insecurity: No Food Insecurity (08/15/2022)   Hunger Vital Sign    Worried About Running Out of Food in the Last Year: Never true    Ran Out of Food in the Last Year: Never true  Transportation Needs: No Transportation Needs (08/15/2022)   PRAPARE - Administrator, Civil Service (Medical): No    Lack of Transportation (Non-Medical): No  Physical Activity: Sufficiently Active (08/15/2022)   Exercise Vital Sign    Days of Exercise per Week: 4 days    Minutes of Exercise per Session: 40 min  Stress: No Stress Concern Present (08/15/2022)   [employer] of Occupational Health - Occupational Stress Questionnaire    Feeling of Stress : Not at all  Social Connections: Moderately Isolated (08/15/2022)   Social Connection and Isolation Panel    Frequency of Communication with Friends and Family: More than three times a week    Frequency of Social Gatherings with Friends and Family: More than three times a week    Attends Religious Services: More than 4 times per year    Active Member of [employer] or Organizations: No    Attends Banker Meetings: Never    Marital Status: Widowed     Allergies  Allergen Reactions   Lisinopril Cough    Immunization History  Administered Date(s) Administered   Fluad Quad(high Dose 65+) 08/10/2019, 10/16/2020, 11/11/2021   Influenza, High Dose Seasonal PF 11/14/2016, 09/17/2018   Influenza, Seasonal, Injecte, Preservative Fre 09/20/2012   Influenza-Unspecified 09/17/2017   Pneumococcal Conjugate-13 11/27/2017   Tdap 06/13/2016    Health Maintenance  Topic Date Due   Zoster  Vaccines- Shingrix (1 of 2) Never done   Pneumococcal Vaccine: 50+ Years (2 of 2 - PPSV23, PCV20, or PCV21) 01/22/2018   Diabetic kidney evaluation - Urine ACR  11/27/2018   Diabetic kidney evaluation - eGFR measurement  05/06/2022   HEMOGLOBIN A1C  05/12/2022   OPHTHALMOLOGY EXAM  02/13/2023   FOOT EXAM  04/17/2023  COVID-19 Vaccine (1 - 2024-25 season) Never done   INFLUENZA VACCINE  07/01/2024   Medicare Annual Wellness (AWV)  04/13/2025   DTaP/Tdap/Td (2 - Td or Tdap) 06/13/2026   HPV VACCINES  Aged Out   Meningococcal B Vaccine  Aged Out   Pneumococcal Vaccine  Discontinued    Patient Care Team: Edman Marsa PARAS, DO as PCP - General (Family Medicine) Darliss Rogue, MD as PCP - Cardiology (Cardiology) Elma Zachary RAMAN as Consulting Physician (Optometry)  Review of Systems  {Insert previous labs (optional):23779} {See past labs  Heme  Chem  Endocrine  Serology  Results Review (optional):1}   Objective    There were no vitals taken for this visit. {Insert last BP/Wt (optional):23777}{See vitals history (optional):1}    Depression Screen    02/16/2024    8:09 AM 08/15/2022   10:56 AM 04/30/2021   11:04 AM 12/04/2020   10:31 AM  PHQ 2/9 Scores  PHQ - 2 Score 0 0 0 0  PHQ- 9 Score  0 0    No results found for any visits on 07/19/24.   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      Rockie Agent, MD  [facility] [phone] (phone) [phone] (fax)  [facility]

## 2024-09-30 ENCOUNTER — Ambulatory Visit (INDEPENDENT_AMBULATORY_CARE_PROVIDER_SITE_OTHER): Admitting: Family Medicine

## 2024-09-30 ENCOUNTER — Encounter: Payer: Self-pay | Admitting: Family Medicine

## 2024-09-30 VITALS — BP 133/76 | HR 75 | Temp 98.1°F | Ht 66.0 in | Wt 171.9 lb

## 2024-09-30 DIAGNOSIS — I350 Nonrheumatic aortic (valve) stenosis: Secondary | ICD-10-CM

## 2024-09-30 DIAGNOSIS — G8929 Other chronic pain: Secondary | ICD-10-CM

## 2024-09-30 DIAGNOSIS — B353 Tinea pedis: Secondary | ICD-10-CM

## 2024-09-30 DIAGNOSIS — Z0000 Encounter for general adult medical examination without abnormal findings: Secondary | ICD-10-CM

## 2024-09-30 DIAGNOSIS — E11A Type 2 diabetes mellitus without complications in remission: Secondary | ICD-10-CM | POA: Diagnosis not present

## 2024-09-30 DIAGNOSIS — I10 Essential (primary) hypertension: Secondary | ICD-10-CM

## 2024-09-30 DIAGNOSIS — M545 Low back pain, unspecified: Secondary | ICD-10-CM

## 2024-09-30 MED ORDER — KETOCONAZOLE 2 % EX CREA
1.0000 | TOPICAL_CREAM | Freq: Every day | CUTANEOUS | 0 refills | Status: AC
Start: 2024-09-30 — End: ?

## 2024-09-30 MED ORDER — TELMISARTAN 80 MG PO TABS
80.0000 mg | ORAL_TABLET | Freq: Every day | ORAL | 3 refills | Status: AC
Start: 2024-09-30 — End: ?

## 2024-09-30 MED ORDER — HYDROCHLOROTHIAZIDE 25 MG PO TABS
25.0000 mg | ORAL_TABLET | Freq: Every day | ORAL | 3 refills | Status: AC
Start: 2024-09-30 — End: ?

## 2024-09-30 MED ORDER — AMLODIPINE BESYLATE 10 MG PO TABS
10.0000 mg | ORAL_TABLET | Freq: Every day | ORAL | 3 refills | Status: AC
Start: 2024-09-30 — End: ?

## 2024-09-30 NOTE — Progress Notes (Signed)
 New patient visit   Patient: [name]   DOB: 1941/06/27   83 y.o. Male  MRN: [id number] Visit Date: 09/30/2024  Today's healthcare provider: LAURAINE LOISE BUOY, DO   Chief Complaint  Patient presents with   Transitions Of Care    Patient is here for a transfer of care, unhappy with previous provider.  Diabetic Eye Exam- [facility], about 6 months ago.  Sent request for records.   Subjective    [name] is a 83 y.o. male who presents today as a new patient to establish care.  HPI HPI     Transitions Of Care    Additional comments: Patient is here for a transfer of care, unhappy with previous provider.  Diabetic Eye Exam- [facility], about 6 months ago.  Sent request for records.      Last edited by Terrel Powell CROME, CMA on 09/30/2024  9:17 AM.       [name] is an 83 year old male who presents for a routine follow-up.  He has a history of diabetes, which is currently in remission with normal hemoglobin A1c levels for about a decade due to dietary changes. He does not take any medications for diabetes.  His hypertension is managed with telmisartan , hydrochlorothiazide , and amlodipine .   He has a history of a heart murmur and aortic stenosis. He reports that after a recent two-week heart monitoring period, he was told everything was fine except for the heart murmur.   He experiences low back pain, which he manages by walking, stating it helps alleviate discomfort. He uses a cane for balance and has not experienced any recent falls.  He has a history of a foot injury causing numbness and tingling. He has not soaked his feet recently.  No respiratory issues are reported, and he denies history of asthma. No trouble breathing.  He lives alone with his Chihuahua and receives regular, daily visits from his son and daughter. He maintains an active lifestyle, attending church events and cooking his meals.     Past Medical History:  Diagnosis Date    Diabetes mellitus without complication (HCC)    type 2. has not taken meds in two years    Heart murmur    has had for years   Hypertension    Stone, bladder    Past Surgical History:  Procedure Laterality Date   CATARACT EXTRACTION, BILATERAL     CHOLECYSTECTOMY  04/1995   CYSTOSCOPY WITH LITHOLAPAXY  02/11/2019   Procedure: CYSTOSCOPY WITH LITHOLAPAXY;  Surgeon: Francisca Redell BROCKS, MD;  Location: [facility];  Service: Urology;;   EYE SURGERY     HOLEP-LASER ENUCLEATION OF THE PROSTATE WITH MORCELLATION N/A 02/11/2019   Procedure: HOLEP-LASER ENUCLEATION OF THE PROSTATE WITH MORCELLATION;  Surgeon: Francisca Redell BROCKS, MD;  Location: [facility];  Service: Urology;  Laterality: N/A;   LIPOMA EXCISION  01/28/2011   Family Status  Relation Name Status   Mother  Deceased at age 29       unsure   Father  Deceased at age 28       unsure   Sister 1 Alive   Sister 2 Alive   Sister 3 Deceased   Sister 4 Alive   Sister 5 Alive   Brother 1 Deceased   Brother 2 Alive   Brother 3 Alive   Brother 4 Alive   Brother  Alive   Son  Alive   Son  Alive   Son  Alive   Son  Alive  No partnership data on file   Family History  Problem Relation Age of Onset   Alzheimer's disease Sister    Heart attack Brother    Lung cancer Son    Social History   Socioeconomic History   Marital status: Widowed    Spouse name: Ginnie   Number of children: 5   Years of education: Not on file   Highest education level: 10th grade  Occupational History   Occupation: worked at [employer] for 46 yrs    Comment: retired  Tobacco Use   Smoking status: Former    Current packs/day: 0.00    Types: Cigarettes    Quit date: 1956    Years since quitting: 69.8   Smokeless tobacco: Former    Types: Chew    Quit date: 2005   Tobacco comments:    83 years old for 1 year  Vaping Use   Vaping status: Never Used  Substance and Sexual Activity   Alcohol use: No    Alcohol/week: 0.0 standard drinks of alcohol    Drug use: No   Sexual activity: Not on file  Other Topics Concern   Not on file  Social History Narrative   Not on file   Social Drivers of Health   Financial Resource Strain: Low Risk  (08/15/2022)   Overall Financial Resource Strain (CARDIA)    Difficulty of Paying Living Expenses: Not hard at all  Food Insecurity: No Food Insecurity (08/15/2022)   Hunger Vital Sign    Worried About Running Out of Food in the Last Year: Never true    Ran Out of Food in the Last Year: Never true  Transportation Needs: No Transportation Needs (08/15/2022)   PRAPARE - Administrator, Civil Service (Medical): No    Lack of Transportation (Non-Medical): No  Physical Activity: Sufficiently Active (08/15/2022)   Exercise Vital Sign    Days of Exercise per Week: 4 days    Minutes of Exercise per Session: 40 min  Stress: No Stress Concern Present (08/15/2022)   [employer] of Occupational Health - Occupational Stress Questionnaire    Feeling of Stress : Not at all  Social Connections: Moderately Isolated (08/15/2022)   Social Connection and Isolation Panel    Frequency of Communication with Friends and Family: More than three times a week    Frequency of Social Gatherings with Friends and Family: More than three times a week    Attends Religious Services: More than 4 times per year    Active Member of [employer] or Organizations: No    Attends Banker Meetings: Never    Marital Status: Widowed   Outpatient Medications Prior to Visit  Medication Sig   aspirin 81 MG chewable tablet Chew 81 mg by mouth daily.   Blood Glucose Monitoring Suppl (ONE TOUCH ULTRA 2) w/Device KIT OneTouch Ultra2 Meter kit   Cinnamon Bark POWD Take 1 Scoop by mouth daily with breakfast.    fexofenadine  (ALLEGRA ) 180 MG tablet Take 1 tablet (180 mg total) by mouth at bedtime. (Patient taking differently: Take 180 mg by mouth as needed for allergies.)   ONETOUCH ULTRA test strip Use to check blood sugar 1 x  daily   triamcinolone cream (KENALOG) 0.1 % SMARTSIG:1 Application Topical 2-3 Times Daily (Patient taking differently: Apply 1 Application topically as needed.)   [DISCONTINUED] amLODipine  (NORVASC ) 10 MG tablet Take 1 tablet (10 mg total) by mouth daily.   [  DISCONTINUED] hydrochlorothiazide  (HYDRODIURIL ) 25 MG tablet Take 1 tablet (25 mg total) by mouth daily.   [DISCONTINUED] telmisartan  (MICARDIS ) 80 MG tablet Take 1 tablet (80 mg total) by mouth daily.   [DISCONTINUED] benzonatate (TESSALON) 100 MG capsule Take 100 mg by mouth.   [DISCONTINUED] diclofenac Sodium (VOLTAREN) 1 % GEL Apply topically. (Patient not taking: Reported on 02/16/2024)   [DISCONTINUED] glipiZIDE (GLUCOTROL XL) 2.5 MG 24 hr tablet  (Patient not taking: Reported on 02/16/2024)   [DISCONTINUED] HYDROcodone -acetaminophen  (NORCO/VICODIN) 5-325 MG tablet  (Patient not taking: Reported on 02/16/2024)   [DISCONTINUED] losartan  (COZAAR ) 50 MG tablet Take 1 tablet by mouth daily. (Patient not taking: Reported on 09/30/2024)   [DISCONTINUED] naproxen sodium (ALEVE) 220 MG tablet Take 220 mg by mouth 2 (two) times daily as needed. (Patient not taking: Reported on 02/16/2024)   No facility-administered medications prior to visit.   Allergies  Allergen Reactions   Lisinopril Cough    Immunization History  Administered Date(s) Administered   Fluad Quad(high Dose 65+) 08/10/2019, 10/16/2020, 11/11/2021   INFLUENZA, HIGH DOSE SEASONAL PF 11/14/2016, 09/17/2018   Influenza, Seasonal, Injecte, Preservative Fre 09/20/2012   Influenza-Unspecified 09/17/2017   Pneumococcal Conjugate-13 11/27/2017   Tdap 06/13/2016    Health Maintenance  Topic Date Due   Diabetic kidney evaluation - Urine ACR  11/27/2018   Diabetic kidney evaluation - eGFR measurement  05/06/2022   HEMOGLOBIN A1C  05/12/2022   OPHTHALMOLOGY EXAM  10/31/2024 (Originally 02/13/2023)   COVID-19 Vaccine (1 - 2025-26 season) 12/01/2024 (Originally 08/01/2024)   Zoster  Vaccines- Shingrix (1 of 2) 12/31/2024 (Originally 08/06/1989)   Influenza Vaccine  02/28/2025 (Originally 07/01/2024)   Pneumococcal Vaccine: 50+ Years (2 of 2 - PPSV23, PCV20, or PCV21) 09/30/2025 (Originally 01/22/2018)   Medicare Annual Wellness (AWV)  04/13/2025   FOOT EXAM  09/30/2025   DTaP/Tdap/Td (2 - Td or Tdap) 06/13/2026   Meningococcal B Vaccine  Aged Out    Patient Care Team: Henretta Quist, Lauraine SAILOR, DO as PCP - General (Family Medicine) Darliss Rogue, MD as PCP - Cardiology (Cardiology) Elma Zachary RAMAN as Consulting Physician (Optometry)  Review of Systems  Constitutional:  Negative for appetite change, chills, fatigue and fever.  HENT:  Negative for congestion, ear pain, hearing loss, nosebleeds, tinnitus and trouble swallowing.   Eyes:  Negative for pain and visual disturbance.  Respiratory:  Negative for cough, chest tightness and shortness of breath.   Cardiovascular:  Negative for chest pain, palpitations and leg swelling.  Gastrointestinal:  Negative for abdominal pain, blood in stool, constipation, diarrhea, nausea and vomiting.  Endocrine: Negative for polydipsia, polyphagia and polyuria.  Genitourinary:  Negative for dysuria, flank pain and hematuria.  Musculoskeletal:  Positive for back pain (low). Negative for arthralgias, joint swelling, myalgias and neck stiffness.  Skin:  Negative for color change, rash and wound.  Neurological:  Negative for dizziness, tremors, seizures, speech difficulty, weakness, light-headedness and headaches.  Psychiatric/Behavioral:  Negative for behavioral problems, confusion, decreased concentration, dysphoric mood and sleep disturbance. The patient is not nervous/anxious.   All other systems reviewed and are negative.       Objective    BP 133/76 (BP Location: Right Arm, Patient Position: Sitting, Cuff Size: Normal)   Pulse 75   Temp 98.1 F (36.7 C) (Oral)   Ht 5' 6 (1.676 m)   Wt 171 lb 14.4 oz (78 kg)   SpO2 98%   BMI  27.75 kg/m     Physical Exam Vitals and nursing note reviewed.  Constitutional:  General: He is awake.     Appearance: Normal appearance.  HENT:     Head: Normocephalic and atraumatic.     Right Ear: Ear canal and external ear normal. Tympanic membrane is scarred.     Left Ear: Ear canal and external ear normal. Tympanic membrane is scarred.     Nose: Nose normal.     Mouth/Throat:     Mouth: Mucous membranes are moist.     Pharynx: Oropharynx is clear. No oropharyngeal exudate or posterior oropharyngeal erythema.  Eyes:     General: No scleral icterus.    Extraocular Movements: Extraocular movements intact.     Conjunctiva/sclera: Conjunctivae normal.     Pupils: Pupils are equal, round, and reactive to light.  Neck:     Thyroid : No thyromegaly or thyroid  tenderness.  Cardiovascular:     Rate and Rhythm: Normal rate and regular rhythm.     Pulses: Normal pulses.     Heart sounds: Murmur heard.  Pulmonary:     Effort: Pulmonary effort is normal. No tachypnea, bradypnea or respiratory distress.     Breath sounds: Normal breath sounds. No stridor. No wheezing, rhonchi or rales.  Abdominal:     General: Bowel sounds are normal. There is no distension.     Palpations: Abdomen is soft. There is no mass.     Tenderness: There is no abdominal tenderness. There is no guarding.     Hernia: No hernia is present.  Musculoskeletal:     Cervical back: Normal range of motion and neck supple.     Right lower leg: No edema.     Left lower leg: No edema.  Lymphadenopathy:     Cervical: No cervical adenopathy.  Skin:    General: Skin is warm and dry.  Neurological:     Mental Status: He is alert and oriented to person, place, and time. Mental status is at baseline.  Psychiatric:        Mood and Affect: Mood normal.        Behavior: Behavior normal.     Depression Screen    02/16/2024    8:09 AM 08/15/2022   10:56 AM 04/30/2021   11:04 AM 12/04/2020   10:31 AM  PHQ 2/9 Scores   PHQ - 2 Score 0 0 0 0  PHQ- 9 Score  0 0    No results found for any visits on 09/30/24.  Assessment & Plan     Encounter for medical examination to establish care  Essential (primary) hypertension -     Comprehensive metabolic panel with GFR -     Lipid Panel With LDL/HDL Ratio -     Telmisartan ; Take 1 tablet (80 mg total) by mouth daily.  Dispense: 90 tablet; Refill: 3 -     hydroCHLOROthiazide ; Take 1 tablet (25 mg total) by mouth daily.  Dispense: 90 tablet; Refill: 3 -     amLODIPine  Besylate; Take 1 tablet (10 mg total) by mouth daily.  Dispense: 90 tablet; Refill: 3 -     Microalbumin / creatinine urine ratio  Type 2 diabetes mellitus in remission -     Hemoglobin A1c -     Lipid Panel With LDL/HDL Ratio -     Microalbumin / creatinine urine ratio  Aortic valve stenosis, etiology of cardiac valve disease unspecified  Tinea pedis of right foot -     Ketoconazole; Apply 1 Application topically daily. Between 3rd, 4th and 5th toes of right foot  Dispense: 15  g; Refill: 0  Chronic midline low back pain without sciatica     Encounter for medical examination to establish care Routine adult wellness visit for 83 year old male with no acute concerns.  Physical exam overall unremarkable except as noted above. Routine lab work ordered as noted.   - Order blood work.  Essential hypertension Chronic, stable. Well-controlled hypertension managed with telmisartan , hydrochlorothiazide , and amlodipine . - Send refills for telmisartan , hydrochlorothiazide , and amlodipine . No changes today.  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 Recheck A1c today.  Aortic valve stenosis, etiology of cardiac valve disease unspecified Aortic stenosis with recent evaluation showing only a heart murmur. No symptoms present.  Chronic midline low back pain without sciatica Chronic low back pain  managed with walking, which alleviates the pain.  Tinea pedis of right foot Interdigital mycosis with mild infection noted. - Prescribe topical cream for interdigital mycosis.    Return in about 6 months (around 03/30/2025) for Chronic f/u.     I discussed the assessment and treatment plan with the patient  The patient was provided an opportunity to ask questions and all were answered. The patient agreed with the plan and demonstrated an understanding of the instructions.   The patient was advised to call back or seek an in-person evaluation if the symptoms worsen or if the condition fails to improve as anticipated.    LAURAINE LOISE BUOY, DO  [facility] [phone] (phone) [phone] (fax)  [facility]

## 2024-10-01 LAB — LIPID PANEL WITH LDL/HDL RATIO
Cholesterol, Total: 142 mg/dL (ref 100–199)
HDL: 41 mg/dL (ref >39)
LDL Chol Calc (NIH): 83 mg/dL (ref 0–99)
LDL/HDL Ratio: 2 ratio (ref 0.0–3.6)
Triglycerides: 95 mg/dL (ref 0–149)
VLDL Cholesterol Cal: 18 mg/dL (ref 5–40)

## 2024-10-01 LAB — COMPREHENSIVE METABOLIC PANEL WITH GFR
ALT: 16 IU/L (ref 0–44)
AST: 20 IU/L (ref 0–40)
Albumin: 4.4 g/dL (ref 3.7–4.7)
Alkaline Phosphatase: 77 IU/L (ref 48–129)
BUN/Creatinine Ratio: 16 (ref 10–24)
BUN: 14 mg/dL (ref 8–27)
Bilirubin Total: 1.1 mg/dL (ref 0.0–1.2)
CO2: 26 mmol/L (ref 20–29)
Calcium: 10.2 mg/dL (ref 8.6–10.2)
Chloride: 98 mmol/L (ref 96–106)
Creatinine, Ser: 0.85 mg/dL (ref 0.76–1.27)
Globulin, Total: 2.6 g/dL (ref 1.5–4.5)
Glucose: 135 mg/dL — ABNORMAL HIGH (ref 70–99)
Potassium: 4.1 mmol/L (ref 3.5–5.2)
Sodium: 139 mmol/L (ref 134–144)
Total Protein: 7 g/dL (ref 6.0–8.5)
eGFR: 85 mL/min/1.73 (ref >59)

## 2024-10-01 LAB — MICROALBUMIN / CREATININE URINE RATIO
Creatinine, Urine: 70.1 mg/dL
Microalb/Creat Ratio: 89 mg/g{creat} — ABNORMAL HIGH (ref 0–29)
Microalbumin, Urine: 62.6 ug/mL

## 2024-10-01 LAB — HEMOGLOBIN A1C
Est. average glucose Bld gHb Est-mCnc: 117 mg/dL
Hgb A1c MFr Bld: 5.7 % — ABNORMAL HIGH (ref 4.8–5.6)

## 2024-10-10 ENCOUNTER — Telehealth: Payer: Self-pay

## 2024-10-10 NOTE — Telephone Encounter (Signed)
 Please review and advise.

## 2024-10-10 NOTE — Telephone Encounter (Signed)
 Copied from CRM [id number]. Topic: Clinical - Medical Advice >> Oct 10, 2024  3:23 PM Lonell PEDLAR wrote: Reason for CRM: Patient's daughter, Aldona, called to report that patient is having an issue with his nerves, and would like something called in. C/b: [phone] or daughter  orma) [phone]

## 2024-10-12 ENCOUNTER — Ambulatory Visit: Payer: Self-pay | Admitting: Family Medicine

## 2024-10-19 NOTE — Telephone Encounter (Signed)
 Appt has been scheduled for Friday at 4pm.

## 2024-10-21 ENCOUNTER — Ambulatory Visit (INDEPENDENT_AMBULATORY_CARE_PROVIDER_SITE_OTHER): Admitting: Family Medicine

## 2024-10-21 ENCOUNTER — Encounter: Payer: Self-pay | Admitting: Family Medicine

## 2024-10-21 VITALS — BP 144/78 | HR 74 | Temp 98.0°F | Ht 66.0 in | Wt 175.9 lb

## 2024-10-21 DIAGNOSIS — F411 Generalized anxiety disorder: Secondary | ICD-10-CM | POA: Diagnosis not present

## 2024-10-21 MED ORDER — SERTRALINE HCL 25 MG PO TABS: 25.0000 mg | ORAL_TABLET | Freq: Every day | ORAL | 3 refills | Status: DC

## 2024-10-21 NOTE — Progress Notes (Signed)
 Established patient visit   Patient: [name]   DOB: [birth year]   83 y.o. Male  MRN: [id number] Visit Date: 10/21/2024  Today's healthcare provider: LAURAINE LOISE BUOY, DO   Chief Complaint  Patient presents with   Follow-up    Patient is here to speak with provider to see about getting something for his nerves.  Get up in the morning and gets kind of shaky for a while after being up for a little it subsides as he starts off with his day.  Sometimes it makes him like as if he doesn't want to do anything.   Subjective    HPI [name] is an 83 year old male who presents with morning shakiness.  He experiences shakiness and nervousness primarily in the mornings upon waking, which tends to wear off as the day progresses. He does not experience shakiness after naps during the day.  He mentions significant family stressors, including the loss of three family members over the past four years, with the most recent having passed away in 2025-07-20on his (patient's) front porch, which has been particularly distressing.  No falls and reports good balance, although he uses a walker when walking outside and occasionally uses walking sticks and canes. He checks his blood pressure at home, which he states is about 130-140 mmHg, and uses [facility] for his prescriptions.  No thoughts of self-harm or harm to others. He has a small dog that provides companionship during the day, which he feels helps him not to be bothered by shakiness during the day.      Medications: Outpatient Medications Prior to Visit  Medication Sig   amLODipine  (NORVASC ) 10 MG tablet Take 1 tablet (10 mg total) by mouth daily.   aspirin 81 MG chewable tablet Chew 81 mg by mouth daily.   Blood Glucose Monitoring Suppl (ONE TOUCH ULTRA 2) w/Device KIT OneTouch Ultra2 Meter kit   Cinnamon Bark POWD Take 1 Scoop by mouth daily with breakfast.    fexofenadine  (ALLEGRA ) 180 MG tablet Take 1 tablet (180 mg total) by mouth  at bedtime. (Patient taking differently: Take 180 mg by mouth as needed for allergies.)   hydrochlorothiazide  (HYDRODIURIL ) 25 MG tablet Take 1 tablet (25 mg total) by mouth daily.   ketoconazole  (NIZORAL ) 2 % cream Apply 1 Application topically daily. Between 3rd, 4th and 5th toes of right foot   ONETOUCH ULTRA test strip Use to check blood sugar 1 x daily   telmisartan  (MICARDIS ) 80 MG tablet Take 1 tablet (80 mg total) by mouth daily.   triamcinolone cream (KENALOG) 0.1 % SMARTSIG:1 Application Topical 2-3 Times Daily (Patient taking differently: Apply 1 Application topically as needed.)   No facility-administered medications prior to visit.        Objective    BP (!) 144/78 (BP Location: Right Arm, Patient Position: Sitting, Cuff Size: Normal)   Pulse 74   Temp 98 F (36.7 C) (Oral)   Ht 5' 6 (1.676 m)   Wt 175 lb 14.4 oz (79.8 kg)   SpO2 98%   BMI 28.39 kg/m     Physical Exam Vitals and nursing note reviewed.  Constitutional:      General: He is not in acute distress.    Appearance: Normal appearance.  HENT:     Head: Normocephalic and atraumatic.  Eyes:     General: No scleral icterus.    Conjunctiva/sclera: Conjunctivae normal.  Cardiovascular:     Rate and  Rhythm: Normal rate.  Pulmonary:     Effort: Pulmonary effort is normal.  Neurological:     Mental Status: He is alert and oriented to person, place, and time. Mental status is at baseline.  Psychiatric:        Mood and Affect: Mood normal.        Behavior: Behavior normal.      No results found for any visits on 10/21/24.  Assessment & Plan    Generalized anxiety disorder -     Sertraline  HCl; Take 1 tablet (25 mg total) by mouth daily.  Dispense: 30 tablet; Refill: 3   Morning shakiness and nervousness likely due to anxiety, exacerbated by recent family losses. No suicidal/homicidal ideation. - Prescribed sertraline . - Discussed sertraline  side effects: sedation, constipation, appetite changes,  nausea. - Advised to report adverse effects or lack of improvement in 4-8 weeks. - Consider switching to fluoxetine or escitalopram if sertraline  not tolerated. - Follow-up in early January.    Return in about 6 weeks (around 12/02/2024) for MoCA, Anx/Dep.      I discussed the assessment and treatment plan with the patient  The patient was provided an opportunity to ask questions and all were answered. The patient agreed with the plan and demonstrated an understanding of the instructions.   The patient was advised to call back or seek an in-person evaluation if the symptoms worsen or if the condition fails to improve as anticipated.    LAURAINE LOISE BUOY, DO  [facility] [phone] (phone) [phone] (fax)  [facility]

## 2024-11-13 ENCOUNTER — Other Ambulatory Visit: Payer: Self-pay | Admitting: Family Medicine

## 2024-11-13 DIAGNOSIS — F411 Generalized anxiety disorder: Secondary | ICD-10-CM

## 2025-01-16 ENCOUNTER — Ambulatory Visit: Admitting: Family Medicine

## 2025-03-28 ENCOUNTER — Ambulatory Visit: Admitting: Family Medicine
